# Patient Record
Sex: Female | Born: 1980 | Race: White | Hispanic: No | Marital: Married | Smoking: Never smoker
Health system: Southern US, Community
[De-identification: ages and names within clinical notes are randomized; demographics above are authoritative.]

## PROBLEM LIST (undated history)

## (undated) DIAGNOSIS — K589 Irritable bowel syndrome without diarrhea: Secondary | ICD-10-CM

## (undated) DIAGNOSIS — O00109 Unspecified tubal pregnancy without intrauterine pregnancy: Secondary | ICD-10-CM

## (undated) DIAGNOSIS — F419 Anxiety disorder, unspecified: Secondary | ICD-10-CM

## (undated) DIAGNOSIS — J45909 Unspecified asthma, uncomplicated: Secondary | ICD-10-CM

## (undated) DIAGNOSIS — M353 Polymyalgia rheumatica: Secondary | ICD-10-CM

## (undated) DIAGNOSIS — R251 Tremor, unspecified: Secondary | ICD-10-CM

## (undated) DIAGNOSIS — M3500 Sicca syndrome, unspecified: Secondary | ICD-10-CM

## (undated) DIAGNOSIS — K219 Gastro-esophageal reflux disease without esophagitis: Secondary | ICD-10-CM

## (undated) DIAGNOSIS — E063 Autoimmune thyroiditis: Secondary | ICD-10-CM

## (undated) DIAGNOSIS — Z9109 Other allergy status, other than to drugs and biological substances: Secondary | ICD-10-CM

## (undated) DIAGNOSIS — M7918 Myalgia, other site: Secondary | ICD-10-CM

## (undated) HISTORY — PX: OTHER SURGICAL HISTORY: SHX169

## (undated) HISTORY — DX: Sjogren syndrome, unspecified: M35.00

## (undated) HISTORY — DX: Myalgia, other site: M79.18

## (undated) HISTORY — DX: Autoimmune thyroiditis: E06.3

## (undated) HISTORY — DX: Other allergy status, other than to drugs and biological substances: Z91.09

## (undated) HISTORY — DX: Unspecified tubal pregnancy without intrauterine pregnancy: O00.109

## (undated) HISTORY — DX: Polymyalgia rheumatica: M35.3

## (undated) HISTORY — DX: Unspecified asthma, uncomplicated: J45.909

## (undated) HISTORY — DX: Tremor, unspecified: R25.1

## (undated) HISTORY — PX: CHOLECYSTECTOMY: SHX55

## (undated) HISTORY — DX: Irritable bowel syndrome, unspecified: K58.9

---

## 2012-01-16 ENCOUNTER — Encounter (INDEPENDENT_AMBULATORY_CARE_PROVIDER_SITE_OTHER): Payer: Self-pay | Admitting: *Deleted

## 2012-01-31 ENCOUNTER — Encounter (INDEPENDENT_AMBULATORY_CARE_PROVIDER_SITE_OTHER): Payer: Self-pay | Admitting: *Deleted

## 2012-01-31 ENCOUNTER — Encounter (INDEPENDENT_AMBULATORY_CARE_PROVIDER_SITE_OTHER): Payer: Self-pay | Admitting: Internal Medicine

## 2012-01-31 ENCOUNTER — Telehealth (INDEPENDENT_AMBULATORY_CARE_PROVIDER_SITE_OTHER): Payer: Self-pay | Admitting: *Deleted

## 2012-01-31 ENCOUNTER — Other Ambulatory Visit (INDEPENDENT_AMBULATORY_CARE_PROVIDER_SITE_OTHER): Payer: Self-pay | Admitting: *Deleted

## 2012-01-31 ENCOUNTER — Ambulatory Visit (INDEPENDENT_AMBULATORY_CARE_PROVIDER_SITE_OTHER): Payer: Managed Care, Other (non HMO) | Admitting: Internal Medicine

## 2012-01-31 VITALS — BP 114/82 | HR 84 | Temp 98.0°F | Ht 67.5 in | Wt 228.8 lb

## 2012-01-31 DIAGNOSIS — D649 Anemia, unspecified: Secondary | ICD-10-CM

## 2012-01-31 DIAGNOSIS — K625 Hemorrhage of anus and rectum: Secondary | ICD-10-CM

## 2012-01-31 DIAGNOSIS — Z1211 Encounter for screening for malignant neoplasm of colon: Secondary | ICD-10-CM

## 2012-01-31 DIAGNOSIS — J302 Other seasonal allergic rhinitis: Secondary | ICD-10-CM | POA: Insufficient documentation

## 2012-01-31 MED ORDER — PEG-KCL-NACL-NASULF-NA ASC-C 100 G PO SOLR: 1.0000 | Freq: Once | ORAL | Status: DC

## 2012-01-31 NOTE — Patient Instructions (Addendum)
Colonoscopy.  The risks and benefits such as perforation, bleeding, and infection were reviewed with the patient and is agreeable. 

## 2012-01-31 NOTE — Progress Notes (Signed)
Subjective:     Patient ID: Maureen Yates, female   DOB: Apr 19, 1981, 31 y.o.   MRN: [id number]  HPICallie is 31yrold female referred to our office by Dr SManuella Ghazifor rectal bleeding. She had rectal bleeding a month ago. She bled off and on for 4-5 days.  [facility].  Per [facility] records she had a small laceration approx. 7 o'clock at the anal opening.   After than she would see a small amount on the toilet tissue. Stools are usually loose. She usually has a BM about 3-4 a day.  After she eats she will have to go and have a BM.  She has seen her Allergra in her stool undisolved.   Appetite is good. No weight loss. Occasionally epigastric pain and bloating.   Hx of anemia back in 2008 and was corrected with Iron.  LMP 01/22/2012. Periods are not heavy. 12/26/2011: H and H 10.1 and 33.0, MCV 74.  Review of Systems see hpi Current Outpatient Prescriptions  Medication Sig Dispense Refill  . desogestrel-ethinyl estradiol (KARIVA,AZURETTE,MIRCETTE) 0.15-0.02/0.01 MG (21/5) tablet Take 1 tablet by mouth daily.      . ferrous sulfate 325 (65 FE) MG EC tablet Take 325 mg by mouth 3 (three) times daily with meals.      . fexofenadine (ALLEGRA) 180 MG tablet Take 180 mg by mouth daily.      .Marland KitchenFLUoxetine (PROZAC) 20 MG capsule Take 30 mg by mouth daily.      . pantoprazole (PROTONIX) 40 MG tablet Take 40 mg by mouth daily.       Past Medical History  Diagnosis Date  . Environmental allergies    History   Social History  . Marital Status: Married    Spouse Name: N/A    Number of Children: N/A  . Years of Education: N/A   Occupational History  . Not on file.   Social History Main Topics  . Smoking status: Never Smoker   . Smokeless tobacco: Not on file  . Alcohol Use: No  . Drug Use: No  . Sexually Active: Not on file   Other Topics Concern  . Not on file   Social History Narrative  . No narrative on file   Family Status  Relation  Status Death Age  . Mother Alive     good health  . Father Deceased     copd  . Brother Alive     good health   Allergies  Allergen Reactions  . Augmentin (Amoxicillin-Pot Clavulanate)   . Casein         Objective:   Physical Exam Filed Vitals:   01/31/12 1100  Height: 5' 7.5" (1.715 m)  Weight: 228 lb 12.8 oz (103.783 kg)   Alert and oriented. Skin warm and dry. Oral mucosa is moist.   . Sclera anicteric, conjunctivae is pink. Thyroid not enlarged. No cervical lymphadenopathy. Lungs clear. Heart regular rate and rhythm.  Abdomen is soft. Bowel sounds are positive. No hepatomegaly. No abdominal masses felt. Epigastric tenderness slight. Stool brown and guaiac negative. No edema to lower extremities.       Filed Vitals:   01/31/12 1100  Height: 5' 7.5" (1.715 m)  Weight: 228 lb 12.8 oz (103.783 kg)   Current Outpatient Prescriptions  Medication Sig Dispense Refill  . desogestrel-ethinyl estradiol (KARIVA,AZURETTE,MIRCETTE) 0.15-0.02/0.01 MG (21/5) tablet Take 1 tablet by mouth daily.      . ferrous sulfate 325 (65  FE) MG EC tablet Take 325 mg by mouth 3 (three) times daily with meals.      . fexofenadine (ALLEGRA) 180 MG tablet Take 180 mg by mouth daily.      Marland Kitchen FLUoxetine (PROZAC) 20 MG capsule Take 30 mg by mouth daily.      . pantoprazole (PROTONIX) 40 MG tablet Take 40 mg by mouth daily.            Assessment:   Iron deficiency  anemia. Rectal bleeding from a small anal laceration resolved. I discussed this case with Dr. Laural Golden. Colonic neoplasm, polyp, AVM need to be ruled out.    Plan:    Colonoscopy.The risks and benefits such as perforation, bleeding, and infection were reviewed with the patient and is agreeable.

## 2012-01-31 NOTE — Telephone Encounter (Signed)
Patient needs movi prep 

## 2012-02-04 ENCOUNTER — Encounter (INDEPENDENT_AMBULATORY_CARE_PROVIDER_SITE_OTHER): Payer: Self-pay | Admitting: Internal Medicine

## 2012-02-13 ENCOUNTER — Encounter (HOSPITAL_COMMUNITY): Payer: Self-pay | Admitting: Pharmacy Technician

## 2012-02-19 ENCOUNTER — Encounter (INDEPENDENT_AMBULATORY_CARE_PROVIDER_SITE_OTHER): Payer: Self-pay

## 2012-02-27 ENCOUNTER — Encounter (HOSPITAL_COMMUNITY): Payer: Self-pay | Admitting: *Deleted

## 2012-02-27 ENCOUNTER — Encounter (HOSPITAL_COMMUNITY)
Admission: RE | Disposition: A | Discharge status: Home / Self Care | Payer: Self-pay | Source: Ambulatory Visit | Attending: Internal Medicine

## 2012-02-27 ENCOUNTER — Ambulatory Visit (HOSPITAL_COMMUNITY)
Admission: RE | Admit: 2012-02-27 | Discharge: 2012-02-27 | Disposition: A | Discharge status: Home / Self Care | Payer: Managed Care, Other (non HMO) | Source: Ambulatory Visit | Attending: Internal Medicine | Admitting: Internal Medicine

## 2012-02-27 DIAGNOSIS — R197 Diarrhea, unspecified: Secondary | ICD-10-CM

## 2012-02-27 DIAGNOSIS — K644 Residual hemorrhoidal skin tags: Secondary | ICD-10-CM | POA: Insufficient documentation

## 2012-02-27 DIAGNOSIS — K625 Hemorrhage of anus and rectum: Secondary | ICD-10-CM

## 2012-02-27 DIAGNOSIS — D649 Anemia, unspecified: Secondary | ICD-10-CM

## 2012-02-27 HISTORY — PX: COLONOSCOPY: SHX5424

## 2012-02-27 HISTORY — DX: Anxiety disorder, unspecified: F41.9

## 2012-02-27 HISTORY — DX: Gastro-esophageal reflux disease without esophagitis: K21.9

## 2012-02-27 SURGERY — COLONOSCOPY
Anesthesia: Moderate Sedation

## 2012-02-27 MED ORDER — HYOSCYAMINE SULFATE ER 0.375 MG PO TB12: 0.3750 mg | ORAL_TABLET | Freq: Every day | ORAL | Status: DC

## 2012-02-27 MED ORDER — MIDAZOLAM HCL 5 MG/5ML IJ SOLN
INTRAMUSCULAR | Status: AC
  Filled 2012-02-27: qty 10

## 2012-02-27 MED ORDER — MEPERIDINE HCL 50 MG/ML IJ SOLN
INTRAMUSCULAR | Status: DC | PRN
  Administered 2012-02-27 (×2): 25 mg via INTRAVENOUS

## 2012-02-27 MED ORDER — MEPERIDINE HCL 50 MG/ML IJ SOLN
INTRAMUSCULAR | Status: AC
  Filled 2012-02-27: qty 1

## 2012-02-27 MED ORDER — SODIUM CHLORIDE 0.45 % IV SOLN
Freq: Once | INTRAVENOUS | Status: AC
  Administered 2012-02-27: 14:00:00 via INTRAVENOUS

## 2012-02-27 MED ORDER — MIDAZOLAM HCL 5 MG/5ML IJ SOLN
INTRAMUSCULAR | Status: DC | PRN
  Administered 2012-02-27 (×5): 2 mg via INTRAVENOUS

## 2012-02-27 MED ORDER — STERILE WATER FOR IRRIGATION IR SOLN
Status: DC | PRN
  Administered 2012-02-27: 14:00:00

## 2012-02-27 NOTE — H&P (Signed)
Maureen Yates is an 31 y.o. female.   Chief Complaint: Patient is here for colonoscopy. HPI: Patient is 31 year old Caucasian female who had 4-5 day spell of rectal bleeding about 8 weeks ago. She was felt to have anal tear. Rectal bleeding has stopped. However she has chronic diarrhea average frequency of 4-5 stools per day. Whenever states she has as many as 8 stools. Stools are loose. He also experiences lower abdominal pain or cramps. Her appetite is fair. She denies involuntary weight loss. She has a niece with ulcerative colitis.  Past Medical History  Diagnosis Date  . Environmental allergies   . GERD (gastroesophageal reflux disease)   . Anxiety     Past Surgical History  Procedure Date  . Rt ovary      removed 03/2010 for 4 pound tumor  . Cholecystectomy Sept of 2012 gallstones    History reviewed. No pertinent family history. Social History:  reports that she has never smoked. She does not have any smokeless tobacco history on file. She reports that she does not drink alcohol or use illicit drugs.  Allergies:  Allergies  Allergen Reactions  . Casein Other (See Comments)    G.I. Upset  . Augmentin (Amoxicillin-Pot Clavulanate) Swelling and Rash    Medications Prior to Admission  Medication Sig Dispense Refill  . acetaminophen (TYLENOL) 500 MG tablet Take 500-1,000 mg by mouth every 6 (six) hours as needed. Pain      . desogestrel-ethinyl estradiol (KARIVA,AZURETTE,MIRCETTE) 0.15-0.02/0.01 MG (21/5) tablet Take 1 tablet by mouth daily.      . fexofenadine (ALLEGRA) 180 MG tablet Take 180 mg by mouth daily.      . fluconazole (DIFLUCAN) 150 MG tablet Take 150 mg by mouth once a week.      Marland Kitchen FLUoxetine (PROZAC) 10 MG capsule Take 30 mg by mouth daily.      . pantoprazole (PROTONIX) 40 MG tablet Take 40 mg by mouth daily.      . peg 3350 powder (MOVIPREP) 100 G SOLR Take 1 kit (100 g total) by mouth once.  1 kit  0    No results found for this or any previous visit  (from the past 48 hour(s)). No results found.  ROS  Blood pressure 141/78, temperature 97.8 F (36.6 C), temperature source Oral, resp. rate 22, height 5' 8"  (1.727 m), weight 218 lb (98.884 kg), last menstrual period 02/21/2012, SpO2 97.00%. Physical Exam  Constitutional: She appears well-developed and well-nourished.  HENT:  Mouth/Throat: Oropharynx is clear and moist.  Eyes: Conjunctivae are normal. No scleral icterus.  Neck: No thyromegaly present.  Cardiovascular: Normal rate, regular rhythm and normal heart sounds.   No murmur heard. Respiratory: Effort normal and breath sounds normal.  GI: Soft. She exhibits no mass. There is Tenderness: mild generalized tenderness most pronounced at LLQ.Marland Kitchen There is no rebound.  Musculoskeletal: She exhibits no edema.  Lymphadenopathy:    She has no cervical adenopathy.  Neurological: She is alert.  Skin: Skin is warm and dry.     Assessment/Plan Chronic diarrhea. Recent episode or rectal bleeding. Diagnostic colonoscopy.  Maureen Yates U 02/27/2012, 2:18 PM

## 2012-02-27 NOTE — Op Note (Signed)
COLONOSCOPY PROCEDURE REPORT  PATIENT:  Maureen Yates  MR#:  [id number] Birthdate:  1981-06-19, 30 y.o., female Endoscopist:  Dr. Rogene Houston, MD Referred By:  Ms. Tarri Fuller, NP Procedure Date: 02/27/2012  Procedure:   Colonoscopy  Indications:  Patient is 31 year old Caucasian female with chronic diarrhea who experienced 4-5 day period of rectal bleeding few weeks ago. She is undergoing diagnostic colonoscopy.  Informed Consent:  The procedure and risks were reviewed with the patient and informed consent was obtained.  Medications:  Demerol 50 mg IV Versed 10 mg IV  Description of procedure:  After a digital rectal exam was performed, that colonoscope was advanced from the anus through the rectum and colon to the area of the cecum, ileocecal valve and appendiceal orifice. The cecum was deeply intubated. These structures were well-seen and photographed for the record. From the level of the cecum and ileocecal valve, the scope was slowly and cautiously withdrawn. The mucosal surfaces were carefully surveyed utilizing scope tip to flexion to facilitate fold flattening as needed. The scope was pulled down into the rectum where a thorough exam including retroflexion was performed. Terminal ileum was also examined.  Findings:   Prep excellent. Normal terminal ileum. Nirmal mucosa of colon throughout. Normal rectal mucosa. Small hemorrhoids below the dentate line.   Therapeutic/Diagnostic Maneuvers Performed:  Random biopsies taken from normal appearing mucosa of sigmoid colon.  Complications:  None  Cecal Withdrawal Time:  9 minutes  Impression:  Normal terminal ileum and normal colonoscopy except external hemorrhoids. Random biopsies taken from mucosa of sigmoid colon looking for microscopic colitis. Suspect chronic diarrhea secondary to IBS. She may have bled from hemorrhoids or self-limiting colitis.  Recommendations:  Standard instructions given. Levbid 1 tablet  by mouth every morning. I will be contacting patient with biopsy results and further recommendations.  [name] U  02/27/2012 2:51 PM  CC: Ms. Tarri Fuller, NP.

## 2012-02-29 ENCOUNTER — Encounter (HOSPITAL_COMMUNITY): Payer: Self-pay | Admitting: Internal Medicine

## 2012-03-12 ENCOUNTER — Encounter (INDEPENDENT_AMBULATORY_CARE_PROVIDER_SITE_OTHER): Payer: Self-pay | Admitting: *Deleted

## 2012-03-13 NOTE — Progress Notes (Signed)
Apt has been scheduled for 04/03/12 at 9:30 am with Deberah Castle, NP.

## 2012-04-03 ENCOUNTER — Ambulatory Visit (INDEPENDENT_AMBULATORY_CARE_PROVIDER_SITE_OTHER): Payer: Managed Care, Other (non HMO) | Admitting: Internal Medicine

## 2012-04-03 ENCOUNTER — Encounter (INDEPENDENT_AMBULATORY_CARE_PROVIDER_SITE_OTHER): Payer: Self-pay | Admitting: Internal Medicine

## 2012-04-03 VITALS — BP 126/74 | HR 80 | Temp 98.2°F | Ht 67.5 in | Wt 234.0 lb

## 2012-04-03 DIAGNOSIS — K589 Irritable bowel syndrome without diarrhea: Secondary | ICD-10-CM | POA: Insufficient documentation

## 2012-04-03 NOTE — Patient Instructions (Addendum)
Levsin BID. OV on a prn basis.

## 2012-04-03 NOTE — Progress Notes (Signed)
Subjective:     Patient ID: Maureen Yates, female   DOB: [birth year], 31 y.o.   MRN: [id number]  HPIHere today for f/u. She says she feels better. The Levsin has helped. She says she is not 100% but it is much better. There is no urgency now. She is having 3-5 stools a day. No rectal bleeding. Hx of anemia back in 2008 and was corrected with Iron.  12/26/2011: H and H 10.1 and 33.0, MCV 74.     She will have muscle spasm at times since the colonoscopy.   Appetite is good. No weight loss. BMs are loose, with some form. Stools are not liquidity like they were.    02/27/2012 Colonoscopy:Impression:  Normal terminal ileum and normal colonoscopy except external hemorrhoids.  Random biopsies taken from mucosa of sigmoid colon looking for microscopic colitis.  Suspect chronic diarrhea secondary to IBS.  She may have bled from hemorrhoids or self-limiting colitis.  Biopsy: Benign colonic mucosa. No microscopic colitis. Active inflammation or granulomas.    Review of Systems see hpi Current Outpatient Prescriptions  Medication Sig Dispense Refill  . acetaminophen (TYLENOL) 500 MG tablet Take 500-1,000 mg by mouth every 6 (six) hours as needed. Pain      . fexofenadine (ALLEGRA) 180 MG tablet Take 180 mg by mouth daily.      . fluconazole (DIFLUCAN) 150 MG tablet Take 150 mg by mouth once a week.      Marland Kitchen FLUoxetine (PROZAC) 10 MG capsule Take 30 mg by mouth daily.      . hyoscyamine (LEVBID) 0.375 MG 12 hr tablet Take 1 tablet (0.375 mg total) by mouth daily.  30 tablet  5  . pantoprazole (PROTONIX) 40 MG tablet Take 40 mg by mouth daily.       Past Medical History  Diagnosis Date  . Environmental allergies   . GERD (gastroesophageal reflux disease)   . Anxiety    History   Social History  . Marital Status: Married    Spouse Name: N/A    Number of Children: N/A  . Years of Education: N/A   Occupational History  . Not on file.   Social History Main Topics  . Smoking status:  Never Smoker   . Smokeless tobacco: Not on file  . Alcohol Use: No  . Drug Use: No  . Sexually Active: Not on file   Other Topics Concern  . Not on file   Social History Narrative  . No narrative on file   Family Status  Relation Status Death Age  . Mother Alive     good health  . Father Deceased     copd  . Brother Alive     good health   Allergies  Allergen Reactions  . Casein Other (See Comments)    G.I. Upset  . Augmentin (Amoxicillin-Pot Clavulanate) Swelling and Rash       Objective:   Physical Exam Filed Vitals:   04/03/12 0937  BP: 126/74  Pulse: 80  Temp: 98.2 F (36.8 C)  Height: 5' 7.5" (1.715 m)  Weight: 234 lb (106.142 kg)   Alert and oriented. Skin warm and dry. Oral mucosa is moist.   . Sclera anicteric, conjunctivae is pink. Thyroid not enlarged. No cervical lymphadenopathy. Lungs clear. Heart regular rate and rhythm.  Abdomen is soft. Bowel sounds are positive. No hepatomegaly. No abdominal masses felt. No tenderness.  No edema to lower extremities. Patient is alert and oriented.     Assessment:  Probably IBS.  Biopsy did not show UC or microscopic colitis. Symptoms better since starting the Levsin..   Plan:     Increase Levsin to twice a day. May follow up on a prn basis.

## 2012-04-21 ENCOUNTER — Encounter (INDEPENDENT_AMBULATORY_CARE_PROVIDER_SITE_OTHER): Payer: Self-pay

## 2012-05-14 DIAGNOSIS — D509 Iron deficiency anemia, unspecified: Secondary | ICD-10-CM

## 2012-05-22 DIAGNOSIS — D509 Iron deficiency anemia, unspecified: Secondary | ICD-10-CM

## 2012-05-27 ENCOUNTER — Encounter: Payer: Managed Care, Other (non HMO) | Admitting: Internal Medicine

## 2012-05-27 DIAGNOSIS — D509 Iron deficiency anemia, unspecified: Secondary | ICD-10-CM

## 2012-05-27 DIAGNOSIS — M79609 Pain in unspecified limb: Secondary | ICD-10-CM

## 2012-05-29 DIAGNOSIS — D72829 Elevated white blood cell count, unspecified: Secondary | ICD-10-CM

## 2012-05-29 DIAGNOSIS — D509 Iron deficiency anemia, unspecified: Secondary | ICD-10-CM

## 2012-06-25 DIAGNOSIS — D72829 Elevated white blood cell count, unspecified: Secondary | ICD-10-CM

## 2012-06-25 DIAGNOSIS — K219 Gastro-esophageal reflux disease without esophagitis: Secondary | ICD-10-CM

## 2012-06-25 DIAGNOSIS — D509 Iron deficiency anemia, unspecified: Secondary | ICD-10-CM

## 2012-07-24 LAB — US OB TRANSVAGINAL

## 2012-07-24 LAB — US OB COMP LESS 14 WKS

## 2012-10-01 DIAGNOSIS — D509 Iron deficiency anemia, unspecified: Secondary | ICD-10-CM

## 2012-11-27 ENCOUNTER — Encounter: Payer: Managed Care, Other (non HMO) | Admitting: Internal Medicine

## 2012-11-27 DIAGNOSIS — D509 Iron deficiency anemia, unspecified: Secondary | ICD-10-CM

## 2012-12-08 DIAGNOSIS — D509 Iron deficiency anemia, unspecified: Secondary | ICD-10-CM

## 2012-12-15 DIAGNOSIS — D509 Iron deficiency anemia, unspecified: Secondary | ICD-10-CM

## 2013-01-01 LAB — US OB COMP + 14 WK

## 2013-04-08 DIAGNOSIS — D72829 Elevated white blood cell count, unspecified: Secondary | ICD-10-CM

## 2013-04-08 DIAGNOSIS — D509 Iron deficiency anemia, unspecified: Secondary | ICD-10-CM

## 2013-04-08 DIAGNOSIS — Z23 Encounter for immunization: Secondary | ICD-10-CM

## 2013-04-08 HISTORY — PX: TUBAL LIGATION: SHX77

## 2013-06-30 DIAGNOSIS — L659 Nonscarring hair loss, unspecified: Secondary | ICD-10-CM | POA: Insufficient documentation

## 2013-07-08 ENCOUNTER — Encounter (HOSPITAL_COMMUNITY): Payer: Self-pay

## 2013-07-08 ENCOUNTER — Ambulatory Visit (HOSPITAL_COMMUNITY): Payer: Managed Care, Other (non HMO)

## 2013-07-08 ENCOUNTER — Encounter (HOSPITAL_COMMUNITY): Payer: Managed Care, Other (non HMO) | Attending: Hematology and Oncology

## 2013-07-08 VITALS — BP 120/57 | HR 77 | Temp 98.0°F | Resp 18 | Ht 68.0 in | Wt 243.0 lb

## 2013-07-08 DIAGNOSIS — D509 Iron deficiency anemia, unspecified: Secondary | ICD-10-CM

## 2013-07-08 DIAGNOSIS — D270 Benign neoplasm of right ovary: Secondary | ICD-10-CM

## 2013-07-08 DIAGNOSIS — D649 Anemia, unspecified: Secondary | ICD-10-CM | POA: Insufficient documentation

## 2013-07-08 DIAGNOSIS — N83209 Unspecified ovarian cyst, unspecified side: Secondary | ICD-10-CM

## 2013-07-08 DIAGNOSIS — K909 Intestinal malabsorption, unspecified: Secondary | ICD-10-CM

## 2013-07-08 LAB — COMPREHENSIVE METABOLIC PANEL
Albumin: 4 g/dL (ref 3.5–5.2)
Alkaline Phosphatase: 97 U/L (ref 39–117)
BUN: 10 mg/dL (ref 6–23)
CO2: 25 mEq/L (ref 19–32)
Chloride: 99 mEq/L (ref 96–112)
GFR calc non Af Amer: >90 mL/min (ref >90)
Potassium: 3.9 mEq/L (ref 3.7–5.3)
Total Bilirubin: 0.2 mg/dL — ABNORMAL LOW (ref 0.3–1.2)

## 2013-07-08 LAB — CBC WITH DIFFERENTIAL/PLATELET
Eosinophils Absolute: 0.4 10*3/uL (ref 0.0–0.7)
Eosinophils Relative: 3 % (ref 0–5)
HCT: 36.3 % (ref 36.0–46.0)
Hemoglobin: 12.1 g/dL (ref 12.0–15.0)
Lymphs Abs: 2.2 10*3/uL (ref 0.7–4.0)
MCH: 29.9 pg (ref 26.0–34.0)
MCHC: 33.3 g/dL (ref 30.0–36.0)
MCV: 89.6 fL (ref 78.0–100.0)
Monocytes Absolute: 0.6 10*3/uL (ref 0.1–1.0)
Monocytes Relative: 5 % (ref 3–12)
Neutrophils Relative %: 72 % (ref 43–77)
RBC: 4.05 MIL/uL (ref 3.87–5.11)

## 2013-07-08 LAB — IRON AND TIBC
Saturation Ratios: 12 % — ABNORMAL LOW (ref 20–55)
TIBC: 310 ug/dL (ref 250–470)

## 2013-07-08 LAB — RETICULOCYTES: RBC.: 4.05 MIL/uL (ref 3.87–5.11)

## 2013-07-08 LAB — LACTATE DEHYDROGENASE: LDH: 191 U/L (ref 94–250)

## 2013-07-08 LAB — FERRITIN: Ferritin: 130 ng/mL (ref 10–291)

## 2013-07-08 NOTE — Progress Notes (Signed)
Milbank A. Barnet Glasgow, M.D.  NEW PATIENT EVALUATION   Name: Maureen Yates Date: 07/08/2013 MRN: [id number] DOB: [birth year]  PCP: [name]   REFERRING PHYSICIAN: [name], NP  REASON FOR REFERRAL: Anemia     HISTORY OF PRESENT ILLNESS:Maureen Yates is a 32 y.o. female who is referred via her nurse practitioner for followup and continuation of management of anemia having been seen in the past at another [facility]. She suffers with chronic diarrhea and avoids dairy products because of casein sensitivity. 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 The tumor had grown during her pregnancy. There was no evidence of malignancy. She was pregnant in 2014 and has delivered a healthy child. She's had night sweats in addition to joint pain and bone discomfort. Her hair is falling out. She did get iron infusion in June of 2014. Over the past 6-8 weeks she's been experiencing these symptoms noticeably. She is also excessively fatigued which is not unusual unusual since she has a newborn. 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 According to the patient she has never had an upper endoscopy and does not know whether she's been evaluated for celiac disease. She has had colonoscopy  revealing only hemorrhoids.   PAST MEDICAL HISTORY:  has a past medical history of Environmental allergies; GERD (gastroesophageal reflux disease); Anxiety; and IBS (irritable bowel syndrome).     PAST SURGICAL HISTORY: Past Surgical History  Procedure Laterality Date  . Rt ovary       removed 03/2010 for 4 pound tumor  . Cholecystectomy  Sept of 2012 gallstones  . Colonoscopy  02/27/2012    Procedure: COLONOSCOPY;  Surgeon: Rogene Houston, MD;  Location: [facility];  Service: Endoscopy;  Laterality: N/A;  225  . Tubal ligation Bilateral 04/2013      CURRENT MEDICATIONS: has a current medication list which includes the following prescription(s): acetaminophen, fexofenadine, fluoxetine, and hyoscyamine.   ALLERGIES: Casein and Augmentin   SOCIAL HISTORY:  reports that she has never smoked. She does not have any smokeless tobacco history on file. She reports that she does not drink alcohol or use illicit drugs.   FAMILY HISTORY: family history includes Epilepsy in her brother and mother; Lupus in her brother.    REVIEW OF SYSTEMS:  Other than that discussed above is noncontributory.    PHYSICAL EXAM:  height is 5' 8"  (1.727 m) and weight is 243 lb (110.224 kg). Her oral temperature is 98 F (36.7 C). Her blood pressure is 120/57 and her pulse is 77. Her respiration is 18.    GENERAL:alert, no distress and comfortable. Moderately obese SKIN: skin color, texture, turgor are normal, no rashes or significant lesions EYES: normal, Conjunctiva are pink and non-injected, sclera clear OROPHARYNX:no exudate, no erythema and lips, buccal mucosa, and tongue normal. No evidence of cheilitis. NECK: supple, thyroid normal size, non-tender, without nodularity CHEST: Normal AP diameter with no breast masses. LYMPH:  no palpable lymphadenopathy in the cervical, axillary or inguinal LUNGS: clear to auscultation and percussion with normal breathing effort HEART: regular rate & rhythm and no murmurs ABDOMEN:abdomen soft, non-tender and normal bowel sounds MUSCULOSKELETALl:no cyanosis of digits, no clubbing or edema  NEURO: alert & oriented x 3 with fluent speech, no focal motor/sensory deficits    LABORATORY DATA:  Office Visit on 07/08/2013  Component Date Value Range Status  . WBC 07/08/2013 11.2* 4.0 - 10.5 K/uL Final  . RBC 07/08/2013 4.05  3.87 - 5.11 MIL/uL Final  . Hemoglobin 07/08/2013 12.1  12.0 - 15.0 g/dL Final  . HCT 07/08/2013 36.3  36.0 - 46.0 % Final  . MCV 07/08/2013 89.6  78.0 - 100.0 fL Final  . MCH 07/08/2013 29.9   26.0 - 34.0 pg Final  . MCHC 07/08/2013 33.3  30.0 - 36.0 g/dL Final  . RDW 07/08/2013 14.8  11.5 - 15.5 % Final  . Platelets 07/08/2013 383  150 - 400 K/uL Final  . Neutrophils Relative % 07/08/2013 72  43 - 77 % Final  . Neutro Abs 07/08/2013 8.0* 1.7 - 7.7 K/uL Final  . Lymphocytes Relative 07/08/2013 20  12 - 46 % Final  . Lymphs Abs 07/08/2013 2.2  0.7 - 4.0 K/uL Final  . Monocytes Relative 07/08/2013 5  3 - 12 % Final  . Monocytes Absolute 07/08/2013 0.6  0.1 - 1.0 K/uL Final  . Eosinophils Relative 07/08/2013 3  0 - 5 % Final  . Eosinophils Absolute 07/08/2013 0.4  0.0 - 0.7 K/uL Final  . Basophils Relative 07/08/2013 0  0 - 1 % Final  . Basophils Absolute 07/08/2013 0.0  0.0 - 0.1 K/uL Final  . Retic Ct Pct 07/08/2013 3.2* 0.4 - 3.1 % Final  . RBC. 07/08/2013 4.05  3.87 - 5.11 MIL/uL Final  . Retic Count, Manual 07/08/2013 129.6  19.0 - 186.0 K/uL Final  . Sodium 07/08/2013 139  137 - 147 mEq/L Final   Please note change in reference range.  . Potassium 07/08/2013 3.9  3.7 - 5.3 mEq/L Final   Please note change in reference range.  . Chloride 07/08/2013 99  96 - 112 mEq/L Final  . CO2 07/08/2013 25  19 - 32 mEq/L Final  . Glucose, Bld 07/08/2013 82  70 - 99 mg/dL Final  . BUN 07/08/2013 10  6 - 23 mg/dL Final  . Creatinine, Ser 07/08/2013 0.58  0.50 - 1.10 mg/dL Final  . Calcium 07/08/2013 9.0  8.4 - 10.5 mg/dL Final  . Total Protein 07/08/2013 7.6  6.0 - 8.3 g/dL Final  . Albumin 07/08/2013 4.0  3.5 - 5.2 g/dL Final  . AST 07/08/2013 22  0 - 37 U/L Final  . ALT 07/08/2013 34  0 - 35 U/L Final  . Alkaline Phosphatase 07/08/2013 97  39 - 117 U/L Final  . Total Bilirubin 07/08/2013 0.2* 0.3 - 1.2 mg/dL Final  . GFR calc non Af Amer 07/08/2013 >90  >90 mL/min Final  . GFR calc Af Amer 07/08/2013 >90  >90 mL/min Final   Comment: (NOTE)                          The eGFR has been calculated using the CKD EPI equation.                          This calculation has not been  validated in all clinical situations.                          eGFR's persistently <90 mL/min signify possible Chronic Kidney                          Disease.  Marland Kitchen LDH 07/08/2013 191  94 -  250 U/L Final   Last hemoglobin done 06/11/2013 was 11.8. Ferritin was not done.  Urinalysis No results found for this basename: colorurine,  appearanceur,  labspec,  phurine,  glucoseu,  hgbur,  bilirubinur,  ketonesur,  proteinur,  urobilinogen,  nitrite,  leukocytesur      @RADIOGRAPHY : No results found.  PATHOLOGY: Ovarian tumor pathology will be reviewed.   IMPRESSION:  #1. 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 #2. 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 #3. Status post resection of right ovarian cystic tumor, pathology to be reviewed. No postop therapy was recommended, having occurred during pregnancy and resected a few months after delivery.  PLAN:  #1. Repeat CBC with ferritin, B12, folate, and celiac panel. #2. If ferritin is low, will treat with intravenous iron since there is considerable literature to support the fact that hypoferritinemia even in the presence of an adequate hemoglobin produces symptomatology not unlike that described by the patient today. #3. Office visit with CBC and ferritin in 3 weeks.  Appreciate the opportunity sharing in her care.   Doroteo Bradford, MD 07/08/2013 2:25 PM

## 2013-07-08 NOTE — Patient Instructions (Signed)
[facility] Discharge Instructions  RECOMMENDATIONS MADE BY THE CONSULTANT AND ANY TEST RESULTS WILL BE SENT TO YOUR REFERRING PHYSICIAN.  Lab work today.  We will call you Friday to let you know results. We will schedule an iron infusion if needed as soon as we can. Return to clinic in 3 weeks for follow up.  Thank you for choosing [facility] to provide your oncology and hematology care.  To afford each patient quality time with our providers, please arrive at least 15 minutes before your scheduled appointment time.  With your help, our goal is to use those 15 minutes to complete the necessary work-up to ensure our physicians have the information they need to help with your evaluation and healthcare recommendations.    Effective January 1st, 2014, we ask that you re-schedule your appointment with our physicians should you arrive 10 or more minutes late for your appointment.  We strive to give you quality time with our providers, and arriving late affects you and other patients whose appointments are after yours.    Again, thank you for choosing [facility].  Our hope is that these requests will decrease the amount of time that you wait before being seen by our physicians.       _____________________________________________________________  Should you have questions after your visit to [facility], please contact our office at [phone] between the hours of 8:30 a.m. and 5:00 p.m.  Voicemails left after 4:30 p.m. will not be returned until the following business day.  For prescription refill requests, have your pharmacy contact our office with your prescription refill request.

## 2013-07-09 HISTORY — PX: OTHER SURGICAL HISTORY: SHX169

## 2013-07-09 NOTE — Progress Notes (Signed)
error 

## 2013-07-10 ENCOUNTER — Encounter (HOSPITAL_COMMUNITY): Payer: Managed Care, Other (non HMO) | Attending: Hematology and Oncology

## 2013-07-10 ENCOUNTER — Telehealth (HOSPITAL_COMMUNITY): Payer: Self-pay | Admitting: Hematology and Oncology

## 2013-07-10 ENCOUNTER — Encounter (HOSPITAL_COMMUNITY): Payer: Self-pay | Admitting: Hematology and Oncology

## 2013-07-10 ENCOUNTER — Other Ambulatory Visit (HOSPITAL_COMMUNITY): Payer: Self-pay | Admitting: Hematology and Oncology

## 2013-07-10 VITALS — BP 123/74 | HR 72 | Temp 98.0°F | Resp 18

## 2013-07-10 DIAGNOSIS — K900 Celiac disease: Secondary | ICD-10-CM | POA: Insufficient documentation

## 2013-07-10 DIAGNOSIS — D270 Benign neoplasm of right ovary: Secondary | ICD-10-CM | POA: Insufficient documentation

## 2013-07-10 DIAGNOSIS — D649 Anemia, unspecified: Secondary | ICD-10-CM | POA: Insufficient documentation

## 2013-07-10 DIAGNOSIS — D509 Iron deficiency anemia, unspecified: Secondary | ICD-10-CM

## 2013-07-10 LAB — GLIADIN ANTIBODIES, SERUM
Gliadin IgA: 3.5 U/mL (ref <20)
Gliadin IgG: 77.7 U/mL — ABNORMAL HIGH (ref <20)

## 2013-07-10 LAB — TISSUE TRANSGLUTAMINASE, IGA: Tissue Transglutaminase Ab, IgA: 2.9 U/mL (ref <20)

## 2013-07-10 LAB — RETICULIN ANTIBODIES, IGA W TITER: Reticulin Ab, IgA: NEGATIVE

## 2013-07-10 LAB — ANA: Anti Nuclear Antibody(ANA): NEGATIVE

## 2013-07-10 LAB — ERYTHROPOIETIN: Erythropoietin: 37.3 m[IU]/mL — ABNORMAL HIGH (ref 2.6–18.5)

## 2013-07-10 MED ORDER — SODIUM CHLORIDE 0.9 % IJ SOLN: 10.0000 mL | INTRAMUSCULAR | Status: DC | PRN

## 2013-07-10 MED ORDER — SODIUM CHLORIDE 0.9 % IV SOLN
Freq: Once | INTRAVENOUS | Status: AC
  Administered 2013-07-10: 11:00:00 via INTRAVENOUS

## 2013-07-10 MED ORDER — FERUMOXYTOL INJECTION 510 MG/17 ML
510.0000 mg | Freq: Once | INTRAVENOUS | Status: AC
  Administered 2013-07-10: 510 mg via INTRAVENOUS
  Filled 2013-07-10: qty 17

## 2013-07-10 NOTE — Progress Notes (Signed)
Antigliadin antibody was elevated at 77. IgA at the [facility] antibody was negative. The patient was told to call today and told to concentrate on healing a gluten-free diet before her next visit. She came in today received 510 mg of intravenous Feraheme. 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

## 2013-07-10 NOTE — Telephone Encounter (Signed)
See telephone notation

## 2013-07-10 NOTE — Progress Notes (Signed)
.  Maureen Yates arrives today for iron infusion. See MAR for administration. Tolerated well.

## 2013-07-21 ENCOUNTER — Telehealth (HOSPITAL_COMMUNITY): Payer: Self-pay | Admitting: Hematology and Oncology

## 2013-07-21 NOTE — Telephone Encounter (Signed)
PC TO [employer] ?'D IF CPT Q0138 REQUIRES AUTH PER BERAL AUTH IS NOT REQUIRED 1000 DED 3000 OOP 80/20 CALL REF [id number]

## 2013-07-29 ENCOUNTER — Encounter (HOSPITAL_COMMUNITY): Payer: Self-pay

## 2013-07-29 ENCOUNTER — Encounter (HOSPITAL_BASED_OUTPATIENT_CLINIC_OR_DEPARTMENT_OTHER): Payer: Managed Care, Other (non HMO)

## 2013-07-29 VITALS — BP 121/85 | HR 77 | Temp 98.0°F | Resp 18 | Wt 239.5 lb

## 2013-07-29 DIAGNOSIS — K589 Irritable bowel syndrome without diarrhea: Secondary | ICD-10-CM

## 2013-07-29 DIAGNOSIS — K900 Celiac disease: Secondary | ICD-10-CM

## 2013-07-29 DIAGNOSIS — D509 Iron deficiency anemia, unspecified: Secondary | ICD-10-CM

## 2013-07-29 DIAGNOSIS — D649 Anemia, unspecified: Secondary | ICD-10-CM

## 2013-07-29 MED ORDER — DICYCLOMINE HCL 10 MG PO CAPS: ORAL_CAPSULE | ORAL | Status: DC

## 2013-07-29 NOTE — Patient Instructions (Signed)
[facility] Discharge Instructions  RECOMMENDATIONS MADE BY THE CONSULTANT AND ANY TEST RESULTS WILL BE SENT TO YOUR REFERRING PHYSICIAN.  A prescription for Bentyl has been sent to your pharmacy. Take as directed. A referral has been sent for a dietician consult. They will call you to schedule an appointment. Let us know if you have not heard from them in 1 week. Dr.Formanek ask that you call to let us know how you are doing in a couple of weeks. Lab work in 2 months prior to MD appointment.  Thank you for choosing [facility] to provide your oncology and hematology care.  To afford each patient quality time with our providers, please arrive at least 15 minutes before your scheduled appointment time.  With your help, our goal is to use those 15 minutes to complete the necessary work-up to ensure our physicians have the information they need to help with your evaluation and healthcare recommendations.    Effective January 1st, 2014, we ask that you re-schedule your appointment with our physicians should you arrive 10 or more minutes late for your appointment.  We strive to give you quality time with our providers, and arriving late affects you and other patients whose appointments are after yours.    Again, thank you for choosing [facility].  Our hope is that these requests will decrease the amount of time that you wait before being seen by our physicians.       _____________________________________________________________  Should you have questions after your visit to [facility], please contact our office at [phone] between the hours of 8:30 a.m. and 5:00 p.m.  Voicemails left after 4:30 p.m. will not be returned until the following business day.  For prescription refill requests, have your pharmacy contact our office with your prescription refill request.

## 2013-07-29 NOTE — Progress Notes (Signed)
[facility]  OFFICE PROGRESS NOTE  [name] [address] [place] [address]  DIAGNOSIS: Anemia - Plan: CBC with Differential, Ferritin  Adult celiac disease - Plan: CBC with Differential, Ferritin  Chief Complaint  Patient presents with  . 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  She's had increasing abdominal spasms since assuming a gluten-free diet. She has had a previous history of irritable bowel syndrome. She denies any fever, night sweats, but has had occasional nausea associated with eating. She denies any melena, hematochezia, and has had diarrhea but less propulsive. She denies any lower extremity swelling or redness. She denies any cough, wheezing, vaginal bleeding, dysuria, hematuria, skin rash, headache, or seizures.  MEDICAL HISTORY: Past Medical History  Diagnosis Date  . Environmental allergies   . GERD (gastroesophageal reflux disease)   . Anxiety   . IBS (irritable bowel syndrome)     INTERIM HISTORY: has Seasonal allergies; Anemia; IBS (irritable bowel syndrome); Adult celiac disease; and Cystadenoma of right ovary on her problem list.    ALLERGIES:  is allergic to casein and augmentin.  MEDICATIONS: has a current medication list which includes the following prescription(s): acetaminophen, fexofenadine, fluoxetine, dicyclomine, and hyoscyamine.  SURGICAL HISTORY:  Past Surgical History  Procedure Laterality Date  . Rt ovary       removed 03/2010 for 4 pound tumor  . Cholecystectomy  Sept of 2012 gallstones  . Colonoscopy  02/27/2012    Procedure: COLONOSCOPY;  Surgeon: Rogene Houston, MD;  Location: [facility];   Service: Endoscopy;  Laterality: N/A;  225  . Tubal ligation Bilateral 04/2013    FAMILY HISTORY: family history includes Epilepsy in her brother and mother; Lupus in her brother.  SOCIAL HISTORY:  reports that she has never smoked. She does not have any smokeless tobacco history on file. She reports that she does not drink alcohol or use illicit drugs.  REVIEW OF SYSTEMS:  Other than that discussed above is noncontributory.  PHYSICAL EXAMINATION: ECOG PERFORMANCE STATUS: 1 - Symptomatic but completely ambulatory  Blood pressure 121/85, pulse 77, temperature 98 F (36.7 C), temperature source Oral, resp. rate 18, weight 239 lb 8 oz (108.636 kg).  GENERAL:alert, no distress and comfortable SKIN: skin color, texture, turgor are normal, no rashes or significant lesions EYES: PERLA; Conjunctiva are pink and non-injected, sclera clear OROPHARYNX:no exudate, no erythema on lips, buccal mucosa, or tongue. NECK: supple, thyroid normal size, non-tender, without nodularity. No masses CHEST: No breast masses. LYMPH:  no palpable lymphadenopathy in the cervical, axillary or inguinal LUNGS: clear to auscultation and percussion with normal breathing effort HEART: regular rate & rhythm and no murmurs. ABDOMEN:abdomen soft, non-tender and hyperactive bowel sounds with no rebound tenderness or distention. MUSCULOSKELETAL:no cyanosis of digits and no clubbing. Range of motion normal.  NEURO: alert & oriented x 3 with fluent speech, no focal motor/sensory deficits   LABORATORY DATA: Office Visit on 07/08/2013  Component Date Value Range Status  . WBC 07/08/2013 11.2* 4.0 - 10.5 K/uL Final  . RBC 07/08/2013 4.05  3.87 - 5.11 MIL/uL Final  . Hemoglobin 07/08/2013 12.1  12.0 - 15.0 g/dL Final  . HCT 07/08/2013 36.3  36.0 - 46.0 % Final  . MCV 07/08/2013 89.6  78.0 - 100.0 fL Final  . MCH 07/08/2013 29.9  26.0 - 34.0 pg Final  . MCHC 07/08/2013 33.3  30.0 - 36.0 g/dL Final  . RDW 07/08/2013 14.8   11.5 - 15.5 % Final  . Platelets 07/08/2013 383  150 - 400 K/uL Final  . Neutrophils Relative % 07/08/2013 72  43 - 77 % Final  . Neutro Abs 07/08/2013 8.0* 1.7 - 7.7 K/uL Final  . Lymphocytes Relative 07/08/2013 20  12 - 46 % Final  . Lymphs Abs 07/08/2013 2.2  0.7 - 4.0 K/uL Final  . Monocytes Relative 07/08/2013 5  3 - 12 % Final  . Monocytes Absolute 07/08/2013 0.6  0.1 - 1.0 K/uL Final  . Eosinophils Relative 07/08/2013 3  0 - 5 % Final  . Eosinophils Absolute 07/08/2013 0.4  0.0 - 0.7 K/uL Final  . Basophils Relative 07/08/2013 0  0 - 1 % Final  . Basophils Absolute 07/08/2013 0.0  0.0 - 0.1 K/uL Final  . Retic Ct Pct 07/08/2013 3.2* 0.4 - 3.1 % Final  . RBC. 07/08/2013 4.05  3.87 - 5.11 MIL/uL Final  . Retic Count, Manual 07/08/2013 129.6  19.0 - 186.0 K/uL Final  . Sodium 07/08/2013 139  137 - 147 mEq/L Final   Please note change in reference range.  . Potassium 07/08/2013 3.9  3.7 - 5.3 mEq/L Final   Please note change in reference range.  . Chloride 07/08/2013 99  96 - 112 mEq/L Final  . CO2 07/08/2013 25  19 - 32 mEq/L Final  . Glucose, Bld 07/08/2013 82  70 - 99 mg/dL Final  . BUN 07/08/2013 10  6 - 23 mg/dL Final  . Creatinine, Ser 07/08/2013 0.58  0.50 - 1.10 mg/dL Final  . Calcium 07/08/2013 9.0  8.4 - 10.5 mg/dL Final  . Total Protein 07/08/2013 7.6  6.0 - 8.3 g/dL Final  . Albumin 07/08/2013 4.0  3.5 - 5.2 g/dL Final  . AST 07/08/2013 22  0 - 37 U/L Final  . ALT 07/08/2013 34  0 - 35 U/L Final  . Alkaline Phosphatase 07/08/2013 97  39 - 117 U/L Final  . Total Bilirubin 07/08/2013 0.2* 0.3 - 1.2 mg/dL Final  . GFR calc non Af Amer 07/08/2013 >90  >90 mL/min Final  . GFR calc Af Amer 07/08/2013 >90  >90 mL/min Final   Comment: (NOTE)                          The eGFR has been calculated using the CKD EPI equation.                          This calculation has not been validated in all clinical situations.                          eGFR's persistently <90 mL/min  signify possible Chronic Kidney                          Disease.  Marland Kitchen LDH 07/08/2013 191  94 - 250 U/L Final  . Vitamin B-12 07/08/2013 630  211 - 911 pg/mL Final   Performed at [facility]  . Folate 07/08/2013 >20.0   Final   Comment: (NOTE)  Reference Ranges                                 Deficient:       0.4 - 3.3 ng/mL                                 Indeterminate:   3.4 - 5.4 ng/mL                                 Normal:              > 5.4 ng/mL                          Performed at [facility]  . Iron 07/08/2013 36* 42 - 135 ug/dL Final  . TIBC 07/08/2013 310  250 - 470 ug/dL Final  . Saturation Ratios 07/08/2013 12* 20 - 55 % Final  . UIBC 07/08/2013 274  125 - 400 ug/dL Final   Performed at [facility]  . Ferritin 07/08/2013 130  10 - 291 ng/mL Final   Performed at [facility]  . ANA 07/08/2013 NEGATIVE  NEGATIVE Final   Performed at [facility]  . Erythropoietin 07/08/2013 37.3* 2.6 - 18.5 mIU/mL Final   Comment: (NOTE)                          Because of diurnal variations in Erythropoietin levels, it is                          important to collect samples at a consistent time of day.  Morning                          samples collected between 7:30 AM and 12:00 noon are recommended.                          Performed at [facility]  . Gliadin IgG 07/08/2013 77.7* <20 U/mL Final   Comment: (NOTE)                            Reference Range:                                 <20     Negative                                 20-25   Equivocal                                 >25     Positive  . Gliadin IgA 07/08/2013 3.5  <20 U/mL Final   Comment: (NOTE)                            Reference Range:                                 <  20     Negative                                 20-25   Equivocal                                 >25     Positive                          Performed at [facility]  . Tissue Transglutaminase Ab, IgA 07/08/2013 2.9  <20 U/mL Final   Comment: (NOTE)                            Reference Range:                                 <20     Negative                                 20-25   Equivocal                                 >25     Positive                          Between 2-3% of Celiac patients have selective IgA deficiency. If the                          tTG IgA result is negative but Celiac disease is still suspected,                          total IgA should be measured to identify possible selective IgA                          deficiency and to rule out a false negative. In cases of IgA                          deficiency, measurement of tTG IgG should be considered.                          Performed at [facility]  . Reticulin Ab, IgA 07/08/2013 NEGATIVE  NEGATIVE Final  . Reticulin IgA titer 07/08/2013 Titer not indicated.  <1:2.5 Final   Performed at [facility]: No new pathology antigliadin antibodies positive IgG  Urinalysis No results found for this basename: colorurine,  appearanceur,  labspec,  phurine,  glucoseu,  hgbur,  bilirubinur,  ketonesur,  proteinur,  urobilinogen,  nitrite,  leukocytesur    RADIOGRAPHIC STUDIES: No results found.  ASSESSMENT:  #1. Celiac disease, currently on casein free and gluten-free diet with worsening intestinal spasms. #2. Irritable bowel syndrome.  #3. Iron deficiency anemia, corrected by intravenous iron in June 2014. Etiology is probably a combination of blood loss and malabsorption.   PLAN:  #  1. Dicyclomine 10-20 mg every 12 hours to help with intestinal spasm. #2. Dietitian consultation for gluten-free casein free diet. #3. Followup in 2 months with CBC and ferritin. Patient was told to call in 2 weeks regarding symptomatology.   All questions were answered. The patient knows to call the clinic with any problems, questions or concerns. We can  certainly see the patient much sooner if necessary.   I spent 25 minutes counseling the patient face to face. The total time spent in the appointment was 30 minutes.    Doroteo Bradford, MD 07/29/2013 2:30 PM

## 2013-08-04 ENCOUNTER — Encounter (HOSPITAL_COMMUNITY): Payer: Self-pay | Admitting: *Deleted

## 2013-08-04 ENCOUNTER — Telehealth (HOSPITAL_COMMUNITY): Payer: Self-pay | Admitting: Dietician

## 2013-08-04 NOTE — Telephone Encounter (Signed)
Called at 1524. Appointment scheduled for 08/12/13 at 1400.

## 2013-08-04 NOTE — Telephone Encounter (Signed)
Message copied by Domenick Bookbinder on Tue Aug 04, 2013  3:21 PM ------      Message from: Renato Battles F      Created: Tue Aug 04, 2013  2:34 PM      Regarding: Dietary Consult       Anderson Malta,       Patient has Celiac Disease and needs consult on Gluten and [place] Free Diets...      Thanks,      [employer]       ------

## 2013-08-12 ENCOUNTER — Encounter (HOSPITAL_COMMUNITY): Payer: Self-pay | Admitting: Dietician

## 2013-08-12 NOTE — Progress Notes (Signed)
Outpatient Initial Nutrition Assessment  Date:08/12/2013   Appt Start Time: 1607  Referring Physician: Bush Reason for Visit: celiac disease, gluten free diet  Nutrition Assessment:  Height: 5' 8"  (172.7 cm)   Weight: 238 lb (107.956 kg)   IBW: 140#  %IBW: 170% UBW: 218#  %UBW: 109% Body mass index is 36.2 kg/(m^2).  Goal Weight: 214# (10% loss of current wt Weight hx: Wt Readings from Last 10 Encounters:  07/29/13 239 lb 8 oz (108.636 kg)  07/08/13 243 lb (110.224 kg)  04/03/12 234 lb (106.142 kg)  02/27/12 218 lb (98.884 kg)  02/27/12 218 lb (98.884 kg)  01/31/12 228 lb 12.8 oz (103.783 kg)    Estimated nutritional needs:  Kcals/ day: 2100-2300  Protein (grams)/day: 86-108 Fluid (L)/ day: 2.1-2.3  PMH:  Past Medical History  Diagnosis Date  . Environmental allergies   . GERD (gastroesophageal reflux disease)   . Anxiety   . IBS (irritable bowel syndrome)     Medications:  Current Outpatient Rx  Name  Route  Sig  Dispense  Refill  . acetaminophen (TYLENOL) 500 MG tablet   Oral   Take 500-1,000 mg by mouth every 6 (six) hours as needed. Pain         . dicyclomine (BENTYL) 10 MG capsule      Take one or two tablets every 12 hours for intestinal spasm.   60 capsule   6   . fexofenadine (ALLEGRA) 180 MG tablet   Oral   Take 180 mg by mouth daily.         Marland Kitchen FLUoxetine (PROZAC) 10 MG capsule   Oral   Take 30 mg by mouth daily.         Marland Kitchen EXPIRED: hyoscyamine (LEVBID) 0.375 MG 12 hr tablet   Oral   Take 1 tablet (0.375 mg total) by mouth daily.   30 tablet   5     Labs: CMP     Component Value Date/Time   NA 139 07/08/2013 1330   K 3.9 07/08/2013 1330   CL 99 07/08/2013 1330   CO2 25 07/08/2013 1330   GLUCOSE 82 07/08/2013 1330   BUN 10 07/08/2013 1330   CREATININE 0.58 07/08/2013 1330   CALCIUM 9.0 07/08/2013 1330   PROT 7.6 07/08/2013 1330   ALBUMIN 4.0 07/08/2013 1330   AST 22 07/08/2013 1330   ALT 34 07/08/2013 1330   ALKPHOS 97 07/08/2013 1330   BILITOT 0.2* 07/08/2013 1330   GFRNONAA >90 07/08/2013 1330   GFRAA >90 07/08/2013 1330    Lipid Panel  No results found for this basename: chol, trig, hdl, cholhdl, vldl, ldlcalc     No results found for this basename: HGBA1C   Lab Results  Component Value Date   CREATININE 0.58 07/08/2013     Lifestyle/ social habits: Ms. Molyneux resides in [place] with her husband and 2 children (ages 58 and 5 months). She is self-employed as a Theme park manager. Physical activity is limited, but she is contemplating increasing activity by walking and participating in [place]. She would like to contain a healthy weight. She has heard about the [place] and wants to know if it is gluten free.   Nutrition hx/habits: Ms. Vargus was diagnosed with celiac disease about 1 month ago. SHe reports she has struggled with GI symptoms since she was 33 years old. She denies any family members with celiac disease, but suspects her father had it, as he has 13 inches of his small intestine  removed due to possible Crohn's disease. He passed away many years ago at age 6.  She reports she has had a casein allergy for many years and is accustomed to these restrictions. Additionally, her son has severe nut allergies, so these are also avoided. She is very experienced at reading labels due to allergies. She is interested in treating her celiac disease so her intestines can heal and so she can receive iron injections less frequently. She has been buying gluten free specialty products and organic meats.    Nutrition Diagnosis: Nutrition-related knowledge deficit r/t gluten free diet AEB recent dx of celiac disease.   Nutrition Intervention: Nutrition rx: 1600-1700 Kcal gluten free diet; 30 minutes physical activity daily  Education/Counseling Provided: Educated pt on gluten diet. Discussed pathophysiology of celiac disease and expressed importance of dietary compliance due to diet being only  treatment for celiac disease. Reviewed how to read labels to find hidden sources of gluten. Discussed risks of cross contamination. Discussed ways to comply with a gluten free diet using both gluten free foods and conventional foods (fruits, vegetables, meat, dairy, starches, etc). Teachback method used. Provided "Celaic Disease Nutrition Therapy" and "Gluten Free Diet" handout from [facility] Nutrition Care Manual. Offered Casein allergy handout, but she declined.   Understanding, Motivation, Ability to Follow Recommendations: Expect good compliance.   Monitoring and Evaluation: Goals: 1) 0.2-5# wt loss per week; 2) Physical activity 30 minutes daily  Recommendations: 1) Continue to read labels for sources of gluten; 2) Recommend whole family follow diet to decrease cross contamination; 3) Limit eating out and be careful of foods with many preservatives and additives; 4) Continue to work steadily toward weight loss- work towards 7-10% loss of current wt  F/U: PRN. RD contact information provided  Skylur Fuston A. Jimmye Norman, RD, LDN 08/12/2013  Appt EndTime: 9924

## 2013-09-28 ENCOUNTER — Encounter (HOSPITAL_COMMUNITY): Payer: Managed Care, Other (non HMO) | Attending: Hematology and Oncology

## 2013-09-28 DIAGNOSIS — K900 Celiac disease: Secondary | ICD-10-CM | POA: Insufficient documentation

## 2013-09-28 DIAGNOSIS — K9089 Other intestinal malabsorption: Secondary | ICD-10-CM | POA: Insufficient documentation

## 2013-09-28 DIAGNOSIS — D649 Anemia, unspecified: Secondary | ICD-10-CM | POA: Insufficient documentation

## 2013-09-28 LAB — FERRITIN: FERRITIN: 219 ng/mL (ref 10–291)

## 2013-09-28 LAB — CBC WITH DIFFERENTIAL/PLATELET
BASOS PCT: 0 % (ref 0–1)
Basophils Absolute: 0 10*3/uL (ref 0.0–0.1)
Eosinophils Absolute: 0.2 10*3/uL (ref 0.0–0.7)
Eosinophils Relative: 2 % (ref 0–5)
HCT: 39.5 % (ref 36.0–46.0)
HEMOGLOBIN: 13.2 g/dL (ref 12.0–15.0)
Lymphocytes Relative: 16 % (ref 12–46)
Lymphs Abs: 1.6 10*3/uL (ref 0.7–4.0)
MCH: 30.3 pg (ref 26.0–34.0)
MCHC: 33.4 g/dL (ref 30.0–36.0)
MCV: 90.6 fL (ref 78.0–100.0)
Monocytes Absolute: 0.3 10*3/uL (ref 0.1–1.0)
Monocytes Relative: 3 % (ref 3–12)
NEUTROS ABS: 7.6 10*3/uL (ref 1.7–7.7)
NEUTROS PCT: 78 % — AB (ref 43–77)
Platelets: 442 10*3/uL — ABNORMAL HIGH (ref 150–400)
RBC: 4.36 MIL/uL (ref 3.87–5.11)
RDW: 14 % (ref 11.5–15.5)
WBC: 9.8 10*3/uL (ref 4.0–10.5)

## 2013-09-28 NOTE — Progress Notes (Signed)
Labs drawn today for cbc/diff,ferr

## 2013-09-30 ENCOUNTER — Encounter (HOSPITAL_BASED_OUTPATIENT_CLINIC_OR_DEPARTMENT_OTHER): Payer: Managed Care, Other (non HMO)

## 2013-09-30 ENCOUNTER — Encounter (HOSPITAL_COMMUNITY): Payer: Self-pay

## 2013-09-30 VITALS — BP 112/83 | HR 76 | Temp 98.2°F | Resp 18 | Wt 242.9 lb

## 2013-09-30 DIAGNOSIS — K900 Celiac disease: Secondary | ICD-10-CM

## 2013-09-30 DIAGNOSIS — K909 Intestinal malabsorption, unspecified: Secondary | ICD-10-CM

## 2013-09-30 DIAGNOSIS — D649 Anemia, unspecified: Secondary | ICD-10-CM

## 2013-09-30 NOTE — Patient Instructions (Signed)
[facility] Discharge Instructions  RECOMMENDATIONS MADE BY THE CONSULTANT AND ANY TEST RESULTS WILL BE SENT TO YOUR REFERRING PHYSICIAN.  We will see you in 6 months for follow up.  Thank you for choosing [facility] to provide your oncology and hematology care.  To afford each patient quality time with our providers, please arrive at least 15 minutes before your scheduled appointment time.  With your help, our goal is to use those 15 minutes to complete the necessary work-up to ensure our physicians have the information they need to help with your evaluation and healthcare recommendations.    Effective January 1st, 2014, we ask that you re-schedule your appointment with our physicians should you arrive 10 or more minutes late for your appointment.  We strive to give you quality time with our providers, and arriving late affects you and other patients whose appointments are after yours.    Again, thank you for choosing [facility].  Our hope is that these requests will decrease the amount of time that you wait before being seen by our physicians.       _____________________________________________________________  Should you have questions after your visit to [facility], please contact our office at [phone] between the hours of 8:30 a.m. and 5:00 p.m.  Voicemails left after 4:30 p.m. will not be returned until the following business day.  For prescription refill requests, have your pharmacy contact our office with your prescription refill request.

## 2013-09-30 NOTE — Progress Notes (Signed)
Clovis Cao presented for labwork. Labs per MD order drawn via Peripheral Line 23 gauge needle inserted in left AC.   Good blood return present. Procedure without incident.  Needle removed intact. Patient tolerated procedure well.

## 2013-09-30 NOTE — Progress Notes (Signed)
[facility]  OFFICE PROGRESS NOTE  [name] [address] [place] [address]  DIAGNOSIS: Malabsorption of iron - Plan: CBC with Differential, Ferritin, Gliadin Antibodies, Serum  Anemia - Plan: CBC with Differential, Ferritin, Gliadin Antibodies, Serum  Adult celiac disease - Plan: Gliadin Antibodies, Serum, Gliadin Antibodies, Serum, CBC with Differential, Ferritin, Gliadin Antibodies, Serum  Chief Complaint  Patient presents with  . Iron deficiency  . Nontropical sprue    CURRENT THERAPY: 510 mg Feraheme on 07/10/2013+ gluten-free diet  INTERVAL HISTORY: Maureen Yates 33 y.o. female returns for followup of iron deficiency in association with celiac disease. She is adhering to a gluten-free diet and has had vaginal improvement in her bowel movements. There are no longer watery but still frequent. She denies abdominal pain, nausea, vomiting, or headache. She denies any cough, chest pain, PND, orthopnea, palpitations, sore throat, earache, skin rash, but has had some joint discomfort particularly at night for the last 3 weeks. Last menstrual period lasted 4 days, started on 09/08/2013.  MEDICAL HISTORY: Past Medical History  Diagnosis Date  . Environmental allergies   . GERD (gastroesophageal reflux disease)   . Anxiety   . IBS (irritable bowel syndrome)     INTERIM HISTORY: has Seasonal allergies; Anemia; IBS (irritable bowel syndrome); Adult celiac disease; and Cystadenoma of right ovary on her problem list.    ALLERGIES:  is allergic to casein and augmentin.  MEDICATIONS: has a current medication list which includes the following prescription(s): acetaminophen, dicyclomine, fexofenadine, fluoxetine, and hyoscyamine.  SURGICAL HISTORY:  Past Surgical History  Procedure Laterality Date  . Rt ovary       removed 03/2010 for 4 pound tumor  . Cholecystectomy  Sept of 2012 gallstones  . Colonoscopy  02/27/2012   Procedure: COLONOSCOPY;  Surgeon: Rogene Houston, MD;  [facility];  Service: Endoscopy;  Laterality: N/A;  225  . Tubal ligation Bilateral 04/2013    FAMILY HISTORY: family history includes Epilepsy in her brother and mother; Lupus in her brother.  SOCIAL HISTORY:  reports that she has never smoked. She does not have any smokeless tobacco history on file. She reports that she does not drink alcohol or use illicit drugs.  REVIEW OF SYSTEMS:  Other than that discussed above is noncontributory.  PHYSICAL EXAMINATION: ECOG PERFORMANCE STATUS: 1 - Symptomatic but completely ambulatory  Blood pressure 112/83, pulse 76, temperature 98.2 F (36.8 C), temperature source Oral, resp. rate 18, weight 242 lb 14.4 oz (110.179 kg), SpO2 100.00%.  GENERAL:alert, no distress and comfortable SKIN: skin color, texture, turgor are normal, no rashes or significant lesions EYES: PERLA; Conjunctiva are pink and non-injected, sclera clear SINUSES: No redness or tenderness over maxillary or ethmoid sinuses OROPHARYNX:no exudate, no erythema on lips, buccal mucosa, or tongue. NECK: supple, thyroid normal size, non-tender, without nodularity. No masses CHEST: Normal AP diameter with no breast masses. LYMPH:  no palpable lymphadenopathy in the cervical, axillary or inguinal LUNGS: clear to auscultation and percussion with normal breathing effort HEART: regular rate & rhythm and no murmurs. ABDOMEN:abdomen soft, non-tender and normal bowel sounds. No organomegaly. MUSCULOSKELETAL:no cyanosis of digits and no clubbing. Range of motion normal.  NEURO: alert & oriented x 3 with fluent speech, no focal motor/sensory deficits   LABORATORY DATA: Infusion on 09/28/2013  Component Date Value Ref Range Status  . WBC 09/28/2013 9.8  4.0 - 10.5 K/uL Final  . RBC 09/28/2013 4.36  3.87 -  5.11 MIL/uL Final  . Hemoglobin 09/28/2013 13.2  12.0 - 15.0 g/dL Final  . HCT 09/28/2013 39.5  36.0 - 46.0 % Final  .  MCV 09/28/2013 90.6  78.0 - 100.0 fL Final  . MCH 09/28/2013 30.3  26.0 - 34.0 pg Final  . MCHC 09/28/2013 33.4  30.0 - 36.0 g/dL Final  . RDW 09/28/2013 14.0  11.5 - 15.5 % Final  . Platelets 09/28/2013 442* 150 - 400 K/uL Final  . Neutrophils Relative % 09/28/2013 78* 43 - 77 % Final  . Neutro Abs 09/28/2013 7.6  1.7 - 7.7 K/uL Final  . Lymphocytes Relative 09/28/2013 16  12 - 46 % Final  . Lymphs Abs 09/28/2013 1.6  0.7 - 4.0 K/uL Final  . Monocytes Relative 09/28/2013 3  3 - 12 % Final  . Monocytes Absolute 09/28/2013 0.3  0.1 - 1.0 K/uL Final  . Eosinophils Relative 09/28/2013 2  0 - 5 % Final  . Eosinophils Absolute 09/28/2013 0.2  0.0 - 0.7 K/uL Final  . Basophils Relative 09/28/2013 0  0 - 1 % Final  . Basophils Absolute 09/28/2013 0.0  0.0 - 0.1 K/uL Final  . Ferritin 09/28/2013 219  10 - 291 ng/mL Final   Performed at [facility]: No new pathology. Antigliadin antibody  IgG was elevated at 77 prior to initiation of gluten-free diet.  Urinalysis No results found for this basename: colorurine,  appearanceur,  labspec,  phurine,  glucoseu,  hgbur,  bilirubinur,  ketonesur,  proteinur,  urobilinogen,  nitrite,  leukocytesur    RADIOGRAPHIC STUDIES: No results found.  ASSESSMENT:  #1. Improved gastrointestinal symptoms after introduction of gluten-free diet. #2. Celiac disease, for repeat anti-Gliadin  antibody titer today. #3. Iron deficiency, corrected.   PLAN:  #1. Continue gluten-free diet. #2. Followup in 6 months with CBC, ferritin, and anti-Gliadel antibody.   All questions were answered. The patient knows to call the clinic with any problems, questions or concerns. We can certainly see the patient much sooner if necessary.   I spent 25 minutes counseling the patient face to face. The total time spent in the appointment was 30 minutes.    Doroteo Bradford, MD 09/30/2013 2:04 PM

## 2013-10-01 LAB — GLIADIN ANTIBODIES, SERUM
Gliadin IgA: 3.6 U/mL (ref <20)
Gliadin IgG: 92.5 U/mL — ABNORMAL HIGH (ref <20)

## 2014-01-01 ENCOUNTER — Encounter (HOSPITAL_COMMUNITY): Payer: Managed Care, Other (non HMO) | Attending: Hematology and Oncology

## 2014-01-01 ENCOUNTER — Other Ambulatory Visit (HOSPITAL_COMMUNITY): Payer: Self-pay | Admitting: Hematology and Oncology

## 2014-01-01 ENCOUNTER — Telehealth (HOSPITAL_COMMUNITY): Payer: Self-pay

## 2014-01-01 DIAGNOSIS — K909 Intestinal malabsorption, unspecified: Secondary | ICD-10-CM

## 2014-01-01 DIAGNOSIS — K900 Celiac disease: Secondary | ICD-10-CM

## 2014-01-01 DIAGNOSIS — K9089 Other intestinal malabsorption: Secondary | ICD-10-CM

## 2014-01-01 DIAGNOSIS — D649 Anemia, unspecified: Secondary | ICD-10-CM

## 2014-01-01 LAB — CBC WITH DIFFERENTIAL/PLATELET
Basophils Absolute: 0 10*3/uL (ref 0.0–0.1)
Basophils Relative: 0 % (ref 0–1)
Eosinophils Absolute: 0.6 10*3/uL (ref 0.0–0.7)
Eosinophils Relative: 5 % (ref 0–5)
HCT: 38 % (ref 36.0–46.0)
HEMOGLOBIN: 12.8 g/dL (ref 12.0–15.0)
Lymphocytes Relative: 18 % (ref 12–46)
Lymphs Abs: 2.1 10*3/uL (ref 0.7–4.0)
MCH: 30.1 pg (ref 26.0–34.0)
MCHC: 33.7 g/dL (ref 30.0–36.0)
MCV: 89.4 fL (ref 78.0–100.0)
MONOS PCT: 4 % (ref 3–12)
Monocytes Absolute: 0.5 10*3/uL (ref 0.1–1.0)
NEUTROS ABS: 8.6 10*3/uL — AB (ref 1.7–7.7)
NEUTROS PCT: 73 % (ref 43–77)
PLATELETS: 377 10*3/uL (ref 150–400)
RBC: 4.25 MIL/uL (ref 3.87–5.11)
RDW: 14.2 % (ref 11.5–15.5)
WBC: 11.8 10*3/uL — AB (ref 4.0–10.5)

## 2014-01-01 LAB — FERRITIN: FERRITIN: 228 ng/mL (ref 10–291)

## 2014-01-01 NOTE — Progress Notes (Signed)
LABS DRAWN FOR CBCD,FERR,GLIADIN AB

## 2014-01-01 NOTE — Telephone Encounter (Signed)
Message copied by Mellissa Kohut on Fri Jan 01, 2014  1:28 PM ------      Message from: Farrel Gobble A      Created: Fri Jan 01, 2014 11:41 AM        Please notify Ms. Townsel that her blood count is normal. We will call her on Monday if ferritin is low so that IV iron can be arranged. ------

## 2014-01-01 NOTE — Telephone Encounter (Signed)
Patient notified and verbalized understanding. 

## 2014-01-04 LAB — GLIADIN ANTIBODIES, SERUM
GLIADIN IGA: 9.2 U/mL (ref <20)
Gliadin IgG: 40.4 U/mL — ABNORMAL HIGH (ref <20)

## 2014-04-02 ENCOUNTER — Ambulatory Visit (HOSPITAL_COMMUNITY): Payer: Managed Care, Other (non HMO)

## 2014-04-27 ENCOUNTER — Other Ambulatory Visit (HOSPITAL_COMMUNITY): Payer: Managed Care, Other (non HMO)

## 2014-04-30 ENCOUNTER — Ambulatory Visit (HOSPITAL_COMMUNITY): Payer: Managed Care, Other (non HMO)

## 2014-05-10 ENCOUNTER — Encounter (HOSPITAL_COMMUNITY): Payer: Managed Care, Other (non HMO) | Attending: Oncology

## 2014-05-10 DIAGNOSIS — K900 Celiac disease: Secondary | ICD-10-CM | POA: Diagnosis not present

## 2014-05-10 DIAGNOSIS — K909 Intestinal malabsorption, unspecified: Secondary | ICD-10-CM | POA: Diagnosis not present

## 2014-05-10 DIAGNOSIS — D649 Anemia, unspecified: Secondary | ICD-10-CM

## 2014-05-10 LAB — CBC WITH DIFFERENTIAL/PLATELET
BASOS PCT: 0 % (ref 0–1)
Basophils Absolute: 0 10*3/uL (ref 0.0–0.1)
EOS ABS: 0.2 10*3/uL (ref 0.0–0.7)
Eosinophils Relative: 3 % (ref 0–5)
HCT: 36.8 % (ref 36.0–46.0)
HEMOGLOBIN: 12 g/dL (ref 12.0–15.0)
LYMPHS ABS: 1.9 10*3/uL (ref 0.7–4.0)
Lymphocytes Relative: 20 % (ref 12–46)
MCH: 29.3 pg (ref 26.0–34.0)
MCHC: 32.6 g/dL (ref 30.0–36.0)
MCV: 90 fL (ref 78.0–100.0)
MONOS PCT: 5 % (ref 3–12)
Monocytes Absolute: 0.5 10*3/uL (ref 0.1–1.0)
NEUTROS ABS: 6.7 10*3/uL (ref 1.7–7.7)
Neutrophils Relative %: 72 % (ref 43–77)
Platelets: 353 10*3/uL (ref 150–400)
RBC: 4.09 MIL/uL (ref 3.87–5.11)
RDW: 14.1 % (ref 11.5–15.5)
WBC: 9.3 10*3/uL (ref 4.0–10.5)

## 2014-05-10 LAB — FERRITIN: FERRITIN: 187 ng/mL (ref 10–291)

## 2014-05-10 NOTE — Progress Notes (Signed)
LABS FOR FERR CBCD

## 2014-05-13 ENCOUNTER — Encounter (HOSPITAL_BASED_OUTPATIENT_CLINIC_OR_DEPARTMENT_OTHER): Payer: Managed Care, Other (non HMO)

## 2014-05-13 ENCOUNTER — Encounter (HOSPITAL_COMMUNITY): Payer: Self-pay

## 2014-05-13 VITALS — BP 124/84 | HR 89 | Temp 97.7°F | Resp 18 | Wt 254.8 lb

## 2014-05-13 DIAGNOSIS — D509 Iron deficiency anemia, unspecified: Secondary | ICD-10-CM

## 2014-05-13 DIAGNOSIS — K900 Celiac disease: Secondary | ICD-10-CM

## 2014-05-13 DIAGNOSIS — K909 Intestinal malabsorption, unspecified: Secondary | ICD-10-CM

## 2014-05-13 DIAGNOSIS — D270 Benign neoplasm of right ovary: Secondary | ICD-10-CM

## 2014-05-13 DIAGNOSIS — O009 Unspecified ectopic pregnancy without intrauterine pregnancy: Secondary | ICD-10-CM | POA: Insufficient documentation

## 2014-05-13 NOTE — Patient Instructions (Signed)
[facility] Discharge Instructions  RECOMMENDATIONS MADE BY THE CONSULTANT AND ANY TEST RESULTS WILL BE SENT TO YOUR REFERRING PHYSICIAN.  EXAM FINDINGS BY THE PHYSICIAN TODAY AND SIGNS OR SYMPTOMS TO REPORT TO CLINIC OR PRIMARY PHYSICIAN: You saw Dr Barnet Glasgow today  MEDICATIONS PRESCRIBED:  No new medications  SPECIAL INSTRUCTIONS/FOLLOW-UP: Follow up in 6 months with doctors appt and blood work   Thank you for choosing [facility] to provide your oncology and hematology care.  To afford each patient quality time with our providers, please arrive at least 15 minutes before your scheduled appointment time.  With your help, our goal is to use those 15 minutes to complete the necessary work-up to ensure our physicians have the information they need to help with your evaluation and healthcare recommendations.    Effective January 1st, 2014, we ask that you re-schedule your appointment with our physicians should you arrive 10 or more minutes late for your appointment.  We strive to give you quality time with our providers, and arriving late affects you and other patients whose appointments are after yours.    Again, thank you for choosing [facility].  Our hope is that these requests will decrease the amount of time that you wait before being seen by our physicians.       _____________________________________________________________  Should you have questions after your visit to [facility], please contact our office at [phone] between the hours of 8:30 a.m. and 5:00 p.m.  Voicemails left after 4:30 p.m. will not be returned until the following business day.  For prescription refill requests, have your pharmacy contact our office with your prescription refill request.

## 2014-05-13 NOTE — Progress Notes (Signed)
Maureen Yates  OFFICE PROGRESS NOTE  Maureen Yates [address] [place] [address]  DIAGNOSIS: Malabsorption of iron - Plan: CBC with Differential, Ferritin  Adult celiac disease  Cystadenoma of right ovary  Ectopic pregnancy, 03/26/2014  Chief Complaint  Patient presents with  . iron deficiency  . Celiac Disease    CURRENT THERAPY: Feraheme on 07/10/2013+ gluten-free diet  INTERVAL HISTORY: Maureen Yates 33 y.o. female returns for follow-up of iron deficiency in association with celiac disease, status post iron infusion in January 2015, maintaining a gluten free diet.  She underwent emergency surgery on 03/26/2014 414 week ectopic pregnancy. This was performed at [facility]. She felt somewhat weak afterwards but did not need of blood transfusion. She she does not completely adhere to a gluten-free diet but knows when she doesn't. Her last pressure. Was on 04/29/2014 and lasted 5 days. She denies any cough, wheezing, sore throat, craving fries, lower extremity swelling or redness, PND, orthopnea, palpitations, headache, skin rash, or seizures.  MEDICAL HISTORY: Past Medical History  Diagnosis Date  . Environmental allergies   . GERD (gastroesophageal reflux disease)   . Anxiety   . IBS (irritable bowel syndrome)   . Tubal ectopic pregnancy     INTERIM HISTORY: has Seasonal allergies; Anemia; IBS (irritable bowel syndrome); Adult celiac disease; Cystadenoma of right ovary; and Ectopic pregnancy, 03/26/2014 on her problem list.    ALLERGIES:  is allergic to casein and augmentin.  MEDICATIONS: has a current medication list which includes the following prescription(s): acetaminophen, fexofenadine, fluoxetine, lamotrigine, and hyoscyamine.  SURGICAL HISTORY:  Past Surgical History  Procedure Laterality Date  . Rt ovary       removed 03/2010 for 4 pound tumor  . Cholecystectomy  Sept of 2012 gallstones  . Colonoscopy   02/27/2012    Procedure: COLONOSCOPY;  Surgeon: Rogene Houston, MD;  [facility];  Service: Endoscopy;  Laterality: N/A;  225  . Tubal ligation Bilateral 04/2013    FAMILY HISTORY: family history includes Epilepsy in her brother and mother; Lupus in her brother.  SOCIAL HISTORY:  reports that she has never smoked. She does not have any smokeless tobacco history on file. She reports that she does not drink alcohol or use illicit drugs.  REVIEW OF SYSTEMS:  Other than that discussed above is noncontributory.  PHYSICAL EXAMINATION: ECOG PERFORMANCE STATUS: 1 - Symptomatic but completely ambulatory  Blood pressure 124/84, pulse 89, temperature 97.7 F (36.5 C), temperature source Oral, resp. rate 18, weight 254 lb 12.8 oz (115.577 kg), SpO2 98 %.  GENERAL:alert, no distress and comfortable SKIN: skin color, texture, turgor are normal, no rashes or significant lesions EYES: PERLA; Conjunctiva are pink and non-injected, sclera clear SINUSES: No redness or tenderness over maxillary or ethmoid sinuses OROPHARYNX:no exudate, no erythema on lips, buccal mucosa, or tongue. NECK: supple, thyroid normal size, non-tender, without nodularity. No masses CHEST: normal AP diameter with no breast masses. LYMPH:  no palpable lymphadenopathy in the cervical, axillary or inguinal LUNGS: clear to auscultation and percussion with normal breathing effort HEART: regular rate & rhythm and no murmurs. ABDOMEN:abdomen soft, non-tender and normal bowel soundslaparoscopic scar is well-healed with no organomegaly, ascites, or CVA tenderness. MUSCULOSKELETAL:no cyanosis of digits and no clubbing. Range of motion normal.  NEURO: alert & oriented x 3 with fluent speech, no focal motor/sensory deficits   LABORATORY DATA: Lab on 05/10/2014  Component Date Value Ref Range Status  .  WBC 05/10/2014 9.3  4.0 - 10.5 K/uL Final  . RBC 05/10/2014 4.09  3.87 - 5.11 MIL/uL Final  . Hemoglobin 05/10/2014 12.0   12.0 - 15.0 g/dL Final  . HCT 05/10/2014 36.8  36.0 - 46.0 % Final  . MCV 05/10/2014 90.0  78.0 - 100.0 fL Final  . MCH 05/10/2014 29.3  26.0 - 34.0 pg Final  . MCHC 05/10/2014 32.6  30.0 - 36.0 g/dL Final  . RDW 05/10/2014 14.1  11.5 - 15.5 % Final  . Platelets 05/10/2014 353  150 - 400 K/uL Final  . Neutrophils Relative % 05/10/2014 72  43 - 77 % Final  . Neutro Abs 05/10/2014 6.7  1.7 - 7.7 K/uL Final  . Lymphocytes Relative 05/10/2014 20  12 - 46 % Final  . Lymphs Abs 05/10/2014 1.9  0.7 - 4.0 K/uL Final  . Monocytes Relative 05/10/2014 5  3 - 12 % Final  . Monocytes Absolute 05/10/2014 0.5  0.1 - 1.0 K/uL Final  . Eosinophils Relative 05/10/2014 3  0 - 5 % Final  . Eosinophils Absolute 05/10/2014 0.2  0.0 - 0.7 K/uL Final  . Basophils Relative 05/10/2014 0  0 - 1 % Final  . Basophils Absolute 05/10/2014 0.0  0.0 - 0.1 K/uL Final  . Ferritin 05/10/2014 187  10 - 291 ng/mL Final   Performed at [facility]    PATHOLOGY:no new pathology.  Urinalysis No results found for: COLORURINE, APPEARANCEUR, LABSPEC, PHURINE, GLUCOSEU, HGBUR, BILIRUBINUR, KETONESUR, PROTEINUR, UROBILINOGEN, NITRITE, LEUKOCYTESUR  RADIOGRAPHIC STUDIES: No results found.  ASSESSMENT:  #1. Celiac disease manifesting antigliadin antibodies, partially adhering to a gluten-free diet. #2. Iron deficiency, stable. #3. Status post ectopic pregnancy despite having had tubal ligation in the past.   PLAN:  #1. Follow-up in 6 months with CBC ferritin and try to adhere as closely as possible to gluten-free diet.   All questions were answered. The patient knows to call the clinic with any problems, questions or concerns. We can certainly see the patient much sooner if necessary.   I spent 25 minutes counseling the patient face to face. The total time spent in the appointment was 30 minutes.    Doroteo Bradford, MD 05/13/2014 10:55 AM  DISCLAIMER:  This note was dictated with voice recognition  software.  Similar sounding words can inadvertently be transcribed inaccurately and may not be corrected upon review.

## 2014-06-01 ENCOUNTER — Telehealth (HOSPITAL_COMMUNITY): Payer: Self-pay

## 2014-06-01 NOTE — Telephone Encounter (Signed)
Call from patient with complaints of increased bruising without injury for last 2 - 3 days, and increased fatigue with right leg going numb at the end of the day.  States "this is how I felt when my iron level got too low in the past and just wanted to know what to do?"

## 2014-06-01 NOTE — Telephone Encounter (Signed)
-----   Message from Louis Meckel sent at 06/01/2014 10:06 AM EST ----- Regarding: Please call Patient called and stated she is weak again and bruising again.  She is worried about it.  Thanks

## 2014-06-02 ENCOUNTER — Encounter (HOSPITAL_BASED_OUTPATIENT_CLINIC_OR_DEPARTMENT_OTHER): Payer: Managed Care, Other (non HMO)

## 2014-06-02 ENCOUNTER — Telehealth (HOSPITAL_COMMUNITY): Payer: Self-pay

## 2014-06-02 ENCOUNTER — Other Ambulatory Visit (HOSPITAL_COMMUNITY): Payer: Self-pay | Admitting: Hematology and Oncology

## 2014-06-02 DIAGNOSIS — E611 Iron deficiency: Secondary | ICD-10-CM

## 2014-06-02 DIAGNOSIS — K900 Celiac disease: Secondary | ICD-10-CM | POA: Diagnosis not present

## 2014-06-02 LAB — CBC WITH DIFFERENTIAL/PLATELET
Basophils Absolute: 0.1 10*3/uL (ref 0.0–0.1)
Basophils Relative: 1 % (ref 0–1)
Eosinophils Absolute: 0.2 10*3/uL (ref 0.0–0.7)
Eosinophils Relative: 3 % (ref 0–5)
HCT: 38.3 % (ref 36.0–46.0)
Hemoglobin: 12.4 g/dL (ref 12.0–15.0)
LYMPHS ABS: 1.8 10*3/uL (ref 0.7–4.0)
LYMPHS PCT: 27 % (ref 12–46)
MCH: 29.2 pg (ref 26.0–34.0)
MCHC: 32.4 g/dL (ref 30.0–36.0)
MCV: 90.1 fL (ref 78.0–100.0)
Monocytes Absolute: 0.5 10*3/uL (ref 0.1–1.0)
Monocytes Relative: 7 % (ref 3–12)
NEUTROS ABS: 4.2 10*3/uL (ref 1.7–7.7)
NEUTROS PCT: 63 % (ref 43–77)
PLATELETS: 383 10*3/uL (ref 150–400)
RBC: 4.25 MIL/uL (ref 3.87–5.11)
RDW: 14.4 % (ref 11.5–15.5)
WBC: 6.8 10*3/uL (ref 4.0–10.5)

## 2014-06-02 LAB — FERRITIN: Ferritin: 187 ng/mL (ref 10–291)

## 2014-06-02 NOTE — Telephone Encounter (Signed)
Patient notified and will be here at 10:50 this am.

## 2014-06-02 NOTE — Telephone Encounter (Signed)
Message left on patient's voicemail.  To call back with questions.

## 2014-06-02 NOTE — Telephone Encounter (Signed)
-----   Message from Farrel Gobble, MD sent at 06/02/2014 12:05 PM EST ----- Please call patient and tell her that her blood count is better than it was at the time of her last visit. I would assume that ferritin is also within normal limits but that will not be known until Monday.

## 2014-06-02 NOTE — Telephone Encounter (Signed)
-----   Message from Farrel Gobble, MD sent at 06/02/2014  7:29 AM EST ----- Patient can come in to check her CBC and ferritin. Tests ordered for today.

## 2014-06-02 NOTE — Progress Notes (Signed)
LABS FOR CBCD,FERR

## 2014-06-08 ENCOUNTER — Ambulatory Visit (HOSPITAL_COMMUNITY): Payer: Managed Care, Other (non HMO)

## 2014-08-20 DIAGNOSIS — D509 Iron deficiency anemia, unspecified: Secondary | ICD-10-CM | POA: Insufficient documentation

## 2014-08-20 DIAGNOSIS — F411 Generalized anxiety disorder: Secondary | ICD-10-CM | POA: Insufficient documentation

## 2014-11-11 ENCOUNTER — Encounter (HOSPITAL_COMMUNITY): Payer: Self-pay | Admitting: Hematology & Oncology

## 2014-11-11 ENCOUNTER — Encounter (HOSPITAL_COMMUNITY): Payer: Managed Care, Other (non HMO) | Attending: Hematology & Oncology | Admitting: Hematology & Oncology

## 2014-11-11 ENCOUNTER — Encounter (HOSPITAL_COMMUNITY): Payer: Managed Care, Other (non HMO)

## 2014-11-11 VITALS — BP 121/77 | HR 83 | Temp 97.7°F | Resp 16 | Wt 260.3 lb

## 2014-11-11 DIAGNOSIS — K900 Celiac disease: Secondary | ICD-10-CM | POA: Diagnosis present

## 2014-11-11 DIAGNOSIS — K909 Intestinal malabsorption, unspecified: Secondary | ICD-10-CM

## 2014-11-11 DIAGNOSIS — R5383 Other fatigue: Secondary | ICD-10-CM

## 2014-11-11 DIAGNOSIS — D509 Iron deficiency anemia, unspecified: Secondary | ICD-10-CM | POA: Diagnosis not present

## 2014-11-11 LAB — CBC WITH DIFFERENTIAL/PLATELET
Basophils Absolute: 0 10*3/uL (ref 0.0–0.1)
Basophils Relative: 0 % (ref 0–1)
EOS PCT: 0 % (ref 0–5)
Eosinophils Absolute: 0 10*3/uL (ref 0.0–0.7)
HEMATOCRIT: 37.9 % (ref 36.0–46.0)
HEMOGLOBIN: 12.2 g/dL (ref 12.0–15.0)
LYMPHS ABS: 2.3 10*3/uL (ref 0.7–4.0)
Lymphocytes Relative: 13 % (ref 12–46)
MCH: 28.7 pg (ref 26.0–34.0)
MCHC: 32.2 g/dL (ref 30.0–36.0)
MCV: 89.2 fL (ref 78.0–100.0)
MONOS PCT: 3 % (ref 3–12)
Monocytes Absolute: 0.5 10*3/uL (ref 0.1–1.0)
Neutro Abs: 14.2 10*3/uL — ABNORMAL HIGH (ref 1.7–7.7)
Neutrophils Relative %: 84 % — ABNORMAL HIGH (ref 43–77)
Platelets: 422 10*3/uL — ABNORMAL HIGH (ref 150–400)
RBC: 4.25 MIL/uL (ref 3.87–5.11)
RDW: 14.8 % (ref 11.5–15.5)
WBC: 17 10*3/uL — ABNORMAL HIGH (ref 4.0–10.5)

## 2014-11-11 LAB — FERRITIN: FERRITIN: 120 ng/mL (ref 11–307)

## 2014-11-11 LAB — SEDIMENTATION RATE: SED RATE: 20 mm/h (ref 0–22)

## 2014-11-11 NOTE — Progress Notes (Addendum)
Maureen Yates [address] [place] [address]  DIAGNOSIS:  Celiac Sprue.  Iron deficiency.  Gliadin IgG 92.5 units/mL 09/30/2013, tissue transglutaminase Ab IgA negative, Gliadin IgA negative  CURRENT THERAPY:IV iron prn  INTERVAL HISTORY: Maureen Yates 34 y.o. female returns for Celiac Sprue and iron deficiency . She states she was diagnosed with celiac sprue by Dr. Stephenie Acres.  She has seen Dr. Laural Golden in the past because of a family history of crohns disease. She is still trying to adopt a Gluten free diet, working on it. Having a hard time because she is already a picky eater.  She was started on prednisone last week, reported waking up unable to move because of back pain and was given the Prednisone. Reports diarrhea, "30 minute window" after eating Gluten.   No one else in family with Celiac Sprue. Grandmother has Graves disease. Great Grandfather had colon cancer. Elenor Legato had ovarian cancer. Father, deceased, had COPD  and Crohn's disease. Niece has ulcerative colitis.   She denies excessive fatigue. She complains of joint pain. She denies pagophagia.   MEDICAL HISTORY: Past Medical History  Diagnosis Date  . Environmental allergies   . GERD (gastroesophageal reflux disease)   . Anxiety   . IBS (irritable bowel syndrome)   . Tubal ectopic pregnancy     has Seasonal allergies; Anemia; IBS (irritable bowel syndrome); Adult celiac disease; Cystadenoma of right ovary; and Ectopic pregnancy, 03/26/2014 on her problem list.     is allergic to penicillins; casein; and augmentin.  Maureen Yates does not currently have medications on file.  SURGICAL HISTORY: Past Surgical History  Procedure Laterality Date  . Rt ovary       removed 03/2010 for 4 pound tumor  . Cholecystectomy  Sept of 2012 gallstones  . Colonoscopy  02/27/2012    Procedure: COLONOSCOPY;  Surgeon: Rogene Houston, MD;  [facility];  Service: Endoscopy;  Laterality: N/A;  225  . Tubal  ligation Bilateral 04/2013    SOCIAL HISTORY: History   Social History  . Marital Status: Married    Spouse Name: N/A  . Number of Children: N/A  . Years of Education: N/A   Occupational History  . Not on file.   Social History Main Topics  . Smoking status: Never Smoker   . Smokeless tobacco: Not on file  . Alcohol Use: No  . Drug Use: No  . Sexual Activity: Yes   Other Topics Concern  . Not on file   Social History Narrative    FAMILY HISTORY: Family History  Problem Relation Age of Onset  . Epilepsy Mother   . Epilepsy Brother   . Lupus Brother     Review of Systems  Gastrointestinal: Positive for diarrhea. Negative for constipation.       Diarrhea after eating Gluten.  14 point review of systems was performed and is negative except as detailed under history of present illness   PHYSICAL EXAMINATION  ECOG PERFORMANCE STATUS: 0 - Asymptomatic  Filed Vitals:   11/11/14 1045  BP: 121/77  Pulse: 83  Temp: 97.7 F (36.5 C)  Resp: 16    Physical Exam  Constitutional: She is oriented to person, place, and time and well-developed, well-nourished, and in no distress.  HENT:  Head: Normocephalic and atraumatic.  Mouth/Throat: Oropharynx is clear and moist.  Eyes: Pupils are equal, round, and reactive to light.  Neck: Normal range of motion. Neck supple.  Cardiovascular: Normal rate, regular rhythm and  normal heart sounds.   Pulmonary/Chest: Effort normal and breath sounds normal.  Abdominal: Soft. Bowel sounds are normal.  Musculoskeletal: Normal range of motion.  No joint swelling.  Neurological: She is alert and oriented to person, place, and time.  Skin: Skin is warm and dry.    LABORATORY DATA:  CBC    Component Value Date/Time   WBC 17.0* 11/11/2014 1019   RBC 4.25 11/11/2014 1019   RBC 4.05 07/08/2013 1330   HGB 12.2 11/11/2014 1019   HCT 37.9 11/11/2014 1019   PLT 422* 11/11/2014 1019   MCV 89.2 11/11/2014 1019   MCH 28.7 11/11/2014  1019   MCHC 32.2 11/11/2014 1019   RDW 14.8 11/11/2014 1019   LYMPHSABS 2.3 11/11/2014 1019   MONOABS 0.5 11/11/2014 1019   EOSABS 0.0 11/11/2014 1019   BASOSABS 0.0 11/11/2014 1019   CMP     Component Value Date/Time   NA 139 07/08/2013 1330   K 3.9 07/08/2013 1330   CL 99 07/08/2013 1330   CO2 25 07/08/2013 1330   GLUCOSE 82 07/08/2013 1330   BUN 10 07/08/2013 1330   CREATININE 0.58 07/08/2013 1330   CALCIUM 9.0 07/08/2013 1330   PROT 7.6 07/08/2013 1330   ALBUMIN 4.0 07/08/2013 1330   AST 22 07/08/2013 1330   ALT 34 07/08/2013 1330   ALKPHOS 97 07/08/2013 1330   BILITOT 0.2* 07/08/2013 1330   GFRNONAA >90 07/08/2013 1330   GFRAA >90 07/08/2013 1330     PENDING LABS:  IgA Ferritin Vitamin d Celiac Panel    ASSESSMENT and THERAPY PLAN:  Iron deficiency anemia Intolerance to oral iron  34 year old with a history of iron deficiency. Dr. Stephenie Acres diagnosed her with celiac sprue based upon a positive IgG Gliadin Antibody test. TTG IgA was negative.  She has never had an IgA level. He has never had IgA endomysial antibody testing.    I will keep her apprised of the results of her ferritin level when it becomes available. If she needs additional iron supplementation we will arrange for IV replacement. She states she cannot tolerate oral iron.  I am not certain she has celiac sprue. Therefore we will do some of the testing as detailed above. In addition she may need referral back to Dr. Laural Golden for additional evalution.  Continue with ongoing 6 month observation with labs in regards to her iron deficiency.  All questions were answered. The patient knows to call the clinic with any problems, questions or concerns. We can certainly see the patient much sooner if necessary. This note was electronically signed.  This document serves as a record of services personally performed by Ancil Linsey, MD. It was created on her behalf by Arlyce Harman, a trained medical  scribe. The creation of this record is based on the scribe's personal observations and the provider's statements to them. This document has been checked and approved by the attending provider. This note was electronically signed. I have reviewed the above documentation for accuracy and completeness and I agree with the above.   Ancil Linsey, MD

## 2014-11-11 NOTE — Patient Instructions (Signed)
..  [place] at [facility] Discharge Instructions  RECOMMENDATIONS MADE BY THE CONSULTANT AND ANY TEST RESULTS WILL BE SENT TO YOUR REFERRING PHYSICIAN.  Extra labs ordered today. Lab and return in 6 months  Thank you for choosing [facility] at [facility] to provide your oncology and hematology care.  To afford each patient quality time with our provider, please arrive at least 15 minutes before your scheduled appointment time.    You need to re-schedule your appointment should you arrive 10 or more minutes late.  We strive to give you quality time with our providers, and arriving late affects you and other patients whose appointments are after yours.  Also, if you no show three or more times for appointments you may be dismissed from the clinic at the providers discretion.     Again, thank you for choosing [facility].  Our hope is that these requests will decrease the amount of time that you wait before being seen by our physicians.       _____________________________________________________________  Should you have questions after your visit to [facility], please contact our office at [phone] between the hours of 8:30 a.m. and 4:30 p.m.  Voicemails left after 4:30 p.m. will not be returned until the following business day.  For prescription refill requests, have your pharmacy contact our office.

## 2014-11-11 NOTE — Progress Notes (Signed)
Labs drawn

## 2014-11-12 LAB — FOLATE: FOLATE: 17.3 ng/mL (ref >5.9)

## 2014-11-12 LAB — IGA: IGA: 104 mg/dL (ref 87–352)

## 2014-11-12 LAB — VITAMIN D 25 HYDROXY (VIT D DEFICIENCY, FRACTURES): VIT D 25 HYDROXY: 13.2 ng/mL — AB (ref 30.0–100.0)

## 2014-11-16 ENCOUNTER — Encounter (HOSPITAL_BASED_OUTPATIENT_CLINIC_OR_DEPARTMENT_OTHER): Payer: Managed Care, Other (non HMO)

## 2014-11-16 ENCOUNTER — Encounter (HOSPITAL_COMMUNITY): Payer: Self-pay | Admitting: Emergency Medicine

## 2014-11-16 DIAGNOSIS — K900 Celiac disease: Secondary | ICD-10-CM

## 2014-11-16 DIAGNOSIS — D649 Anemia, unspecified: Secondary | ICD-10-CM

## 2014-11-16 DIAGNOSIS — E559 Vitamin D deficiency, unspecified: Secondary | ICD-10-CM

## 2014-11-16 LAB — TSH: TSH: 0.798 u[IU]/mL (ref 0.350–4.500)

## 2014-11-16 LAB — T4, FREE: FREE T4: 0.87 ng/dL (ref 0.61–1.12)

## 2014-11-16 MED ORDER — ERGOCALCIFEROL 1.25 MG (50000 UT) PO CAPS: ORAL_CAPSULE | ORAL | Status: DC

## 2014-11-16 NOTE — Progress Notes (Signed)
REDRAW FOR TSH,FREE T4

## 2014-11-19 ENCOUNTER — Telehealth (HOSPITAL_COMMUNITY): Payer: Self-pay | Admitting: *Deleted

## 2014-11-19 NOTE — Telephone Encounter (Signed)
Patient has prescription for Vitamin D and instructions

## 2014-11-22 ENCOUNTER — Encounter (HOSPITAL_COMMUNITY): Payer: Self-pay | Admitting: Hematology & Oncology

## 2014-11-22 DIAGNOSIS — IMO0002 Reserved for concepts with insufficient information to code with codable children: Secondary | ICD-10-CM | POA: Insufficient documentation

## 2015-03-06 ENCOUNTER — Other Ambulatory Visit (HOSPITAL_COMMUNITY): Payer: Self-pay | Admitting: Hematology & Oncology

## 2015-05-13 ENCOUNTER — Other Ambulatory Visit (HOSPITAL_COMMUNITY): Payer: Self-pay | Admitting: Hematology & Oncology

## 2015-05-13 ENCOUNTER — Encounter (HOSPITAL_COMMUNITY): Payer: Self-pay | Admitting: Hematology & Oncology

## 2015-05-13 ENCOUNTER — Encounter (HOSPITAL_COMMUNITY): Payer: Managed Care, Other (non HMO) | Attending: Hematology & Oncology | Admitting: Hematology & Oncology

## 2015-05-13 ENCOUNTER — Encounter (HOSPITAL_BASED_OUTPATIENT_CLINIC_OR_DEPARTMENT_OTHER): Payer: Managed Care, Other (non HMO)

## 2015-05-13 VITALS — BP 123/74 | HR 83 | Temp 98.6°F | Resp 16 | Wt 251.9 lb

## 2015-05-13 DIAGNOSIS — D500 Iron deficiency anemia secondary to blood loss (chronic): Secondary | ICD-10-CM

## 2015-05-13 DIAGNOSIS — K1370 Unspecified lesions of oral mucosa: Secondary | ICD-10-CM

## 2015-05-13 DIAGNOSIS — D509 Iron deficiency anemia, unspecified: Secondary | ICD-10-CM

## 2015-05-13 DIAGNOSIS — K900 Celiac disease: Secondary | ICD-10-CM

## 2015-05-13 DIAGNOSIS — R5383 Other fatigue: Secondary | ICD-10-CM

## 2015-05-13 DIAGNOSIS — E559 Vitamin D deficiency, unspecified: Secondary | ICD-10-CM | POA: Diagnosis not present

## 2015-05-13 DIAGNOSIS — M2550 Pain in unspecified joint: Secondary | ICD-10-CM

## 2015-05-13 DIAGNOSIS — Z888 Allergy status to other drugs, medicaments and biological substances status: Secondary | ICD-10-CM

## 2015-05-13 LAB — CBC WITH DIFFERENTIAL/PLATELET
BASOS ABS: 0 10*3/uL (ref 0.0–0.1)
BASOS PCT: 0 %
Eosinophils Absolute: 0.3 10*3/uL (ref 0.0–0.7)
Eosinophils Relative: 3 %
HEMATOCRIT: 36.2 % (ref 36.0–46.0)
HEMOGLOBIN: 11.6 g/dL — AB (ref 12.0–15.0)
Lymphocytes Relative: 16 %
Lymphs Abs: 1.9 10*3/uL (ref 0.7–4.0)
MCH: 28.3 pg (ref 26.0–34.0)
MCHC: 32 g/dL (ref 30.0–36.0)
MCV: 88.3 fL (ref 78.0–100.0)
Monocytes Absolute: 0.5 10*3/uL (ref 0.1–1.0)
Monocytes Relative: 4 %
NEUTROS PCT: 77 %
Neutro Abs: 9.3 10*3/uL — ABNORMAL HIGH (ref 1.7–7.7)
Platelets: 421 10*3/uL — ABNORMAL HIGH (ref 150–400)
RBC: 4.1 MIL/uL (ref 3.87–5.11)
RDW: 14.7 % (ref 11.5–15.5)
WBC: 12 10*3/uL — AB (ref 4.0–10.5)

## 2015-05-13 LAB — COMPREHENSIVE METABOLIC PANEL
ALBUMIN: 4.3 g/dL (ref 3.5–5.0)
ALK PHOS: 78 U/L (ref 38–126)
ALT: 51 U/L (ref 14–54)
AST: 37 U/L (ref 15–41)
Anion gap: 8 (ref 5–15)
BUN: 11 mg/dL (ref 6–20)
CO2: 26 mmol/L (ref 22–32)
Calcium: 9.1 mg/dL (ref 8.9–10.3)
Chloride: 106 mmol/L (ref 101–111)
Creatinine, Ser: 0.82 mg/dL (ref 0.44–1.00)
GFR calc Af Amer: >60 mL/min (ref >60)
GFR calc non Af Amer: >60 mL/min (ref >60)
GLUCOSE: 91 mg/dL (ref 65–99)
POTASSIUM: 3.9 mmol/L (ref 3.5–5.1)
Sodium: 140 mmol/L (ref 135–145)
TOTAL PROTEIN: 7.5 g/dL (ref 6.5–8.1)
Total Bilirubin: 0.4 mg/dL (ref 0.3–1.2)

## 2015-05-13 LAB — IRON AND TIBC
Iron: 33 ug/dL (ref 28–170)
SATURATION RATIOS: 10 % — AB (ref 10.4–31.8)
TIBC: 347 ug/dL (ref 250–450)
UIBC: 314 ug/dL

## 2015-05-13 LAB — FERRITIN: Ferritin: 138 ng/mL (ref 11–307)

## 2015-05-13 NOTE — Progress Notes (Signed)
LABS DRAWN

## 2015-05-13 NOTE — Patient Instructions (Signed)
[place] at [facility] Discharge Instructions  RECOMMENDATIONS MADE BY THE CONSULTANT AND ANY TEST RESULTS WILL BE SENT TO YOUR REFERRING PHYSICIAN.  Return in 6 months to see the doctor and for labs.   Please see your appointment list for date and time.    Thank you for choosing [place] at [facility] to provide your oncology and hematology care.  To afford each patient quality time with our provider, please arrive at least 15 minutes before your scheduled appointment time.    You need to re-schedule your appointment should you arrive 10 or more minutes late.  We strive to give you quality time with our providers, and arriving late affects you and other patients whose appointments are after yours.  Also, if you no show three or more times for appointments you may be dismissed from the clinic at the providers discretion.     Again, thank you for choosing [facility].  Our hope is that these requests will decrease the amount of time that you wait before being seen by our physicians.       _____________________________________________________________  Should you have questions after your visit to [facility], please contact our office at [phone] between the hours of 8:30 a.m. and 4:30 p.m.  Voicemails left after 4:30 p.m. will not be returned until the following business day.  For prescription refill requests, have your pharmacy contact our office.

## 2015-05-13 NOTE — Progress Notes (Signed)
Maureen Yates [address] [place] [address]  DIAGNOSIS:  Celiac Sprue.  Iron deficiency.  Gliadin IgG 92.5 units/mL 09/30/2013, tissue transglutaminase Ab IgA negative, Gliadin IgA negative  CURRENT THERAPY:IV iron prn  INTERVAL HISTORY: Maureen Yates 34 y.o. female returns for Celiac Sprue and iron deficiency . She states she was diagnosed with celiac sprue by Dr. Stephenie Acres.  She has seen Dr. Laural Golden in the past because of a family history of crohns disease.   [name] is here today with her 31 year old son.  She states that she's been doing pretty well, more or less. She does report that she's been bruising and has had some mouth ulcers. She states that by the time one heals up, she gets another one. She reports that they start out as small white sores, and then get bigger and red around the edges. She remarks that her energy is pretty low and her joint pain has been pretty prevalent over the past few months. She notes that all of these symptoms are c/w her iron deficiency symptoms. She also notes some joint pain.  She denies any blood in her stool, and reports irregular periods. She states they are usually heavy, but the past three months she's had heavy alternating with light.   MEDICAL HISTORY: Past Medical History  Diagnosis Date  . Environmental allergies   . GERD (gastroesophageal reflux disease)   . Anxiety   . IBS (irritable bowel syndrome)   . Tubal ectopic pregnancy     has Seasonal allergies; Anemia; IBS (irritable bowel syndrome); Adult celiac disease; Cystadenoma of right ovary; and Ectopic pregnancy, 03/26/2014 on her problem list.     is allergic to penicillins; casein; and augmentin.  [name] had no medications administered during this visit.  SURGICAL HISTORY: Past Surgical History  Procedure Laterality Date  . Rt ovary       removed 03/2010 for 4 pound tumor  . Cholecystectomy  Sept of 2012 gallstones  . Colonoscopy   02/27/2012    Procedure: COLONOSCOPY;  Surgeon: Rogene Houston, MD;  [facility];  Service: Endoscopy;  Laterality: N/A;  225  . Tubal ligation Bilateral 04/2013    SOCIAL HISTORY: Social History   Social History  . Marital Status: Married    Spouse Name: N/A  . Number of Children: N/A  . Years of Education: N/A   Occupational History  . Not on file.   Social History Main Topics  . Smoking status: Never Smoker   . Smokeless tobacco: Not on file  . Alcohol Use: No  . Drug Use: No  . Sexual Activity: Yes   Other Topics Concern  . Not on file   Social History Narrative    FAMILY HISTORY: Family History  Problem Relation Age of Onset  . Epilepsy Mother   . Epilepsy Brother   . Lupus Brother     Review of Systems  Gastrointestinal: Positive for diarrhea. Negative for constipation.       Diarrhea after eating Gluten.  14 point review of systems was performed and is negative except as detailed under history of present illness   PHYSICAL EXAMINATION  ECOG PERFORMANCE STATUS: 0 - Asymptomatic  Filed Vitals:   05/13/15 1300  BP: 123/74  Pulse: 83  Temp: 98.6 F (37 C)  Resp: 16   Physical Exam  Constitutional: She is oriented to person, place, and time and well-developed, well-nourished, and in no distress. Well groomed, appropriate for  age HENT:  Head: Normocephalic and atraumatic.  Mouth/Throat: Oropharynx is clear and moist.  Eyes: Pupils are equal, round, and reactive to light.  Neck: Normal range of motion. Neck supple.  Cardiovascular: Normal rate, regular rhythm and normal heart sounds.   Pulmonary/Chest: Effort normal and breath sounds normal.  Abdominal: Soft. Bowel sounds are normal. Obese. No HSM Musculoskeletal: Normal range of motion.  No joint swelling. or erythema Neurological: She is alert and oriented to person, place, and time.  Skin: Skin is warm and dry.    LABORATORY DATA: I have reviewed the data as listed  CBC      Component Value Date/Time   WBC 12.0* 05/13/2015 1300   RBC 4.10 05/13/2015 1300   RBC 4.05 07/08/2013 1330   HGB 11.6* 05/13/2015 1300   HCT 36.2 05/13/2015 1300   PLT 421* 05/13/2015 1300   MCV 88.3 05/13/2015 1300   MCH 28.3 05/13/2015 1300   MCHC 32.0 05/13/2015 1300   RDW 14.7 05/13/2015 1300   LYMPHSABS 1.9 05/13/2015 1300   MONOABS 0.5 05/13/2015 1300   EOSABS 0.3 05/13/2015 1300   BASOSABS 0.0 05/13/2015 1300   CMP     Component Value Date/Time   NA 140 05/13/2015 1300   K 3.9 05/13/2015 1300   CL 106 05/13/2015 1300   CO2 26 05/13/2015 1300   GLUCOSE 91 05/13/2015 1300   BUN 11 05/13/2015 1300   CREATININE 0.82 05/13/2015 1300   CALCIUM 9.1 05/13/2015 1300   PROT 7.5 05/13/2015 1300   ALBUMIN 4.3 05/13/2015 1300   AST 37 05/13/2015 1300   ALT 51 05/13/2015 1300   ALKPHOS 78 05/13/2015 1300   BILITOT 0.4 05/13/2015 1300   GFRNONAA >60 05/13/2015 1300   GFRAA >60 05/13/2015 1300       ASSESSMENT and THERAPY PLAN:  Iron deficiency anemia Intolerance to oral iron Vitamin D deficiency  34 year old with a history of iron deficiency. Dr. Stephenie Acres diagnosed her with celiac sprue based upon a positive IgG Gliadin Antibody test. TTG IgA was negative.  Ferritin today is WNL, however patient notes her symptoms are c/w prior iron deficiency. She notes improvement in these symptoms in the past when she received one dose of IV iron. I will arrange for one dose of ferrlicet.   Continue with ongoing 6 month observation with labs in regards to her iron deficiency.   If her mouth sores get worse, I recommend seeing her dentist for additional evaluation.  All questions were answered. The patient knows to call the clinic with any problems, questions or concerns. We can certainly see the patient much sooner if necessary. This note was electronically signed.  This document serves as a record of services personally performed by Ancil Linsey, MD. It was created on her  behalf by Toni Amend, a trained medical scribe. The creation of this record is based on the scribe's personal observations and the provider's statements to them. This document has been checked and approved by the attending provider.  This note was electronically signed. I have reviewed the above documentation for accuracy and completeness and I agree with the above.   Ancil Linsey, MD

## 2015-05-19 ENCOUNTER — Telehealth (HOSPITAL_COMMUNITY): Payer: Self-pay | Admitting: Hematology & Oncology

## 2015-05-19 NOTE — Telephone Encounter (Signed)
PC TO [employer] SPOKE WITH ROSE. ?'D IF [id number] REQUIRES AUTH. PER [facility]SE IT DOES NOT REQUIRE AUTH  CALL REF# [id number]

## 2015-05-24 ENCOUNTER — Encounter (HOSPITAL_COMMUNITY): Payer: Self-pay

## 2015-05-24 ENCOUNTER — Encounter (HOSPITAL_BASED_OUTPATIENT_CLINIC_OR_DEPARTMENT_OTHER): Payer: Managed Care, Other (non HMO)

## 2015-05-24 DIAGNOSIS — D509 Iron deficiency anemia, unspecified: Secondary | ICD-10-CM

## 2015-05-24 MED ORDER — SODIUM CHLORIDE 0.9 % IV SOLN
INTRAVENOUS | Status: DC
  Administered 2015-05-24: 13:00:00 via INTRAVENOUS

## 2015-05-24 MED ORDER — SODIUM CHLORIDE 0.9 % IV SOLN
125.0000 mg | Freq: Once | INTRAVENOUS | Status: AC
  Administered 2015-05-24: 125 mg via INTRAVENOUS
  Filled 2015-05-24: qty 10

## 2015-05-24 NOTE — Patient Instructions (Signed)
[place] at [facility] Discharge Instructions  RECOMMENDATIONS MADE BY THE CONSULTANT AND ANY TEST RESULTS WILL BE SENT TO YOUR REFERRING PHYSICIAN.  Iron infusion today Follow up as scheduled Call the clinic if you have any questions or concerns   Thank you for choosing [place] at [facility] to provide your oncology and hematology care.  To afford each patient quality time with our provider, please arrive at least 15 minutes before your scheduled appointment time.    You need to re-schedule your appointment should you arrive 10 or more minutes late.  We strive to give you quality time with our providers, and arriving late affects you and other patients whose appointments are after yours.  Also, if you no show three or more times for appointments you may be dismissed from the clinic at the providers discretion.     Again, thank you for choosing [facility].  Our hope is that these requests will decrease the amount of time that you wait before being seen by our physicians.       _____________________________________________________________  Should you have questions after your visit to [facility], please contact our office at [phone] between the hours of 8:30 a.m. and 4:30 p.m.  Voicemails left after 4:30 p.m. will not be returned until the following business day.  For prescription refill requests, have your pharmacy contact our office.

## 2015-05-24 NOTE — Progress Notes (Signed)
Maureen Yates Tolerated iron infusion well Discharged ambulatory

## 2015-08-30 ENCOUNTER — Other Ambulatory Visit (HOSPITAL_COMMUNITY): Payer: Self-pay | Admitting: Oncology

## 2015-08-30 DIAGNOSIS — D500 Iron deficiency anemia secondary to blood loss (chronic): Secondary | ICD-10-CM

## 2015-09-01 ENCOUNTER — Encounter (HOSPITAL_COMMUNITY): Payer: Managed Care, Other (non HMO) | Attending: Hematology & Oncology

## 2015-09-01 DIAGNOSIS — D500 Iron deficiency anemia secondary to blood loss (chronic): Secondary | ICD-10-CM | POA: Insufficient documentation

## 2015-09-01 DIAGNOSIS — K900 Celiac disease: Secondary | ICD-10-CM | POA: Diagnosis not present

## 2015-09-01 DIAGNOSIS — E559 Vitamin D deficiency, unspecified: Secondary | ICD-10-CM | POA: Insufficient documentation

## 2015-09-01 DIAGNOSIS — K8681 Exocrine pancreatic insufficiency: Secondary | ICD-10-CM | POA: Diagnosis not present

## 2015-09-01 LAB — COMPREHENSIVE METABOLIC PANEL
ALBUMIN: 4 g/dL (ref 3.5–5.0)
ALK PHOS: 78 U/L (ref 38–126)
ALT: 26 U/L (ref 14–54)
ANION GAP: 6 (ref 5–15)
AST: 20 U/L (ref 15–41)
BILIRUBIN TOTAL: 0.1 mg/dL — AB (ref 0.3–1.2)
BUN: 13 mg/dL (ref 6–20)
CALCIUM: 9.1 mg/dL (ref 8.9–10.3)
CO2: 28 mmol/L (ref 22–32)
Chloride: 102 mmol/L (ref 101–111)
Creatinine, Ser: 0.78 mg/dL (ref 0.44–1.00)
GFR calc non Af Amer: >60 mL/min (ref >60)
GLUCOSE: 90 mg/dL (ref 65–99)
POTASSIUM: 4.1 mmol/L (ref 3.5–5.1)
SODIUM: 136 mmol/L (ref 135–145)
TOTAL PROTEIN: 7.4 g/dL (ref 6.5–8.1)

## 2015-09-01 LAB — CBC WITH DIFFERENTIAL/PLATELET
BASOS PCT: 0 %
Basophils Absolute: 0 10*3/uL (ref 0.0–0.1)
EOS ABS: 0.4 10*3/uL (ref 0.0–0.7)
Eosinophils Relative: 3 %
HCT: 38.3 % (ref 36.0–46.0)
Hemoglobin: 12.4 g/dL (ref 12.0–15.0)
LYMPHS ABS: 2.2 10*3/uL (ref 0.7–4.0)
Lymphocytes Relative: 18 %
MCH: 28.4 pg (ref 26.0–34.0)
MCHC: 32.4 g/dL (ref 30.0–36.0)
MCV: 87.8 fL (ref 78.0–100.0)
MONO ABS: 0.4 10*3/uL (ref 0.1–1.0)
MONOS PCT: 4 %
Neutro Abs: 9.1 10*3/uL — ABNORMAL HIGH (ref 1.7–7.7)
Neutrophils Relative %: 75 %
Platelets: 391 10*3/uL (ref 150–400)
RBC: 4.36 MIL/uL (ref 3.87–5.11)
RDW: 14.5 % (ref 11.5–15.5)
WBC: 12.2 10*3/uL — ABNORMAL HIGH (ref 4.0–10.5)

## 2015-09-01 LAB — FERRITIN: Ferritin: 130 ng/mL (ref 11–307)

## 2015-09-02 LAB — VITAMIN D 25 HYDROXY (VIT D DEFICIENCY, FRACTURES): Vit D, 25-Hydroxy: 15.6 ng/mL — ABNORMAL LOW (ref 30.0–100.0)

## 2015-09-07 ENCOUNTER — Encounter (HOSPITAL_COMMUNITY): Payer: Self-pay | Admitting: Hematology & Oncology

## 2015-09-08 ENCOUNTER — Other Ambulatory Visit (HOSPITAL_COMMUNITY): Payer: Self-pay | Admitting: Oncology

## 2015-09-08 DIAGNOSIS — E559 Vitamin D deficiency, unspecified: Secondary | ICD-10-CM

## 2015-09-08 MED ORDER — VITAMIN D (ERGOCALCIFEROL) 1.25 MG (50000 UNIT) PO CAPS: ORAL_CAPSULE | ORAL | Status: DC

## 2015-11-10 ENCOUNTER — Encounter (HOSPITAL_COMMUNITY): Payer: Self-pay | Admitting: Hematology & Oncology

## 2015-11-10 ENCOUNTER — Encounter (HOSPITAL_COMMUNITY): Payer: Managed Care, Other (non HMO) | Attending: Hematology & Oncology | Admitting: Hematology & Oncology

## 2015-11-10 ENCOUNTER — Encounter (HOSPITAL_COMMUNITY): Payer: Managed Care, Other (non HMO)

## 2015-11-10 VITALS — BP 123/75 | HR 98 | Temp 97.9°F | Resp 16 | Wt 251.7 lb

## 2015-11-10 DIAGNOSIS — K219 Gastro-esophageal reflux disease without esophagitis: Secondary | ICD-10-CM | POA: Insufficient documentation

## 2015-11-10 DIAGNOSIS — D473 Essential (hemorrhagic) thrombocythemia: Secondary | ICD-10-CM | POA: Diagnosis not present

## 2015-11-10 DIAGNOSIS — N920 Excessive and frequent menstruation with regular cycle: Secondary | ICD-10-CM | POA: Insufficient documentation

## 2015-11-10 DIAGNOSIS — K900 Celiac disease: Secondary | ICD-10-CM | POA: Insufficient documentation

## 2015-11-10 DIAGNOSIS — D509 Iron deficiency anemia, unspecified: Secondary | ICD-10-CM | POA: Diagnosis not present

## 2015-11-10 DIAGNOSIS — D500 Iron deficiency anemia secondary to blood loss (chronic): Secondary | ICD-10-CM

## 2015-11-10 DIAGNOSIS — N951 Menopausal and female climacteric states: Secondary | ICD-10-CM

## 2015-11-10 DIAGNOSIS — Z880 Allergy status to penicillin: Secondary | ICD-10-CM | POA: Insufficient documentation

## 2015-11-10 DIAGNOSIS — Z9049 Acquired absence of other specified parts of digestive tract: Secondary | ICD-10-CM | POA: Diagnosis not present

## 2015-11-10 DIAGNOSIS — O001 Tubal pregnancy without intrauterine pregnancy: Secondary | ICD-10-CM | POA: Insufficient documentation

## 2015-11-10 DIAGNOSIS — O009 Unspecified ectopic pregnancy without intrauterine pregnancy: Secondary | ICD-10-CM | POA: Insufficient documentation

## 2015-11-10 DIAGNOSIS — F419 Anxiety disorder, unspecified: Secondary | ICD-10-CM | POA: Diagnosis not present

## 2015-11-10 DIAGNOSIS — K589 Irritable bowel syndrome without diarrhea: Secondary | ICD-10-CM | POA: Diagnosis not present

## 2015-11-10 DIAGNOSIS — D75839 Thrombocytosis, unspecified: Secondary | ICD-10-CM

## 2015-11-10 DIAGNOSIS — E559 Vitamin D deficiency, unspecified: Secondary | ICD-10-CM | POA: Insufficient documentation

## 2015-11-10 DIAGNOSIS — G2581 Restless legs syndrome: Secondary | ICD-10-CM

## 2015-11-10 DIAGNOSIS — D72829 Elevated white blood cell count, unspecified: Secondary | ICD-10-CM | POA: Diagnosis not present

## 2015-11-10 DIAGNOSIS — Z9889 Other specified postprocedural states: Secondary | ICD-10-CM | POA: Diagnosis not present

## 2015-11-10 DIAGNOSIS — K909 Intestinal malabsorption, unspecified: Secondary | ICD-10-CM

## 2015-11-10 LAB — CBC
HEMATOCRIT: 39.4 % (ref 36.0–46.0)
HEMOGLOBIN: 12.7 g/dL (ref 12.0–15.0)
MCH: 28.2 pg (ref 26.0–34.0)
MCHC: 32.2 g/dL (ref 30.0–36.0)
MCV: 87.4 fL (ref 78.0–100.0)
Platelets: 427 10*3/uL — ABNORMAL HIGH (ref 150–400)
RBC: 4.51 MIL/uL (ref 3.87–5.11)
RDW: 14.9 % (ref 11.5–15.5)
WBC: 12 10*3/uL — AB (ref 4.0–10.5)

## 2015-11-10 LAB — FERRITIN: Ferritin: 124 ng/mL (ref 11–307)

## 2015-11-10 NOTE — Patient Instructions (Signed)
[place] at [facility] Discharge Instructions  RECOMMENDATIONS MADE BY THE CONSULTANT AND ANY TEST RESULTS WILL BE SENT TO YOUR REFERRING PHYSICIAN.   Exam and discussion by Dr Whitney Muse today Labs today Labs in 6 months Return to see the doctor in 6 months Please call the clinic if you have any questions or concerns     Thank you for choosing [place] at [facility] to provide your oncology and hematology care.  To afford each patient quality time with our provider, please arrive at least 15 minutes before your scheduled appointment time.   Beginning January 23rd 2017 lab work for the [facility] will be done in the  Main lab at [facility] on 1st floor. If you have a lab appointment with the [facility] please come in thru the  Main Entrance and check in at the main information desk  You need to re-schedule your appointment should you arrive 10 or more minutes late.  We strive to give you quality time with our providers, and arriving late affects you and other patients whose appointments are after yours.  Also, if you no show three or more times for appointments you may be dismissed from the clinic at the providers discretion.     Again, thank you for choosing [facility].  Our hope is that these requests will decrease the amount of time that you wait before being seen by our physicians.       _____________________________________________________________  Should you have questions after your visit to [facility], please contact our office at [phone] between the hours of 8:30 a.m. and 4:30 p.m.  Voicemails left after 4:30 p.m. will not be returned until the following business day.  For prescription refill requests, have your pharmacy contact our office.         Resources For Cancer Patients and their Caregivers ? American Cancer Society: Can assist with transportation, wigs, general needs, runs  Look Good Feel Better.        919-569-5411 ? Cancer Care: Provides financial assistance, online support groups, medication/co-pay assistance.  1-800-813-HOPE 838-615-2560) ? [facility] Assists [place] cancer patients and their families through emotional , educational and financial support.  (605)859-7959 ? [place] DSS Where to apply for food stamps, Medicaid and utility assistance. 3397385246 ? [facility]: Transportation to medical appointments. 276-193-4287 ? Social Security Administration: May apply for disability if have a Stage IV cancer. 213-067-9028 989-577-2458 ? [employer], Disability and Transit Services: Assists with nutrition, care and transit needs. [phone]

## 2015-11-10 NOTE — Progress Notes (Signed)
Maureen Labrum, MD [address] [place] [address]  DIAGNOSIS:  Celiac Sprue.  Iron deficiency.  Gliadin IgG 92.5 units/mL 09/30/2013, tissue transglutaminase Ab IgA negative, Gliadin IgA negative  CURRENT THERAPY:IV iron prn  INTERVAL HISTORY: Maureen Yates 35 y.o. female returns for Celiac Sprue and iron deficiency . She states she was diagnosed with celiac sprue by Dr. Stephenie Acres.  She has seen Dr. Laural Golden in the past because of a family history of crohns disease.   Maureen Yates was here alone today.  She has not been feeling well.  She has not been sleeping and she has been having "unreal" night sweats.   Her husband told her that she moves her feet and her 1st two toes all night while she sleeps. This disturbs his sleep. Nothing she has taken has helped this. She said that she messed up her Achilles tendon due to this. Her husband says that she does not snore unless she is completely exhausted. This doesn't happen all the time. She has never had a sleep study done.   She also said she is exhausted a lot. She needs a nap in the afternoon but usually cannot get one.   Her periods are irregular. She only has one ovary. She says that she usually has one light period and one heavy period and this alternates. She said that her periods don't have a set day that they start. They are just periodic.   In August she was having a problem with tunnel vision it would be on one side or another never on both. 30 minutes after the tunnel vision she would get a severe headache. She went to the eye doctor to get this checked and she was told that she was fine. It went away after a couple of weeks. This came back in April. She has never had migraines and they do not run in her family.   Her appetite is pretty good. She has been eating ice and she notes that she does not normally do this.   She denies nausea, vomiting, diarrhea or constipation.   MEDICAL HISTORY: Past Medical History    Diagnosis Date  . Environmental allergies   . GERD (gastroesophageal reflux disease)   . Anxiety   . IBS (irritable bowel syndrome)   . Tubal ectopic pregnancy     has Seasonal allergies; Anemia; IBS (irritable bowel syndrome); Adult celiac disease; Cystadenoma of right ovary; Ectopic pregnancy, 03/26/2014; and Vitamin D deficiency on her problem list.     is allergic to penicillins; casein; and augmentin.  Maureen Yates had no medications administered during this visit.  SURGICAL HISTORY: Past Surgical History  Procedure Laterality Date  . Rt ovary       removed 03/2010 for 4 pound tumor  . Cholecystectomy  Sept of 2012 gallstones  . Colonoscopy  02/27/2012    Procedure: COLONOSCOPY;  Surgeon: Rogene Houston, MD;  [facility];  Service: Endoscopy;  Laterality: N/A;  225  . Tubal ligation Bilateral 04/2013    SOCIAL HISTORY: Social History   Social History  . Marital Status: Married    Spouse Name: N/A  . Number of Children: N/A  . Years of Education: N/A   Occupational History  . Not on file.   Social History Main Topics  . Smoking status: Never Smoker   . Smokeless tobacco: Not on file  . Alcohol Use: No  . Drug Use: No  . Sexual Activity: Yes   Other  Topics Concern  . Not on file   Social History Narrative    FAMILY HISTORY: Family History  Problem Relation Age of Onset  . Epilepsy Mother   . Epilepsy Brother   . Lupus Brother   Review of Systems   Positive for fatigue. Positive for tunnel vision and headaches. Gets a severe headache 30 min after tunnel vision. Started in August went away came back in April.  Gastrointestinal: Positive for diarrhea. Negative for constipation.       Diarrhea after eating Gluten.  Positive for insomnia and night sweats.  Husband says she moves her feet and toes during the night. Only snores when she is exhausted.   14 point review of systems was performed and is negative except as detailed under history  of present illness   PHYSICAL EXAMINATION  ECOG PERFORMANCE STATUS: 0 - Asymptomatic  Filed Vitals:   11/10/15 1200  BP: 123/75  Pulse: 98  Temp: 97.9 F (36.6 C)  Resp: 16   Physical Exam  Constitutional: She is oriented to person, place, and time and well-developed, well-nourished, and in no distress. Well groomed, appropriate for age HENT:  Head: Normocephalic and atraumatic.  Mouth/Throat: Oropharynx is clear and moist.  Eyes: Pupils are equal, round, and reactive to light. No scleral icterus Neck: Normal range of motion. Neck supple.  Cardiovascular: Normal rate, regular rhythm and normal heart sounds.   Pulmonary/Chest: Effort normal and breath sounds normal.  Abdominal: Soft. Bowel sounds are normal. Obese. No HSM Musculoskeletal: Normal range of motion.  No joint swelling. or erythema Neurological: She is alert and oriented to person, place, and time.  Skin: Skin is warm and dry.    LABORATORY DATA: I have reviewed the data as listed  CBC    Component Value Date/Time   WBC 12.0* 11/10/2015 1225   RBC 4.51 11/10/2015 1225   RBC 4.05 07/08/2013 1330   HGB 12.7 11/10/2015 1225   HCT 39.4 11/10/2015 1225   PLT 427* 11/10/2015 1225   MCV 87.4 11/10/2015 1225   MCH 28.2 11/10/2015 1225   MCHC 32.2 11/10/2015 1225   RDW 14.9 11/10/2015 1225   LYMPHSABS 2.2 09/01/2015 1255   MONOABS 0.4 09/01/2015 1255   EOSABS 0.4 09/01/2015 1255   BASOSABS 0.0 09/01/2015 1255   CMP     Component Value Date/Time   NA 136 09/01/2015 1255   K 4.1 09/01/2015 1255   CL 102 09/01/2015 1255   CO2 28 09/01/2015 1255   GLUCOSE 90 09/01/2015 1255   BUN 13 09/01/2015 1255   CREATININE 0.78 09/01/2015 1255   CALCIUM 9.1 09/01/2015 1255   PROT 7.4 09/01/2015 1255   ALBUMIN 4.0 09/01/2015 1255   AST 20 09/01/2015 1255   ALT 26 09/01/2015 1255   ALKPHOS 78 09/01/2015 1255   BILITOT 0.1* 09/01/2015 1255   GFRNONAA >60 09/01/2015 1255   GFRAA >60 09/01/2015 1255       ASSESSMENT and THERAPY PLAN:  Iron deficiency anemia Intolerance to oral iron Vitamin D deficiency Persistent leukocytosis Thrombocytosis  35 year old with a history of iron deficiency. Dr. Stephenie Acres diagnosed her with celiac sprue based upon a positive IgG Gliadin Antibody test. TTG IgA was negative.  Given her complaints of night sweats and on lab review persistent leukocytosis/thrombocytosis, I will order labs for MPD. She will be notified of the results.  Orders Placed This Encounter  Procedures  . JAK2 V617F, Rfx CALR/E12/MPL    Standing Status: Future     Number of Occurrences:  1     Standing Expiration Date: 11/09/2016  . BCR-ABL1, CML/ALL, PCR, QUANT  . Other/Misc lab test    Order Specific Question:  Test name / description:    Answer:  flow cytometry  . CBC with Differential    Standing Status: Future     Number of Occurrences:      Standing Expiration Date: 11/09/2016  . Ferritin    Standing Status: Future     Number of Occurrences:      Standing Expiration Date: 11/09/2016  . CALR + JAK2 EXON12 + MPL    She is complaining of restless legs and pagophagia. Ferritin is pending. She will be notified of results when available.   She will return in 6 months for a follow up.   All questions were answered. The patient knows to call the clinic with any problems, questions or concerns. We can certainly see the patient much sooner if necessary.  This note was electronically signed.  This document serves as a record of services personally performed by Ancil Linsey, MD. It was created on her behalf by Kandace Blitz, a trained medical scribe. The creation of this record is based on the scribe's personal observations and the provider's statements to them. This document has been checked and approved by the attending provider.  I have reviewed the above documentation for accuracy and completeness and I agree with the above.   Ancil Linsey, MD

## 2015-11-11 ENCOUNTER — Other Ambulatory Visit (HOSPITAL_COMMUNITY): Payer: Self-pay | Admitting: Oncology

## 2015-11-11 DIAGNOSIS — E559 Vitamin D deficiency, unspecified: Secondary | ICD-10-CM

## 2015-11-11 LAB — VITAMIN D 25 HYDROXY (VIT D DEFICIENCY, FRACTURES): VIT D 25 HYDROXY: 19.7 ng/mL — AB (ref 30.0–100.0)

## 2015-11-11 MED ORDER — VITAMIN D (ERGOCALCIFEROL) 1.25 MG (50000 UNIT) PO CAPS: ORAL_CAPSULE | ORAL | Status: DC

## 2015-11-15 ENCOUNTER — Encounter (HOSPITAL_COMMUNITY): Payer: Self-pay | Admitting: Hematology & Oncology

## 2015-11-17 LAB — BCR-ABL1, CML/ALL, PCR, QUANT

## 2015-11-18 ENCOUNTER — Encounter (HOSPITAL_COMMUNITY): Payer: Self-pay | Admitting: Hematology & Oncology

## 2015-11-21 LAB — CALR + JAK2 E12-15 + MPL (REFLEXED)

## 2015-11-21 LAB — JAK2 V617F, W REFLEX TO CALR/E12/MPL

## 2015-11-27 ENCOUNTER — Encounter (HOSPITAL_COMMUNITY): Payer: Self-pay | Admitting: Hematology & Oncology

## 2015-12-21 DIAGNOSIS — Z5189 Encounter for other specified aftercare: Secondary | ICD-10-CM | POA: Insufficient documentation

## 2015-12-21 DIAGNOSIS — G43109 Migraine with aura, not intractable, without status migrainosus: Secondary | ICD-10-CM | POA: Insufficient documentation

## 2016-02-23 DIAGNOSIS — K219 Gastro-esophageal reflux disease without esophagitis: Secondary | ICD-10-CM | POA: Insufficient documentation

## 2016-03-02 ENCOUNTER — Encounter (INDEPENDENT_AMBULATORY_CARE_PROVIDER_SITE_OTHER): Payer: Self-pay | Admitting: Internal Medicine

## 2016-03-20 ENCOUNTER — Encounter (INDEPENDENT_AMBULATORY_CARE_PROVIDER_SITE_OTHER): Payer: Self-pay

## 2016-03-20 ENCOUNTER — Ambulatory Visit (INDEPENDENT_AMBULATORY_CARE_PROVIDER_SITE_OTHER): Payer: Managed Care, Other (non HMO) | Admitting: Internal Medicine

## 2016-03-20 ENCOUNTER — Encounter (INDEPENDENT_AMBULATORY_CARE_PROVIDER_SITE_OTHER): Payer: Self-pay | Admitting: *Deleted

## 2016-03-20 ENCOUNTER — Encounter (INDEPENDENT_AMBULATORY_CARE_PROVIDER_SITE_OTHER): Payer: Self-pay | Admitting: Internal Medicine

## 2016-03-20 VITALS — BP 146/90 | HR 84 | Temp 98.0°F | Ht 67.5 in | Wt 258.0 lb

## 2016-03-20 DIAGNOSIS — R1011 Right upper quadrant pain: Principal | ICD-10-CM

## 2016-03-20 DIAGNOSIS — R1010 Upper abdominal pain, unspecified: Secondary | ICD-10-CM | POA: Diagnosis not present

## 2016-03-20 DIAGNOSIS — G8929 Other chronic pain: Secondary | ICD-10-CM

## 2016-03-20 MED ORDER — DICYCLOMINE HCL 10 MG PO CAPS: 10.0000 mg | ORAL_CAPSULE | Freq: Three times a day (TID) | ORAL | 3 refills | Status: DC

## 2016-03-20 NOTE — Patient Instructions (Signed)
[place] RUQ . OV in 3 months

## 2016-03-20 NOTE — Progress Notes (Signed)
Subjective:    Patient ID: Maureen Yates, female    DOB: 04-03-1981, 35 y.o.   MRN: [id number]  HPI Rferred by Dr. Pleas Koch for abdominal pain. She says she has nausea when the pain is severe.  She states she has been having rt upper quadrant pain and pain at her umbilicus.When she has the pain at her umblicus, it radiates into her back.  She is having the pain as a constant, dull pain. She says sometimes the pain will become severe. Her symptoms started 02/11/2016. She says she is having 6-7 stools a day. Her stools are loose.  She states her stools are always loose.  Stools have been loose since age 38. She has seen some bright red blood, but she thinks it is from going to have a BM so often. Hx of cholecystectomy at [facility] in 2013 (sludge and stones, inflammation).  She is seen at the [place] [facility] for anemia.  Her last iron infusion was one year ago.   Her appetite is okay. She has gained 24 pounds since her visit in 2013. No recent antibiotics.   02/24/2016 albumin 4.5, total bili 0.2, ALP 91, AST 12, ALT 18 H and H 12.0 AND 38.5, PLATELET CT 415. 02/24/2016 h. PYLORI NEGATIVE.    02/27/2012 Colonoscopy: Chronic diarrhea. Impression:  Normal terminal ileum and normal colonoscopy except external hemorrhoids.  Random biopsies taken from mucosa of sigmoid colon looking for microscopic colitis.  Suspect chronic diarrhea secondary to IBS.  She may have bled from hemorrhoids or self-limiting colitis.  Biopsy: Benign colonic mucosa. No microscopic colitis active inflammation or granulomas.   Review of Systems Past Medical History:  Diagnosis Date  . Anxiety   . Environmental allergies   . GERD (gastroesophageal reflux disease)   . IBS (irritable bowel syndrome)   . Tubal ectopic pregnancy     Past Surgical History:  Procedure Laterality Date  . CHOLECYSTECTOMY  Sept of 2012 gallstones  . COLONOSCOPY  02/27/2012   Procedure: COLONOSCOPY;  Surgeon: Rogene Houston, MD;   Location: [facility];  Service: Endoscopy;  Laterality: N/A;  225  . rt ovary      removed 03/2010 for 4 pound tumor  . TUBAL LIGATION Bilateral 04/2013    Allergies  Allergen Reactions  . Penicillins Rash  . Casein Other (See Comments)    G.I. Upset  . Augmentin [Amoxicillin-Pot Clavulanate] Swelling and Rash    Current Outpatient Prescriptions on File Prior to Visit  Medication Sig Dispense Refill  . acetaminophen (TYLENOL) 500 MG tablet Take 500-1,000 mg by mouth every 6 (six) hours as needed. Pain    . Cholecalciferol (VITAMIN D3) 1000 UNITS CAPS Take 1,000 Units by mouth daily.    . fexofenadine (ALLEGRA) 180 MG tablet Take 180 mg by mouth daily.    Marland Kitchen FLUoxetine (PROZAC) 20 MG capsule Take 60 mg by mouth daily.    Marland Kitchen lamoTRIgine (LAMICTAL) 100 MG tablet Take 100 mg by mouth daily.    . hyoscyamine (LEVBID) 0.375 MG 12 hr tablet Take 1 tablet (0.375 mg total) by mouth daily. (Patient not taking: Reported on 11/11/2014) 30 tablet 5   No current facility-administered medications on file prior to visit.        Objective:   Physical Exam Blood pressure (!) 146/90, pulse 84, temperature 98 F (36.7 C), height 5' 7.5" (1.715 m), weight 258 lb (117 kg). Alert and oriented. Skin warm and dry. Oral mucosa is moist.   . Sclera  anicteric, conjunctivae is pink. Thyroid not enlarged. No cervical lymphadenopathy. Lungs clear. Heart regular rate and rhythm.  Abdomen is soft. Bowel sounds are positive. No hepatomegaly. No abdominal masses felt. Slight tenderness rt upper quadrant and lower abdomen.  No edema to lower extremities.         Assessment & Plan:  Abdominal pain ? IBS. Am going to start her on Dicyclomine TID. [place] RUQ OV in 3 months.

## 2016-03-22 ENCOUNTER — Ambulatory Visit (HOSPITAL_COMMUNITY): Admission: RE | Admit: 2016-03-22 | Payer: Managed Care, Other (non HMO) | Source: Ambulatory Visit

## 2016-05-10 ENCOUNTER — Ambulatory Visit (HOSPITAL_COMMUNITY): Payer: Managed Care, Other (non HMO) | Admitting: Hematology & Oncology

## 2016-05-10 ENCOUNTER — Other Ambulatory Visit (HOSPITAL_COMMUNITY): Payer: Managed Care, Other (non HMO)

## 2016-06-18 ENCOUNTER — Ambulatory Visit (HOSPITAL_COMMUNITY): Payer: Managed Care, Other (non HMO) | Admitting: Hematology & Oncology

## 2016-06-18 ENCOUNTER — Encounter (HOSPITAL_COMMUNITY): Payer: Managed Care, Other (non HMO) | Attending: Hematology & Oncology

## 2016-06-18 ENCOUNTER — Other Ambulatory Visit (HOSPITAL_COMMUNITY): Payer: Self-pay | Admitting: Oncology

## 2016-06-18 ENCOUNTER — Other Ambulatory Visit (HOSPITAL_COMMUNITY): Payer: Managed Care, Other (non HMO)

## 2016-06-18 DIAGNOSIS — D72829 Elevated white blood cell count, unspecified: Secondary | ICD-10-CM | POA: Insufficient documentation

## 2016-06-18 DIAGNOSIS — D509 Iron deficiency anemia, unspecified: Secondary | ICD-10-CM | POA: Diagnosis not present

## 2016-06-18 LAB — CBC WITH DIFFERENTIAL/PLATELET
Basophils Absolute: 0 10*3/uL (ref 0.0–0.1)
Basophils Relative: 0 %
EOS PCT: 3 %
Eosinophils Absolute: 0.4 10*3/uL (ref 0.0–0.7)
HEMATOCRIT: 37.6 % (ref 36.0–46.0)
Hemoglobin: 12 g/dL (ref 12.0–15.0)
LYMPHS ABS: 2.4 10*3/uL (ref 0.7–4.0)
LYMPHS PCT: 21 %
MCH: 28 pg (ref 26.0–34.0)
MCHC: 31.9 g/dL (ref 30.0–36.0)
MCV: 87.9 fL (ref 78.0–100.0)
MONO ABS: 0.6 10*3/uL (ref 0.1–1.0)
Monocytes Relative: 5 %
Neutro Abs: 8 10*3/uL — ABNORMAL HIGH (ref 1.7–7.7)
Neutrophils Relative %: 71 %
PLATELETS: 446 10*3/uL — AB (ref 150–400)
RBC: 4.28 MIL/uL (ref 3.87–5.11)
RDW: 15.2 % (ref 11.5–15.5)
WBC: 11.4 10*3/uL — ABNORMAL HIGH (ref 4.0–10.5)

## 2016-06-18 LAB — FERRITIN: Ferritin: 90 ng/mL (ref 11–307)

## 2016-06-28 ENCOUNTER — Encounter (HOSPITAL_COMMUNITY): Payer: Self-pay

## 2016-06-28 ENCOUNTER — Encounter (HOSPITAL_BASED_OUTPATIENT_CLINIC_OR_DEPARTMENT_OTHER): Payer: Managed Care, Other (non HMO)

## 2016-06-28 VITALS — BP 116/69 | HR 93 | Temp 98.0°F | Resp 20

## 2016-06-28 DIAGNOSIS — D500 Iron deficiency anemia secondary to blood loss (chronic): Secondary | ICD-10-CM

## 2016-06-28 MED ORDER — SODIUM CHLORIDE 0.9 % IV SOLN
Freq: Once | INTRAVENOUS | Status: AC
  Administered 2016-06-28: 14:00:00 via INTRAVENOUS

## 2016-06-28 MED ORDER — FERUMOXYTOL INJECTION 510 MG/17 ML
510.0000 mg | Freq: Once | INTRAVENOUS | Status: AC
  Administered 2016-06-28: 510 mg via INTRAVENOUS
  Filled 2016-06-28: qty 17

## 2016-06-28 NOTE — Progress Notes (Signed)
Tolerated infusion w/o adverse reaction.  Alert, in no distress.  Discharged ambulatory.

## 2016-06-28 NOTE — Patient Instructions (Signed)
[place] at [facility] Discharge Instructions  RECOMMENDATIONS MADE BY THE CONSULTANT AND ANY TEST RESULTS WILL BE SENT TO YOUR REFERRING PHYSICIAN.  Iron infusion today. Return as scheduled for lab work and office visit.  Thank you for choosing [place] at [facility] to provide your oncology and hematology care.  To afford each patient quality time with our provider, please arrive at least 15 minutes before your scheduled appointment time.   Beginning January 23rd 2017 lab work for the [facility] will be done in the  Main lab at [facility] on 1st floor. If you have a lab appointment with the [facility] please come in thru the  Main Entrance and check in at the main information desk  You need to re-schedule your appointment should you arrive 10 or more minutes late.  We strive to give you quality time with our providers, and arriving late affects you and other patients whose appointments are after yours.  Also, if you no show three or more times for appointments you may be dismissed from the clinic at the providers discretion.     Again, thank you for choosing [facility].  Our hope is that these requests will decrease the amount of time that you wait before being seen by our physicians.       _____________________________________________________________  Should you have questions after your visit to [facility], please contact our office at [phone] between the hours of 8:30 a.m. and 4:30 p.m.  Voicemails left after 4:30 p.m. will not be returned until the following business day.  For prescription refill requests, have your pharmacy contact our office.         Resources For Cancer Patients and their Caregivers ? American Cancer Society: Can assist with transportation, wigs, general needs, runs Look Good Feel Better.        564 043 6745 ? Cancer Care: Provides financial assistance, online  support groups, medication/co-pay assistance.  1-800-813-HOPE 906-797-9553) ? [place] Assists [place] cancer patients and their families through emotional , educational and financial support.  9860196192 ? [place] DSS Where to apply for food stamps, Medicaid and utility assistance. 5416409841 ? RCATS: Transportation to medical appointments. 9017241230 ? Social Security Administration: May apply for disability if have a Stage IV cancer. 501-142-1747 (540) 630-5007 ? [employer], Disability and Transit Services: Assists with nutrition, care and transit needs. [place] Support Programs: @10RELATIVEDAYS @ > Cancer Support Group  2nd Tuesday of the month 1pm-2pm, Journey Room  > Creative Journey  3rd Tuesday of the month 1130am-1pm, Journey Room  > Look Good Feel Better  1st Wednesday of the month 10am-12 noon, Journey Room (Call [place] to register [phone])

## 2016-07-13 ENCOUNTER — Encounter (HOSPITAL_COMMUNITY): Payer: Managed Care, Other (non HMO) | Attending: Hematology & Oncology | Admitting: Hematology & Oncology

## 2016-07-13 ENCOUNTER — Encounter (HOSPITAL_COMMUNITY): Payer: Self-pay | Admitting: Hematology & Oncology

## 2016-07-13 VITALS — BP 120/83 | HR 86 | Temp 97.9°F | Resp 16 | Wt 256.9 lb

## 2016-07-13 DIAGNOSIS — D500 Iron deficiency anemia secondary to blood loss (chronic): Secondary | ICD-10-CM

## 2016-07-13 DIAGNOSIS — D473 Essential (hemorrhagic) thrombocythemia: Secondary | ICD-10-CM

## 2016-07-13 DIAGNOSIS — R5383 Other fatigue: Secondary | ICD-10-CM

## 2016-07-13 DIAGNOSIS — D75839 Thrombocytosis, unspecified: Secondary | ICD-10-CM

## 2016-07-13 DIAGNOSIS — E559 Vitamin D deficiency, unspecified: Secondary | ICD-10-CM

## 2016-07-13 DIAGNOSIS — K909 Intestinal malabsorption, unspecified: Secondary | ICD-10-CM

## 2016-07-13 DIAGNOSIS — R61 Generalized hyperhidrosis: Secondary | ICD-10-CM

## 2016-07-13 DIAGNOSIS — D72829 Elevated white blood cell count, unspecified: Secondary | ICD-10-CM | POA: Diagnosis not present

## 2016-07-13 DIAGNOSIS — R197 Diarrhea, unspecified: Secondary | ICD-10-CM

## 2016-07-13 DIAGNOSIS — D509 Iron deficiency anemia, unspecified: Secondary | ICD-10-CM

## 2016-07-13 DIAGNOSIS — D270 Benign neoplasm of right ovary: Secondary | ICD-10-CM

## 2016-07-13 NOTE — Patient Instructions (Addendum)
[place] at [facility] Discharge Instructions  RECOMMENDATIONS MADE BY THE CONSULTANT AND ANY TEST RESULTS WILL BE SENT TO YOUR REFERRING PHYSICIAN.  You were seen today by Dr. Whitney Muse Lab work in March then every 4 months Follow up in clinic in 6 months   Thank you for choosing [place] at [facility] to provide your oncology and hematology care.  To afford each patient quality time with our provider, please arrive at least 15 minutes before your scheduled appointment time.    If you have a lab appointment with the [facility] please come in thru the  Main Entrance and check in at the main information desk  You need to re-schedule your appointment should you arrive 10 or more minutes late.  We strive to give you quality time with our providers, and arriving late affects you and other patients whose appointments are after yours.  Also, if you no show three or more times for appointments you may be dismissed from the clinic at the providers discretion.     Again, thank you for choosing [facility].  Our hope is that these requests will decrease the amount of time that you wait before being seen by our physicians.       _____________________________________________________________  Should you have questions after your visit to [facility], please contact our office at [phone] between the hours of 8:30 a.m. and 4:30 p.m.  Voicemails left after 4:30 p.m. will not be returned until the following business day.  For prescription refill requests, have your pharmacy contact our office.       Resources For Cancer Patients and their Caregivers ? American Cancer Society: Can assist with transportation, wigs, general needs, runs Look Good Feel Better.        587-697-7567 ? Cancer Care: Provides financial assistance, online support groups, medication/co-pay assistance.  1-800-813-HOPE 207 862 0433) ? [place] Assists [place] cancer patients and their families through emotional , educational and financial support.  9168187564 ? [place] DSS Where to apply for food stamps, Medicaid and utility assistance. 401-509-6588 ? [employer]: Transportation to medical appointments. 540 307 8251 ? Social Security Administration: May apply for disability if have a Stage IV cancer. 917-295-0224 [phone])638-2348 ? [employer], Disability and Transit Services: Assists with nutrition, care and transit needs. [place] Support Programs: @10RELATIVEDAYS @ > Cancer Support Group  2nd Tuesday of the month 1pm-2pm, Journey Room  > Creative Journey  3rd Tuesday of the month 1130am-1pm, Journey Room  > Look Good Feel Better  1st Wednesday of the month 10am-12 noon, Journey Room (Call [place] to register [phone])

## 2016-07-13 NOTE — Progress Notes (Signed)
Maureen Labrum, MD [address] [place] [address]  DIAGNOSIS:  Celiac Sprue.  Iron deficiency.  Gliadin IgG 92.5 units/mL 09/30/2013, tissue transglutaminase Ab IgA negative, Gliadin IgA negative  CURRENT THERAPY:IV iron prn  INTERVAL HISTORY: Maureen Yates 36 y.o. 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 She states she was diagnosed with celiac sprue by Dr. Stephenie Acres. She has seen Dr. Laural Golden in the past because of a family history of crohns disease.   Maureen Yates was here alone today.  She tore a couple of tendons and her plantar fascia in her right foot after stepping in a hole. She has been in a boot since the beginning of October. She sees Dr. Berline Lopes in [place] for this.   She got her iron and did well with it. She believes Dr. Stephenie Acres gave her iron every 4-6 months. He noticed sores in her mouth when her ferritin was less than 120.  Her arms have been aching. "Deep bone aches". Other than that, she feels great.  She has not been sleeping well due to her extreme night sweats. Her appetite has been good. She hasn't been able to be active because of her foot.   She still has diarrhea. It's about the same except for the occasional day that is worse.  She has her period every other month.  No pica or pagophagia.  MEDICAL HISTORY: Past Medical History:  Diagnosis Date  . Anxiety   . Environmental allergies   . GERD (gastroesophageal reflux disease)   . IBS (irritable bowel syndrome)   . Tubal ectopic pregnancy     has Seasonal allergies; Anemia; IBS (irritable bowel syndrome); Celiac disease; Cystadenoma of right ovary; Ectopic pregnancy, 03/26/2014; Vitamin D deficiency; Thoracic or lumbosacral neuritis or radiculitis; Encounter for other specified aftercare; Migraine with aura; Leukocytosis; Iron deficiency anemia; Hair loss; Esophageal reflux; and Anxiety state on her problem list.     is allergic to penicillins; casein; and augmentin  [amoxicillin-pot clavulanate].  Maureen Yates had no medications administered during this visit.  SURGICAL HISTORY: Past Surgical History:  Procedure Laterality Date  . CHOLECYSTECTOMY  Sept of 2012 gallstones  . COLONOSCOPY  02/27/2012   Procedure: COLONOSCOPY;  Surgeon: Rogene Houston, MD;  [facility];  Service: Endoscopy;  Laterality: N/A;  225  . rt ovary      removed 03/2010 for 4 pound tumor  . TUBAL LIGATION Bilateral 04/2013    SOCIAL HISTORY: Social History   Social History  . Marital status: Married    Spouse name: N/A  . Number of children: N/A  . Years of education: N/A   Occupational History  . Not on file.   Social History Main Topics  . Smoking status: Never Smoker  . Smokeless tobacco: Never Used  . Alcohol use No  . Drug use: No  . Sexual activity: Yes   Other Topics Concern  . Not on file   Social History Narrative  . No narrative on file    FAMILY HISTORY: Family History  Problem Relation Age of Onset  . Epilepsy Mother   . Epilepsy Brother   . Lupus Brother    Review of Systems  Constitutional: Negative.        Pos night sweats  HENT: Negative.   Eyes: Negative.   Respiratory: Negative.   Cardiovascular: Negative.   Gastrointestinal: Positive for diarrhea.  Genitourinary: Negative.   Musculoskeletal:       Arm aching pain.   Skin:  Negative.   Neurological: Negative.   Endo/Heme/Allergies: Negative.   Psychiatric/Behavioral: The patient has insomnia.   All other systems reviewed and are negative. 14 point review of systems was performed and is negative except as detailed under history of present illness   PHYSICAL EXAMINATION  ECOG PERFORMANCE STATUS: 0 - Asymptomatic  Vitals:   07/13/16 1209  BP: 120/83  Pulse: 86  Resp: 16  Temp: 97.9 F (36.6 C)     Physical Exam  Constitutional: She is oriented to person, place, and time and well-developed, well-nourished, and in no distress.  Pt was able to get on  exam table without assistance.   HENT:  Head: Normocephalic and atraumatic.  Eyes: EOM are normal. Pupils are equal, round, and reactive to light.  Neck: Normal range of motion. Neck supple.  Cardiovascular: Normal rate, regular rhythm and normal heart sounds.   Pulmonary/Chest: Effort normal and breath sounds normal.  Abdominal: Soft. Bowel sounds are normal. She exhibits no distension and no mass. There is no tenderness. There is no rebound and no guarding.  Musculoskeletal: Normal range of motion.  Lymphadenopathy:    She has no cervical adenopathy.  Neurological: She is alert and oriented to person, place, and time. Gait normal.  Skin: Skin is warm and dry.  Psychiatric: Mood, memory, affect and judgment normal.  Nursing note and vitals reviewed.   LABORATORY DATA: I have reviewed the data as listed  CBC    Component Value Date/Time   WBC 11.4 (H) 06/18/2016 1153   RBC 4.28 06/18/2016 1153   HGB 12.0 06/18/2016 1153   HCT 37.6 06/18/2016 1153   PLT 446 (H) 06/18/2016 1153   MCV 87.9 06/18/2016 1153   MCH 28.0 06/18/2016 1153   MCHC 31.9 06/18/2016 1153   RDW 15.2 06/18/2016 1153   LYMPHSABS 2.4 06/18/2016 1153   MONOABS 0.6 06/18/2016 1153   EOSABS 0.4 06/18/2016 1153   BASOSABS 0.0 06/18/2016 1153   CMP     Component Value Date/Time   NA 136 09/01/2015 1255   K 4.1 09/01/2015 1255   CL 102 09/01/2015 1255   CO2 28 09/01/2015 1255   GLUCOSE 90 09/01/2015 1255   BUN 13 09/01/2015 1255   CREATININE 0.78 09/01/2015 1255   CALCIUM 9.1 09/01/2015 1255   PROT 7.4 09/01/2015 1255   ALBUMIN 4.0 09/01/2015 1255   AST 20 09/01/2015 1255   ALT 26 09/01/2015 1255   ALKPHOS 78 09/01/2015 1255   BILITOT 0.1 (L) 09/01/2015 1255   GFRNONAA >60 09/01/2015 1255   GFRAA >60 09/01/2015 1255   Results for Maureen Yates, Maureen Yates (MRN [id number])   Ref. Range 06/18/2016 11:54  Ferritin Latest Ref Range: 11 - 307 ng/mL 90   ASSESSMENT and THERAPY PLAN:  Iron deficiency  anemia Intolerance to oral iron Vitamin D deficiency Persistent leukocytosis Thrombocytosis  36 year old with a history of iron deficiency. Dr. Stephenie Acres diagnosed her with celiac sprue based upon a positive IgG Gliadin Antibody test. TTG IgA was negative.  She has thrombocytosis, has been evaluated for MPD. Workup was negative. Platelet count today is 446K.   Check iron levels again in March.  She did receive a dose of feraheme in December for a ferritin of 90.   She will return for a follow up in March. If she is doing better then we can start her on a more normal schedule.    Orders Placed This Encounter  Procedures  . CBC with Differential    Standing Status:   Future  Standing Expiration Date:   07/13/2017  . Ferritin    Standing Status:   Future    Standing Expiration Date:   07/13/2017  . CBC with Differential    Standing Status:   Standing    Number of Occurrences:   6    Standing Expiration Date:   07/13/2017  . Ferritin    Standing Status:   Standing    Number of Occurrences:   6    Standing Expiration Date:   07/13/2017     All questions were answered. The patient knows to call the clinic with any problems, questions or concerns. We can certainly see the patient much sooner if necessary.  This document serves as a record of services personally performed by Ancil Linsey, MD. It was created on her behalf by Martinique Casey, a trained medical scribe. The creation of this record is based on the scribe's personal observations and the provider's statements to them. This document has been checked and approved by the attending provider.  I have reviewed the above documentation for accuracy and completeness and I agree with the above.  This note was electronically signed.   Ancil Linsey, MD

## 2016-08-08 ENCOUNTER — Encounter (HOSPITAL_COMMUNITY): Payer: Self-pay | Admitting: Hematology & Oncology

## 2016-08-16 ENCOUNTER — Other Ambulatory Visit (HOSPITAL_COMMUNITY): Payer: Self-pay | Admitting: Adult Health

## 2016-08-16 ENCOUNTER — Encounter (HOSPITAL_COMMUNITY): Payer: Self-pay | Admitting: Adult Health

## 2016-08-16 ENCOUNTER — Encounter (HOSPITAL_COMMUNITY): Payer: Managed Care, Other (non HMO) | Attending: Oncology

## 2016-08-16 DIAGNOSIS — E559 Vitamin D deficiency, unspecified: Secondary | ICD-10-CM | POA: Insufficient documentation

## 2016-08-16 DIAGNOSIS — R5383 Other fatigue: Secondary | ICD-10-CM | POA: Diagnosis not present

## 2016-08-16 DIAGNOSIS — D500 Iron deficiency anemia secondary to blood loss (chronic): Secondary | ICD-10-CM

## 2016-08-16 LAB — IRON AND TIBC
IRON: 46 ug/dL (ref 28–170)
SATURATION RATIOS: 13 % (ref 10.4–31.8)
TIBC: 349 ug/dL (ref 250–450)
UIBC: 303 ug/dL

## 2016-08-16 LAB — COMPREHENSIVE METABOLIC PANEL
ALK PHOS: 79 U/L (ref 38–126)
ALT: 24 U/L (ref 14–54)
ANION GAP: 8 (ref 5–15)
AST: 19 U/L (ref 15–41)
Albumin: 4.1 g/dL (ref 3.5–5.0)
BILIRUBIN TOTAL: 0.3 mg/dL (ref 0.3–1.2)
BUN: 9 mg/dL (ref 6–20)
CALCIUM: 8.9 mg/dL (ref 8.9–10.3)
CO2: 25 mmol/L (ref 22–32)
Chloride: 105 mmol/L (ref 101–111)
Creatinine, Ser: 0.69 mg/dL (ref 0.44–1.00)
GFR calc non Af Amer: >60 mL/min (ref >60)
Glucose, Bld: 111 mg/dL — ABNORMAL HIGH (ref 65–99)
Potassium: 3.2 mmol/L — ABNORMAL LOW (ref 3.5–5.1)
Sodium: 138 mmol/L (ref 135–145)
TOTAL PROTEIN: 7.5 g/dL (ref 6.5–8.1)

## 2016-08-16 LAB — CBC WITH DIFFERENTIAL/PLATELET
Basophils Absolute: 0 10*3/uL (ref 0.0–0.1)
Basophils Relative: 0 %
EOS ABS: 0.5 10*3/uL (ref 0.0–0.7)
Eosinophils Relative: 4 %
HEMATOCRIT: 37.8 % (ref 36.0–46.0)
HEMOGLOBIN: 12.4 g/dL (ref 12.0–15.0)
LYMPHS ABS: 2 10*3/uL (ref 0.7–4.0)
Lymphocytes Relative: 16 %
MCH: 28.8 pg (ref 26.0–34.0)
MCHC: 32.8 g/dL (ref 30.0–36.0)
MCV: 87.7 fL (ref 78.0–100.0)
MONO ABS: 0.3 10*3/uL (ref 0.1–1.0)
MONOS PCT: 3 %
NEUTROS ABS: 9.8 10*3/uL — AB (ref 1.7–7.7)
NEUTROS PCT: 77 %
Platelets: 418 10*3/uL — ABNORMAL HIGH (ref 150–400)
RBC: 4.31 MIL/uL (ref 3.87–5.11)
RDW: 15.3 % (ref 11.5–15.5)
WBC: 12.7 10*3/uL — ABNORMAL HIGH (ref 4.0–10.5)

## 2016-08-16 LAB — FERRITIN: Ferritin: 213 ng/mL (ref 11–307)

## 2016-08-16 LAB — FOLATE: FOLATE: 18.5 ng/mL (ref >5.9)

## 2016-08-16 LAB — VITAMIN B12: VITAMIN B 12: 555 pg/mL (ref 180–914)

## 2016-08-16 NOTE — Progress Notes (Signed)
Made aware by Lolita Lenz re: patient call reporting increased fatigue and "feeling run down."  She would like labs drawn today to recheck her iron and vitamin D levels.   Orders placed:  -Anemia panel, CBC with diff, CMET, vitamin D level, and TSH due to increased fatigue.   We will notify patient of results and bring her in for follow-up/treatment, as clinically indicated.   Mike Craze, NP [place] [phone]

## 2016-08-17 ENCOUNTER — Other Ambulatory Visit (HOSPITAL_COMMUNITY): Payer: Self-pay | Admitting: Adult Health

## 2016-08-17 DIAGNOSIS — E876 Hypokalemia: Secondary | ICD-10-CM

## 2016-08-17 DIAGNOSIS — E559 Vitamin D deficiency, unspecified: Secondary | ICD-10-CM

## 2016-08-17 LAB — VITAMIN D 25 HYDROXY (VIT D DEFICIENCY, FRACTURES): Vit D, 25-Hydroxy: 16.9 ng/mL — ABNORMAL LOW (ref 30.0–100.0)

## 2016-08-17 MED ORDER — VITAMIN D3 1.25 MG (50000 UT) PO CAPS: 1.0000 | ORAL_CAPSULE | ORAL | 0 refills | Status: DC

## 2016-08-17 MED ORDER — POTASSIUM CHLORIDE CRYS ER 20 MEQ PO TBCR: 20.0000 meq | EXTENDED_RELEASE_TABLET | Freq: Two times a day (BID) | ORAL | 2 refills | Status: DC

## 2016-09-14 ENCOUNTER — Other Ambulatory Visit (HOSPITAL_COMMUNITY): Payer: Managed Care, Other (non HMO)

## 2016-09-28 ENCOUNTER — Other Ambulatory Visit (HOSPITAL_COMMUNITY): Payer: Managed Care, Other (non HMO)

## 2016-09-28 ENCOUNTER — Ambulatory Visit (HOSPITAL_COMMUNITY): Payer: Managed Care, Other (non HMO) | Admitting: Adult Health

## 2016-10-02 ENCOUNTER — Other Ambulatory Visit (HOSPITAL_COMMUNITY): Payer: Self-pay | Admitting: Adult Health

## 2016-10-02 DIAGNOSIS — E559 Vitamin D deficiency, unspecified: Secondary | ICD-10-CM

## 2016-10-02 NOTE — Telephone Encounter (Signed)
She will be seen in mid-April in our office. -gwd

## 2016-10-16 ENCOUNTER — Other Ambulatory Visit (HOSPITAL_COMMUNITY): Payer: Self-pay | Admitting: Adult Health

## 2016-10-16 DIAGNOSIS — E559 Vitamin D deficiency, unspecified: Secondary | ICD-10-CM

## 2016-10-17 ENCOUNTER — Other Ambulatory Visit (HOSPITAL_COMMUNITY): Payer: Self-pay | Admitting: Adult Health

## 2016-10-17 DIAGNOSIS — E559 Vitamin D deficiency, unspecified: Secondary | ICD-10-CM

## 2016-10-17 LAB — TSH: TSH: 1.83 u[IU]/mL (ref <=5.90)

## 2016-10-17 NOTE — Telephone Encounter (Signed)
Please call patient; she needs to come in for vitamin D lab recheck. Please help me get her scheduled for lab appt. Orders placed.   Mike Craze, NP [place] [phone]

## 2016-10-18 NOTE — Telephone Encounter (Signed)
She is scheduled for labs and follow up on 10/23/16.

## 2016-10-22 NOTE — Progress Notes (Signed)
[place], [place] [address]   CLINIC:  Medical Oncology/Hematology  PCP:  [name], MD [address] [place] [address] [phone]   REASON FOR VISIT:  Follow-up for Iron deficiency anemia AND vitamin D deficiency  CURRENT THERAPY: IV iron prn AND oral vitamin D supplementation    HISTORY OF PRESENT ILLNESS:  (From Dr. Donald Pore last note on 07/13/16)      INTERVAL HISTORY:  [name] 36 y.o. female returns for follow-up for iron deficiency anemia and vitamin D deficiency.   Since her last in-person visit to the cancer center, she called the cancer center in 08/2016 stating that her energy levels were very low and felt like she needed some blood work done. Labs were collected, including iron studies and vitamin D.  She was noted to be vitamin D deficient at that time at 16.9. Prescription-strength vitamin D 50,000 IU po weekly was prescribed and she has been taking the vitamin D since that time.    Her last IV iron infusion was in 06/2016. States that she generally requires infusions a few times per year.  Has noted that her energy levels have been lower recently and just expresses an overall sensation of not feeling well for the past 2-3 months. She sees her PCP regularly; she was also referred to endocrinology and they are further evaluating her concerns as well.    Denies any blood in her stool/melena, hematuria, nosebleeds, or gingival bleeding. No pica. Her menstrual cycles have become more irregular with bleeding about every 3 weeks, when historically her periods were every other month.  Every other month menstrual cycles noted since she had 1 ovary removed for fibroadenoma.    IV Iron Administration Record:  Oncology Flowsheet 07/10/2013 05/24/2015 06/28/2016  ferric gluconate (NULECIT) IV -- 125 mg --  ferumoxytol (FERAHEME) IV 510 mg -- 510 mg     REVIEW OF SYSTEMS:  Review of Systems  Constitutional: Positive for  fatigue. Negative for appetite change, chills and fever.  HENT:  Negative.   Eyes: Negative.   Respiratory: Negative.  Negative for cough and shortness of breath.   Cardiovascular: Negative.  Negative for chest pain and leg swelling.  Gastrointestinal: Positive for constipation and diarrhea. Negative for abdominal pain, nausea and vomiting.  Endocrine: Negative.   Genitourinary: Positive for menstrual problem. Negative for dysuria and hematuria.   Musculoskeletal: Negative.   Skin: Negative.   Neurological: Negative.   Hematological: Negative.   Psychiatric/Behavioral: Negative.      PAST MEDICAL/SURGICAL HISTORY:  Past Medical History:  Diagnosis Date  . Anxiety   . Environmental allergies   . GERD (gastroesophageal reflux disease)   . IBS (irritable bowel syndrome)   . Tubal ectopic pregnancy    Past Surgical History:  Procedure Laterality Date  . CHOLECYSTECTOMY  Sept of 2012 gallstones  . COLONOSCOPY  02/27/2012   Procedure: COLONOSCOPY;  Surgeon: Rogene Houston, MD;  [facility];  Service: Endoscopy;  Laterality: N/A;  225  . rt ovary      removed 03/2010 for 4 pound tumor  . TUBAL LIGATION Bilateral 04/2013     SOCIAL HISTORY:  Social History   Social History  . Marital status: Married    Spouse name: N/A  . Number of children: N/A  . Years of education: N/A   Occupational History  . Not on file.   Social History Main Topics  . Smoking status: Never Smoker  .  Smokeless tobacco: Never Used  . Alcohol use No  . Drug use: No  . Sexual activity: Yes   Other Topics Concern  . Not on file   Social History Narrative  . No narrative on file    FAMILY HISTORY:  Family History  Problem Relation Age of Onset  . Epilepsy Mother   . Epilepsy Brother   . Lupus Brother     CURRENT MEDICATIONS:  Outpatient Encounter Prescriptions as of 10/23/2016  Medication Sig Note  . acetaminophen (TYLENOL) 500 MG tablet Take 500-1,000 mg by mouth every 6  (six) hours as needed. Pain   . Cholecalciferol (VITAMIN D3) 50000 units CAPS TAKE 1 TABLET BY MOUTH ONCE A WEEK.   . dicyclomine (BENTYL) 10 MG capsule Take 1 capsule (10 mg total) by mouth 4 (four) times daily -  before meals and at bedtime.   . fexofenadine (ALLEGRA) 180 MG tablet Take 180 mg by mouth daily.   [place] Kitchen FLUoxetine (PROZAC) 20 MG capsule Take 60 mg by mouth daily. 05/13/2015: Received from: External Pharmacy Received Sig:   . gabapentin (NEURONTIN) 100 MG capsule Take 100 mg by mouth daily.   Marland Kitchen lamoTRIgine (LAMICTAL) 100 MG tablet Take 100 mg by mouth daily.   [place] Kitchen omeprazole (PRILOSEC) 40 MG capsule Take 40 mg by mouth daily.   . potassium chloride SA (K-DUR,KLOR-CON) 20 MEQ tablet Take 1 tablet (20 mEq total) by mouth 2 (two) times daily.   [place] Kitchen PROAIR HFA 108 (90 Base) MCG/ACT inhaler INHALE 2 PUFFS INTO LUNGS EVERY FOUR TO SIX HOURS 07/13/2016: Received from: External Pharmacy  . hyoscyamine (LEVBID) 0.375 MG 12 hr tablet Take 1 tablet (0.375 mg total) by mouth daily. (Patient not taking: Reported on 11/11/2014)    No facility-administered encounter medications on file as of 10/23/2016.     ALLERGIES:  Allergies  Allergen Reactions  . Penicillins Rash  . Casein Other (See Comments)    G.I. Upset  . Augmentin [Amoxicillin-Pot Clavulanate] Swelling and Rash     PHYSICAL EXAM:  ECOG Performance status: 1 - Symptomatic, but independent.   Vitals:   10/23/16 0905  BP: 118/72  Pulse: 93  Resp: 18  Temp: 98.6 F (37 C)   Filed Weights   10/23/16 0905  Weight: 262 lb 3.2 oz (118.9 kg)    Physical Exam  Constitutional: She is oriented to person, place, and time and well-developed, well-nourished, and in no distress.  HENT:  Head: Normocephalic.  Mouth/Throat: Oropharynx is clear and moist. No oropharyngeal exudate.  Eyes: Conjunctivae are normal. Pupils are equal, round, and reactive to light. No scleral icterus.  Neck: Normal range of motion. Neck supple.  Cardiovascular:  Normal rate and regular rhythm.   Pulmonary/Chest: Effort normal and breath sounds normal. No respiratory distress.  Abdominal: Soft. Bowel sounds are normal. There is no tenderness. There is no rebound.  Musculoskeletal: Normal range of motion. She exhibits no edema.  Lymphadenopathy:    She has no cervical adenopathy.  Neurological: She is alert and oriented to person, place, and time. Gait normal.  Skin: Skin is warm and dry. No rash noted.  Psychiatric: Mood, memory, affect and judgment normal.  Nursing note and vitals reviewed.    LABORATORY DATA:  I have reviewed the labs as listed.  CBC    Component Value Date/Time   WBC 12.8 (H) 10/23/2016 0840   RBC 4.19 10/23/2016 0840   HGB 12.2 10/23/2016 0840   HCT 37.8 10/23/2016 0840   PLT 400 10/23/2016  0840   MCV 90.2 10/23/2016 0840   MCH 29.1 10/23/2016 0840   MCHC 32.3 10/23/2016 0840   RDW 15.2 10/23/2016 0840   LYMPHSABS 2.2 10/23/2016 0840   MONOABS 0.5 10/23/2016 0840   EOSABS 0.5 10/23/2016 0840   BASOSABS 0.1 10/23/2016 0840   CMP Latest Ref Rng & Units 08/16/2016 09/01/2015 05/13/2015  Glucose 65 - 99 mg/dL 111(H) 90 91  BUN 6 - 20 mg/dL 9 13 11   Creatinine 0.44 - 1.00 mg/dL 0.69 0.78 0.82  Sodium 135 - 145 mmol/L 138 136 140  Potassium 3.5 - 5.1 mmol/L 3.2(L) 4.1 3.9  Chloride 101 - 111 mmol/L 105 102 106  CO2 22 - 32 mmol/L 25 28 26   Calcium 8.9 - 10.3 mg/dL 8.9 9.1 9.1  Total Protein 6.5 - 8.1 g/dL 7.5 7.4 7.5  Total Bilirubin 0.3 - 1.2 mg/dL 0.3 0.1(L) 0.4  Alkaline Phos 38 - 126 U/L 79 78 78  AST 15 - 41 U/L 19 20 37  ALT 14 - 54 U/L 24 26 51    PENDING LABS:  Vitamin D, ferritin pending.   DIAGNOSTIC IMAGING:    PATHOLOGY:     ASSESSMENT & PLAN:   Iron deficiency anemia:  -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  TTG IgA was negative.   -Last IV iron was 06/2016. Ferritin in 08/2016 was adequate at 213.    -Ferritin pending for today.  Her hemoglobin is normal, which is reassuring. We will call her with results and make arrangements for IV iron, if clinically indicated.  -Return for labs only in 2 months.  -Return to cancer center in 4 months with repeat labs and follow-up visit.   Vitamin D deficiency:  -Certainly vitamin D deficiency can contribute to fatigue.  -Serum vitamin D low at 16.9 on 08/16/16. She was started on weekly vitamin D 50,000 IU at that time; has been on supplementation for a little over 8 weeks now.   -Discussed that if her serum vitamin D level remains low, then we will recommended she follow-up with her endocrinologist for further work-up of vitamin D deficiency.  -Results are pending; we will call her with results.    Leukocytosis:  -Has been chronic and largely unchanged. She has had myeloproliferative work-up in the past, which has been negative.  -Platelets, hemoglobin, and hematocrit normal today.   GI/GYN concerns:  -Recommended continued follow-up with specialists, as directed.  -For intermittent constipation, recommended she try 1 capful Miralax OTC once daily. Follow-up with Dr. Laural Golden as directed as well.    *Recommended continued follow-up with endocrinology. If her symptoms do not improve, then she may need referral to rheumatology for autoimmune disorder evaluation. Defer to endocrinology & PCP as they deem clinically appropriate.        Dispo:  -Labs only in 2 months (CBC with diff, CMET, vitamin D, ferritin, iron/TIBC).  -Return to cancer center in 4 months for follow-up visit and labs (CBC with diff, CMET, vitamin D, ferritin, iron/TIBC).    All questions were answered to patient's stated satisfaction. Encouraged patient to call with any new concerns or questions before her next visit to the cancer center and we can certain see her sooner, if needed.    Plan of care discussed with Dr. Talbert Cage, who agrees with the above aforementioned.    Orders  placed this encounter:  Orders Placed This Encounter  Procedures  .  CBC with Differential/Platelet  . Comprehensive metabolic panel  . Ferritin  . Iron and TIBC  . VITAMIN D 25 Hydroxy (Vit-D Deficiency, Fractures)      Mike Craze, NP [place] [phone]

## 2016-10-23 ENCOUNTER — Encounter (HOSPITAL_COMMUNITY): Payer: Self-pay | Admitting: Adult Health

## 2016-10-23 ENCOUNTER — Encounter (HOSPITAL_COMMUNITY): Payer: Managed Care, Other (non HMO) | Attending: Adult Health | Admitting: Adult Health

## 2016-10-23 ENCOUNTER — Encounter (HOSPITAL_COMMUNITY): Payer: Managed Care, Other (non HMO)

## 2016-10-23 VITALS — BP 118/72 | HR 93 | Temp 98.6°F | Resp 18 | Ht 68.0 in | Wt 262.2 lb

## 2016-10-23 DIAGNOSIS — E559 Vitamin D deficiency, unspecified: Secondary | ICD-10-CM

## 2016-10-23 DIAGNOSIS — F419 Anxiety disorder, unspecified: Secondary | ICD-10-CM | POA: Insufficient documentation

## 2016-10-23 DIAGNOSIS — K589 Irritable bowel syndrome without diarrhea: Secondary | ICD-10-CM | POA: Diagnosis not present

## 2016-10-23 DIAGNOSIS — Z880 Allergy status to penicillin: Secondary | ICD-10-CM | POA: Diagnosis not present

## 2016-10-23 DIAGNOSIS — D509 Iron deficiency anemia, unspecified: Secondary | ICD-10-CM | POA: Diagnosis not present

## 2016-10-23 DIAGNOSIS — K5900 Constipation, unspecified: Secondary | ICD-10-CM | POA: Diagnosis not present

## 2016-10-23 DIAGNOSIS — Z820 Family history of epilepsy and other diseases of the nervous system: Secondary | ICD-10-CM | POA: Diagnosis not present

## 2016-10-23 DIAGNOSIS — K900 Celiac disease: Secondary | ICD-10-CM | POA: Insufficient documentation

## 2016-10-23 DIAGNOSIS — D270 Benign neoplasm of right ovary: Secondary | ICD-10-CM

## 2016-10-23 DIAGNOSIS — K219 Gastro-esophageal reflux disease without esophagitis: Secondary | ICD-10-CM | POA: Diagnosis not present

## 2016-10-23 DIAGNOSIS — D72829 Elevated white blood cell count, unspecified: Secondary | ICD-10-CM | POA: Diagnosis not present

## 2016-10-23 DIAGNOSIS — Z79899 Other long term (current) drug therapy: Secondary | ICD-10-CM | POA: Diagnosis not present

## 2016-10-23 LAB — CBC WITH DIFFERENTIAL/PLATELET
BASOS PCT: 1 %
Basophils Absolute: 0.1 10*3/uL (ref 0.0–0.1)
Eosinophils Absolute: 0.5 10*3/uL (ref 0.0–0.7)
Eosinophils Relative: 4 %
HEMATOCRIT: 37.8 % (ref 36.0–46.0)
HEMOGLOBIN: 12.2 g/dL (ref 12.0–15.0)
Lymphocytes Relative: 18 %
Lymphs Abs: 2.2 10*3/uL (ref 0.7–4.0)
MCH: 29.1 pg (ref 26.0–34.0)
MCHC: 32.3 g/dL (ref 30.0–36.0)
MCV: 90.2 fL (ref 78.0–100.0)
MONOS PCT: 4 %
Monocytes Absolute: 0.5 10*3/uL (ref 0.1–1.0)
NEUTROS ABS: 9.5 10*3/uL — AB (ref 1.7–7.7)
NEUTROS PCT: 75 %
Platelets: 400 10*3/uL (ref 150–400)
RBC: 4.19 MIL/uL (ref 3.87–5.11)
RDW: 15.2 % (ref 11.5–15.5)
WBC: 12.8 10*3/uL — AB (ref 4.0–10.5)

## 2016-10-23 LAB — FERRITIN: Ferritin: 150 ng/mL (ref 11–307)

## 2016-10-23 NOTE — Patient Instructions (Addendum)
[place] at [facility] Discharge Instructions  RECOMMENDATIONS MADE BY THE CONSULTANT AND ANY TEST RESULTS WILL BE SENT TO YOUR REFERRING PHYSICIAN.  You were seen today by Mike Craze NP. Try taking Miralax one cap full daily for constipation, and try Vitamin E OTC at night for the hot flashes. Return in 2 months for labs. Return in 4 months for labs and follow up.   Thank you for choosing [place] at [facility] to provide your oncology and hematology care.  To afford each patient quality time with our provider, please arrive at least 15 minutes before your scheduled appointment time.    If you have a lab appointment with the [facility] please come in thru the  Main Entrance and check in at the main information desk  You need to re-schedule your appointment should you arrive 10 or more minutes late.  We strive to give you quality time with our providers, and arriving late affects you and other patients whose appointments are after yours.  Also, if you no show three or more times for appointments you may be dismissed from the clinic at the providers discretion.     Again, thank you for choosing [facility].  Our hope is that these requests will decrease the amount of time that you wait before being seen by our physicians.       _____________________________________________________________  Should you have questions after your visit to [facility], please contact our office at [phone] between the hours of 8:30 a.m. and 4:30 p.m.  Voicemails left after 4:30 p.m. will not be returned until the following business day.  For prescription refill requests, have your pharmacy contact our office.       Resources For Cancer Patients and their Caregivers ? American Cancer Society: Can assist with transportation, wigs, general needs, runs Look Good Feel Better.        ([phone] [phone] ? Cancer Care: Provides  financial assistance, online support groups, medication/co-pay assistance.  1-800-813-HOPE 325-065-7943) ? [place] Assists [place] cancer patients and their families through emotional , educational and financial support.  724-010-6618 ? [place] DSS Where to apply for food stamps, Medicaid and utility assistance. (715)602-5728 ? RCATS: Transportation to medical appointments. 660-580-3432 ? Social Security Administration: May apply for disability if have a Stage IV cancer. (228)176-5191 (414) 147-9965 ? [employer], Disability and Transit Services: Assists with nutrition, care and transit needs. [place] Support Programs: @10RELATIVEDAYS @ > Cancer Support Group  2nd Tuesday of the month 1pm-2pm, Journey Room  > Creative Journey  3rd Tuesday of the month 1130am-1pm, Journey Room  > Look Good Feel Better  1st Wednesday of the month 10am-12 noon, Journey Room (Call [place] to register [phone])

## 2016-10-24 LAB — VITAMIN D 25 HYDROXY (VIT D DEFICIENCY, FRACTURES): VIT D 25 HYDROXY: 36.4 ng/mL (ref 30.0–100.0)

## 2016-10-26 ENCOUNTER — Encounter (HOSPITAL_COMMUNITY): Payer: Self-pay | Admitting: Adult Health

## 2016-11-02 ENCOUNTER — Ambulatory Visit (INDEPENDENT_AMBULATORY_CARE_PROVIDER_SITE_OTHER): Payer: Managed Care, Other (non HMO) | Admitting: "Endocrinology

## 2016-11-02 ENCOUNTER — Encounter: Payer: Self-pay | Admitting: "Endocrinology

## 2016-11-02 VITALS — BP 127/83 | HR 91 | Ht 68.0 in | Wt 261.0 lb

## 2016-11-02 DIAGNOSIS — E6609 Other obesity due to excess calories: Secondary | ICD-10-CM | POA: Diagnosis not present

## 2016-11-02 DIAGNOSIS — E042 Nontoxic multinodular goiter: Secondary | ICD-10-CM | POA: Insufficient documentation

## 2016-11-02 DIAGNOSIS — Z6839 Body mass index (BMI) 39.0-39.9, adult: Secondary | ICD-10-CM | POA: Diagnosis not present

## 2016-11-02 DIAGNOSIS — Z6841 Body Mass Index (BMI) 40.0 and over, adult: Secondary | ICD-10-CM

## 2016-11-02 NOTE — Progress Notes (Signed)
Subjective:    Patient ID: Maureen Yates, female    DOB: [birth year], PCP [name], MD   Past Medical History:  Diagnosis Date  . Anxiety   . Environmental allergies   . GERD (gastroesophageal reflux disease)   . IBS (irritable bowel syndrome)   . Tubal ectopic pregnancy    Past Surgical History:  Procedure Laterality Date  . CHOLECYSTECTOMY  Sept of 2012 gallstones  . COLONOSCOPY  02/27/2012   Procedure: COLONOSCOPY;  Surgeon: Rogene Houston, MD;  [facility];  Service: Endoscopy;  Laterality: N/A;  225  . rt ovary      removed 03/2010 for 4 pound tumor  . TUBAL LIGATION Bilateral 04/2013   Social History   Social History  . Marital status: Married    Spouse name: N/A  . Number of children: N/A  . Years of education: N/A   Social History Main Topics  . Smoking status: Never Smoker  . Smokeless tobacco: Never Used  . Alcohol use No  . Drug use: No  . Sexual activity: Yes   Other Topics Concern  . None   Social History Narrative  . None   Outpatient Encounter Prescriptions as of 11/02/2016  Medication Sig  . fexofenadine (ALLEGRA) 180 MG tablet Take 180 mg by mouth daily.  Marland Kitchen FLUoxetine (PROZAC) 20 MG capsule Take 60 mg by mouth daily.  Marland Kitchen lamoTRIgine (LAMICTAL) 100 MG tablet Take 100 mg by mouth daily.  . [DISCONTINUED] acetaminophen (TYLENOL) 500 MG tablet Take 500-1,000 mg by mouth every 6 (six) hours as needed. Pain  . [DISCONTINUED] Cholecalciferol (VITAMIN D3) 50000 units CAPS TAKE 1 TABLET BY MOUTH ONCE A WEEK.  . [DISCONTINUED] dicyclomine (BENTYL) 10 MG capsule Take 1 capsule (10 mg total) by mouth 4 (four) times daily -  before meals and at bedtime.  . [DISCONTINUED] gabapentin (NEURONTIN) 100 MG capsule Take 100 mg by mouth daily.  . [DISCONTINUED] hyoscyamine (LEVBID) 0.375 MG 12 hr tablet Take 1 tablet (0.375 mg total) by mouth daily. (Patient not taking: Reported on 11/11/2014)  . [DISCONTINUED] omeprazole (PRILOSEC) 40 MG capsule  Take 40 mg by mouth daily.  . [DISCONTINUED] potassium chloride SA (K-DUR,KLOR-CON) 20 MEQ tablet Take 1 tablet (20 mEq total) by mouth 2 (two) times daily.  . [DISCONTINUED] PROAIR HFA 108 (90 Base) MCG/ACT inhaler INHALE 2 PUFFS INTO LUNGS EVERY FOUR TO SIX HOURS   No facility-administered encounter medications on file as of 11/02/2016.    ALLERGIES: Allergies  Allergen Reactions  . Penicillins Rash  . Casein Other (See Comments)    G.I. Upset  . Augmentin [Amoxicillin-Pot Clavulanate] Swelling and Rash    VACCINATION STATUS:  There is no immunization history on file for this patient.  HPI  36 year old female patient with medical history as above. She is being seen in consultation for multinodular goiter requested by Dr. Judd Lien. - She denies any prior history of thyroid dysfunction or any history of antithyroid therapy. She has family history of Graves' disease in one of her grandparents. -She was found to have multiple punctate partially cystic nodules on bilateral thyroid lobes in March 2018. She did have complete thyroid function test on 10/17/2016 which was significant for euthyroid status. -Patient has multiple nonspecific symptoms including progressive weight gain, anxiety, palpitations on and off, and fatigue. -She denies heat intolerance nor cold intolerance. She denies any dysphagia, shortness of breath, nor voice change. -She is on antianxiety medications including Prozac and Lamictal. -She reports she  has gained 10 pounds over the last year.  Review of Systems  Constitutional: + Aggressive weight gain of 10 pounds over one year, + fatigue, no subjective hyperthermia, no subjective hypothermia Eyes: no blurry vision, no xerophthalmia ENT: no sore throat, no nodules palpated in throat, no dysphagia/odynophagia, no hoarseness Cardiovascular: no Chest Pain, no Shortness of Breath, no palpitations, no leg swelling Respiratory: no cough, no SOB Gastrointestinal: no  Nausea/Vomiting/Diarhhea Musculoskeletal: no muscle/joint aches Skin: no rashes Neurological: + tremors, no numbness, no tingling, no dizziness Psychiatric: + depression, + anxiety  Objective:    BP 127/83   Pulse 91   Ht 5' 8"  (1.727 m)   Wt 261 lb (118.4 kg)   BMI 39.68 kg/m   Wt Readings from Last 3 Encounters:  11/02/16 261 lb (118.4 kg)  10/23/16 262 lb 3.2 oz (118.9 kg)  07/13/16 256 lb 14.4 oz (116.5 kg)    Physical Exam  Constitutional:  Significantly overweight for hight, not in acute distress, normal state of mind Eyes: PERRLA, EOMI, no exophthalmos ENT: moist mucous membranes, no thyromegaly, no cervical lymphadenopathy Cardiovascular: normal precordial activity, Regular Rate and Rhythm, no Murmur/Rubs/Gallops Respiratory:  adequate breathing efforts, no gross chest deformity, Clear to auscultation bilaterally Gastrointestinal: abdomen soft, Non -tender, No distension, Bowel Sounds present Musculoskeletal: no gross deformities, strength intact in all four extremities Skin: moist, warm, no rashes Neurological: + tremor with outstretched hands, Deep tendon reflexes normal in all four extremities.   CMP     Component Value Date/Time   NA 138 08/16/2016 1347   K 3.2 (L) 08/16/2016 1347   CL 105 08/16/2016 1347   CO2 25 08/16/2016 1347   GLUCOSE 111 (H) 08/16/2016 1347   BUN 9 08/16/2016 1347   CREATININE 0.69 08/16/2016 1347   CALCIUM 8.9 08/16/2016 1347   PROT 7.5 08/16/2016 1347   ALBUMIN 4.1 08/16/2016 1347   AST 19 08/16/2016 1347   ALT 24 08/16/2016 1347   ALKPHOS 79 08/16/2016 1347   BILITOT 0.3 08/16/2016 1347   GFRNONAA >60 08/16/2016 1347   GFRAA >60 08/16/2016 1347   Results for TONIANNE, FINE (MRN [id number]) as of 11/02/2016 11:03  Ref. Range 10/17/2016 00:00  TSH Latest Ref Range: 0.41 - 5.90 uIU/mL 1.83   October 17, 2016 lab work showed TPO antibodies normal at 14, thyroglobulin antibodies <1, free T4 1.6, TSH 1.83, free T3 3.0  Thyroid  sonograms from 09/24/2016 showed right lobe 4.4 cm left lobe 4.8 cm. There are multiple punctate/partially cystic nodules, largest 0.6 cm on the left lobe, and 0.4 cm on the right lobe.  Assessment & Plan:   1. Multinodular goiter  - This patient is being seen at the kind request of Dr. Judd Lien . - I have reviewed her available records and clinically evaluated this patient. - Based on this review was she does have euthyroid multinodular goiter. - She will not require immediate intervention at this time. I had a long discussion with her regarding future plan which will include repeat thyroid sonogram in  6 months to monitor nodular growth. If any of those nodules grow to more than 1 cm, she will be approached for ultrasound-guided fine needle aspiration. - She will also have repeat thyroid function test on a yearly basis. Her nonspecific symptoms are not attributable to thyroid dysfunction at this time. -She'll return with repeat thyroid/neck ultrasound in 5 month for office visit.  - I advised patient to maintain close follow up with [name], MD for primary  care needs. Follow up plan: Return in about 5 months (around 04/04/2017) for Thyroid / Neck Ultrasound.  Glade Lloyd, MD Phone: [phone]  Fax: [phone]   11/02/2016, 11:03 AM

## 2016-11-18 ENCOUNTER — Other Ambulatory Visit (HOSPITAL_COMMUNITY): Payer: Self-pay | Admitting: Adult Health

## 2016-11-18 DIAGNOSIS — E559 Vitamin D deficiency, unspecified: Secondary | ICD-10-CM

## 2016-12-24 ENCOUNTER — Encounter (HOSPITAL_COMMUNITY): Payer: Managed Care, Other (non HMO) | Attending: Oncology

## 2016-12-24 DIAGNOSIS — D72829 Elevated white blood cell count, unspecified: Secondary | ICD-10-CM

## 2016-12-24 DIAGNOSIS — E559 Vitamin D deficiency, unspecified: Secondary | ICD-10-CM | POA: Insufficient documentation

## 2016-12-24 DIAGNOSIS — D509 Iron deficiency anemia, unspecified: Secondary | ICD-10-CM | POA: Insufficient documentation

## 2016-12-24 LAB — COMPREHENSIVE METABOLIC PANEL
ALT: 25 U/L (ref 14–54)
AST: 22 U/L (ref 15–41)
Albumin: 3.9 g/dL (ref 3.5–5.0)
Alkaline Phosphatase: 71 U/L (ref 38–126)
Anion gap: 8 (ref 5–15)
BILIRUBIN TOTAL: 0.4 mg/dL (ref 0.3–1.2)
BUN: 10 mg/dL (ref 6–20)
CHLORIDE: 103 mmol/L (ref 101–111)
CO2: 24 mmol/L (ref 22–32)
CREATININE: 0.58 mg/dL (ref 0.44–1.00)
Calcium: 8.8 mg/dL — ABNORMAL LOW (ref 8.9–10.3)
Glucose, Bld: 99 mg/dL (ref 65–99)
POTASSIUM: 3.8 mmol/L (ref 3.5–5.1)
Sodium: 135 mmol/L (ref 135–145)
TOTAL PROTEIN: 7.4 g/dL (ref 6.5–8.1)

## 2016-12-24 LAB — CBC WITH DIFFERENTIAL/PLATELET
Basophils Absolute: 0.1 10*3/uL (ref 0.0–0.1)
Basophils Relative: 1 %
EOS PCT: 4 %
Eosinophils Absolute: 0.5 10*3/uL (ref 0.0–0.7)
HEMATOCRIT: 36.8 % (ref 36.0–46.0)
Hemoglobin: 12 g/dL (ref 12.0–15.0)
LYMPHS ABS: 2 10*3/uL (ref 0.7–4.0)
LYMPHS PCT: 18 %
MCH: 28.8 pg (ref 26.0–34.0)
MCHC: 32.6 g/dL (ref 30.0–36.0)
MCV: 88.2 fL (ref 78.0–100.0)
MONO ABS: 0.5 10*3/uL (ref 0.1–1.0)
Monocytes Relative: 4 %
NEUTROS ABS: 8 10*3/uL — AB (ref 1.7–7.7)
Neutrophils Relative %: 73 %
PLATELETS: 386 10*3/uL (ref 150–400)
RBC: 4.17 MIL/uL (ref 3.87–5.11)
RDW: 15.1 % (ref 11.5–15.5)
WBC: 11 10*3/uL — ABNORMAL HIGH (ref 4.0–10.5)

## 2016-12-24 LAB — IRON AND TIBC
IRON: 37 ug/dL (ref 28–170)
Saturation Ratios: 10 % — ABNORMAL LOW (ref 10.4–31.8)
TIBC: 356 ug/dL (ref 250–450)
UIBC: 319 ug/dL

## 2016-12-24 LAB — FERRITIN: FERRITIN: 158 ng/mL (ref 11–307)

## 2016-12-25 LAB — VITAMIN D 25 HYDROXY (VIT D DEFICIENCY, FRACTURES): VIT D 25 HYDROXY: 36.1 ng/mL (ref 30.0–100.0)

## 2017-01-18 ENCOUNTER — Ambulatory Visit (HOSPITAL_COMMUNITY): Payer: Managed Care, Other (non HMO)

## 2017-01-18 ENCOUNTER — Other Ambulatory Visit (HOSPITAL_COMMUNITY): Payer: Managed Care, Other (non HMO)

## 2017-02-21 ENCOUNTER — Encounter (HOSPITAL_COMMUNITY): Payer: Self-pay | Admitting: Adult Health

## 2017-02-21 ENCOUNTER — Encounter (HOSPITAL_BASED_OUTPATIENT_CLINIC_OR_DEPARTMENT_OTHER): Payer: Managed Care, Other (non HMO) | Admitting: Adult Health

## 2017-02-21 ENCOUNTER — Encounter (HOSPITAL_COMMUNITY): Payer: Managed Care, Other (non HMO) | Attending: Oncology

## 2017-02-21 VITALS — BP 117/76 | HR 85 | Resp 16 | Ht 67.0 in | Wt 262.0 lb

## 2017-02-21 DIAGNOSIS — G620 Drug-induced polyneuropathy: Secondary | ICD-10-CM | POA: Diagnosis not present

## 2017-02-21 DIAGNOSIS — D509 Iron deficiency anemia, unspecified: Secondary | ICD-10-CM

## 2017-02-21 DIAGNOSIS — D72829 Elevated white blood cell count, unspecified: Secondary | ICD-10-CM

## 2017-02-21 DIAGNOSIS — R5383 Other fatigue: Secondary | ICD-10-CM

## 2017-02-21 DIAGNOSIS — R51 Headache: Secondary | ICD-10-CM

## 2017-02-21 DIAGNOSIS — N951 Menopausal and female climacteric states: Secondary | ICD-10-CM

## 2017-02-21 DIAGNOSIS — E559 Vitamin D deficiency, unspecified: Secondary | ICD-10-CM

## 2017-02-21 DIAGNOSIS — E042 Nontoxic multinodular goiter: Secondary | ICD-10-CM | POA: Diagnosis not present

## 2017-02-21 LAB — CBC WITH DIFFERENTIAL/PLATELET
BASOS ABS: 0.1 10*3/uL (ref 0.0–0.1)
BASOS PCT: 0 %
Eosinophils Absolute: 0.3 10*3/uL (ref 0.0–0.7)
Eosinophils Relative: 2 %
HCT: 38.5 % (ref 36.0–46.0)
Hemoglobin: 12.4 g/dL (ref 12.0–15.0)
Lymphocytes Relative: 17 %
Lymphs Abs: 2.1 10*3/uL (ref 0.7–4.0)
MCH: 28.8 pg (ref 26.0–34.0)
MCHC: 32.2 g/dL (ref 30.0–36.0)
MCV: 89.3 fL (ref 78.0–100.0)
MONO ABS: 0.3 10*3/uL (ref 0.1–1.0)
Monocytes Relative: 3 %
NEUTROS ABS: 9.2 10*3/uL — AB (ref 1.7–7.7)
NEUTROS PCT: 78 %
PLATELETS: 382 10*3/uL (ref 150–400)
RBC: 4.31 MIL/uL (ref 3.87–5.11)
RDW: 14.7 % (ref 11.5–15.5)
WBC: 11.9 10*3/uL — AB (ref 4.0–10.5)

## 2017-02-21 LAB — COMPREHENSIVE METABOLIC PANEL
ALBUMIN: 4.1 g/dL (ref 3.5–5.0)
ALT: 28 U/L (ref 14–54)
ANION GAP: 10 (ref 5–15)
AST: 26 U/L (ref 15–41)
Alkaline Phosphatase: 82 U/L (ref 38–126)
BILIRUBIN TOTAL: 0.3 mg/dL (ref 0.3–1.2)
BUN: 9 mg/dL (ref 6–20)
CHLORIDE: 102 mmol/L (ref 101–111)
CO2: 27 mmol/L (ref 22–32)
Calcium: 9.1 mg/dL (ref 8.9–10.3)
Creatinine, Ser: 0.68 mg/dL (ref 0.44–1.00)
GFR calc Af Amer: >60 mL/min (ref >60)
GFR calc non Af Amer: >60 mL/min (ref >60)
GLUCOSE: 97 mg/dL (ref 65–99)
POTASSIUM: 3.9 mmol/L (ref 3.5–5.1)
SODIUM: 139 mmol/L (ref 135–145)
TOTAL PROTEIN: 7.5 g/dL (ref 6.5–8.1)

## 2017-02-21 LAB — FERRITIN: Ferritin: 138 ng/mL (ref 11–307)

## 2017-02-21 LAB — IRON AND TIBC
Iron: 41 ug/dL (ref 28–170)
SATURATION RATIOS: 11 % (ref 10.4–31.8)
TIBC: 363 ug/dL (ref 250–450)
UIBC: 322 ug/dL

## 2017-02-21 NOTE — Patient Instructions (Addendum)
[facility] at [facility] Discharge Instructions  RECOMMENDATIONS MADE BY THE CONSULTANT AND ANY TEST RESULTS WILL BE SENT TO YOUR REFERRING PHYSICIAN.  You were seen today by Gretchen Dawson NP. Return in 6 months for labs and follow up.  Thank you for choosing [facility] at [facility] to provide your oncology and hematology care.  To afford each patient quality time with our provider, please arrive at least 15 minutes before your scheduled appointment time.    If you have a lab appointment with the Cancer Center please come in thru the  Main Entrance and check in at the main information desk  You need to re-schedule your appointment should you arrive 10 or more minutes late.  We strive to give you quality time with our providers, and arriving late affects you and other patients whose appointments are after yours.  Also, if you no show three or more times for appointments you may be dismissed from the clinic at the providers discretion.     Again, thank you for choosing [facility].  Our hope is that these requests will decrease the amount of time that you wait before being seen by our physicians.       _____________________________________________________________  Should you have questions after your visit to [facility], please contact our office at [phone] between the hours of 8:30 a.m. and 4:30 p.m.  Voicemails left after 4:30 p.m. will not be returned until the following business day.  For prescription refill requests, have your pharmacy contact our office.       Resources For Cancer Patients and their Caregivers ? American Cancer Society: Can assist with transportation, wigs, general needs, runs Look Good Feel Better.        1-888-227-6333 ? Cancer Care: Provides financial assistance, online support groups, medication/co-pay assistance.  1-800-813-HOPE (4673) ? [facility] Assists [place] cancer patients and their families through emotional , educational and financial support.  336-427-4357 ? [place] DSS Where to apply for food stamps, Medicaid and utility assistance. 336-342-1394 ? RCATS: Transportation to medical appointments. 336-347-2287 ? Social Security Administration: May apply for disability if have a Stage IV cancer. 336-342-7796 1-800-772-1213 ? [place] Aging, Disability and Transit Services: Assists with nutrition, care and transit needs. 336-349-2343  Cancer Center Support Programs: @10RELATIVEDAYS@ > Cancer Support Group  2nd Tuesday of the month 1pm-2pm, Journey Room  > Creative Journey  3rd Tuesday of the month 1130am-1pm, Journey Room  > Look Good Feel Better  1st Wednesday of the month 10am-12 noon, Journey Room (Call American [employer] to register 1-800-395-5775)    

## 2017-02-21 NOTE — Progress Notes (Signed)
[place], [place] [address]   CLINIC:  Medical Oncology/Hematology  PCP:  [name], [place] [id number] [phone]   REASON FOR VISIT:  Follow-up for Iron deficiency anemia AND vitamin D deficiency  CURRENT THERAPY: IV iron prn AND oral vitamin D supplementation    HISTORY OF PRESENT ILLNESS:  (From Dr. Donald Pore last note on 07/13/16)      INTERVAL HISTORY:  Maureen Yates 36 y.o. female returns for follow-up for iron deficiency anemia and vitamin D deficiency.  She continues to have several symptoms, which have been chronic. 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   Drenching night sweats are occurring every night. Denies any unintentional weight loss, lymphadenopathy, abnormal bleeding/bruising, fever, or extreme severe fatigue.    Denies any bleeding episodes including blood in her stools or dark tarry stools.  She has menstrual bleeding every 3 weeks.  Last IV iron was in 06/2016. She shares with me that she can feel when her iron levels are getting low; states that when Dr. Stephenie Acres was at the cancer center, he would give her IV iron for ferritin <130 even if her hemoglobin was normal. She is wondering if her ferritin levels are getting closer to the 130s, because she feels like her iron stores are low now.   She has been seeing Dr. Dorris Fetch for thyroid nodules. States that although she has many symptoms of thyroid dysfunction, no medication can be started until her labs reflect changes.  She is scheduled to have another thyroid ultrasound in September; shares that biopsy of these nodules cannot be done "until they are bigger."     IV Iron Administration Record:  Oncology Flowsheet 07/10/2013 05/24/2015 06/28/2016  ferric gluconate (NULECIT) IV -- 125 mg --  ferumoxytol (FERAHEME) IV 510 mg --  510 mg     REVIEW OF SYSTEMS:  Review of Systems  Constitutional: Positive for diaphoresis and fatigue. Negative for appetite change, fever and unexpected weight change.  Eyes: Negative.   Respiratory: Negative.  Negative for cough and shortness of breath.   Cardiovascular: Negative.  Negative for chest pain and leg swelling.  Gastrointestinal: Negative.   Genitourinary: Negative for dysuria and hematuria.   Musculoskeletal: Negative.   Neurological: Positive for dizziness, headaches and numbness.  Hematological: Negative.  Negative for adenopathy. Does not bruise/bleed easily.  Psychiatric/Behavioral: Negative.      PAST MEDICAL/SURGICAL HISTORY:  Past Medical History:  Diagnosis Date  . Anxiety   . Environmental allergies   . GERD (gastroesophageal reflux disease)   . IBS (irritable bowel syndrome)   . Tubal ectopic pregnancy    Past Surgical History:  Procedure Laterality Date  . CHOLECYSTECTOMY  Sept of 2012 gallstones  . COLONOSCOPY  02/27/2012   Procedure: COLONOSCOPY;  Surgeon: Rogene Houston, MD;  [facility];  Service: Endoscopy;  Laterality: N/A;  225  . rt ovary      removed 03/2010 for 4 pound tumor  . TUBAL LIGATION Bilateral 04/2013     SOCIAL HISTORY:  Social History   Social History  . Marital status: Married    Spouse name: N/A  . Number of children: N/A  . Years of education: N/A   Occupational History  . Not on file.   Social History Main Topics  . Smoking status: Never Smoker  . Smokeless tobacco: Never Used  .  Alcohol use No  . Drug use: No  . Sexual activity: Yes   Other Topics Concern  . Not on file   Social History Narrative  . No narrative on file    FAMILY HISTORY:  Family History  Problem Relation Age of Onset  . Epilepsy Mother   . Epilepsy Brother   . Lupus Brother     CURRENT MEDICATIONS:  Outpatient Encounter Prescriptions as of 02/21/2017  Medication Sig Note  . gabapentin (NEURONTIN) 100 MG capsule  Take 100 mg by mouth daily.   . fexofenadine (ALLEGRA) 180 MG tablet Take 180 mg by mouth daily.   Marland Kitchen FLUoxetine (PROZAC) 20 MG capsule Take 60 mg by mouth daily. 05/13/2015: Received from: External Pharmacy Received Sig:   . lamoTRIgine (LAMICTAL) 100 MG tablet Take 100 mg by mouth daily.    No facility-administered encounter medications on file as of 02/21/2017.     ALLERGIES:  Allergies  Allergen Reactions  . Penicillins Rash  . Casein Other (See Comments)    G.I. Upset  . Augmentin [Amoxicillin-Pot Clavulanate] Swelling and Rash     PHYSICAL EXAM:  ECOG Performance status: 1 - Symptomatic, but independent.   Vitals:   02/21/17 1132  BP: 117/76  Pulse: 85  Resp: 16  SpO2: 96%   Filed Weights   02/21/17 1132  Weight: 262 lb (118.8 kg)    Physical Exam  Constitutional: She is oriented to person, place, and time and well-developed, well-nourished, and in no distress.  HENT:  Head: Normocephalic.  Mouth/Throat: Oropharynx is clear and moist. No oropharyngeal exudate.  Eyes: Pupils are equal, round, and reactive to light. Conjunctivae are normal. No scleral icterus.  Neck: Normal range of motion. Neck supple.  Cardiovascular: Normal rate, regular rhythm and normal heart sounds.   Pulmonary/Chest: Effort normal and breath sounds normal. No respiratory distress.  Abdominal: Soft. Bowel sounds are normal. There is no tenderness.  Musculoskeletal: Normal range of motion. She exhibits no edema.  Lymphadenopathy:    She has no cervical adenopathy.    She has no axillary adenopathy.       Right: No supraclavicular adenopathy present.       Left: No supraclavicular adenopathy present.  Neurological: She is alert and oriented to person, place, and time. No cranial nerve deficit. Gait normal.  Skin: Skin is warm and dry. No rash noted.  Psychiatric: Mood, memory, affect and judgment normal.  Nursing note and vitals reviewed.    LABORATORY DATA:  I have reviewed the labs as  listed.  CBC    Component Value Date/Time   WBC 11.9 (H) 02/21/2017 1040   RBC 4.31 02/21/2017 1040   HGB 12.4 02/21/2017 1040   HCT 38.5 02/21/2017 1040   PLT 382 02/21/2017 1040   MCV 89.3 02/21/2017 1040   MCH 28.8 02/21/2017 1040   MCHC 32.2 02/21/2017 1040   RDW 14.7 02/21/2017 1040   LYMPHSABS 2.1 02/21/2017 1040   MONOABS 0.3 02/21/2017 1040   EOSABS 0.3 02/21/2017 1040   BASOSABS 0.1 02/21/2017 1040   CMP Latest Ref Rng & Units 02/21/2017 12/24/2016 08/16/2016  Glucose 65 - 99 mg/dL 97 99 111(H)  BUN 6 - 20 mg/dL 9 10 9   Creatinine 0.44 - 1.00 mg/dL 0.68 0.58 0.69  Sodium 135 - 145 mmol/L 139 135 138  Potassium 3.5 - 5.1 mmol/L 3.9 3.8 3.2(L)  Chloride 101 - 111 mmol/L 102 103 105  CO2 22 - 32 mmol/L 27 24 25   Calcium 8.9 -  10.3 mg/dL 9.1 8.8(L) 8.9  Total Protein 6.5 - 8.1 g/dL 7.5 7.4 7.5  Total Bilirubin 0.3 - 1.2 mg/dL 0.3 0.4 0.3  Alkaline Phos 38 - 126 U/L 82 71 79  AST 15 - 41 U/L 26 22 19   ALT 14 - 54 U/L 28 25 24     PENDING LABS:  Vitamin D, ferritin pending.   DIAGNOSTIC IMAGING:    PATHOLOGY:     ASSESSMENT & PLAN:   Iron deficiency anemia:  -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  TTG IgA was negative.   -Last IV iron was 06/2016.  -Iron studies pending for today.  Her hemoglobin is normal at 12.4 g/dL, which is reassuring. We will call her with results and make arrangements for IV iron, if clinically indicated. I'm not sure that IV iron would help many of her symptoms, as they are not generally associated with iron deficiency anemia.  There is not good data to support giving IV iron for ferritin <130 with normal hemoglobin.  -Return to cancer center in 6 months for follow-up with labs.    Nonspecific symptoms:  -Unclear etiology of the myriad of symptoms she has been experiencing for the past several years.  Possibly endocrine or rheumatalgic; not thought  to be hematologic or oncologic.  -Discussed the option of seeking additional opinion and work-up from tertiary/teaching hospital. She wants to wait on lab results from today and pending follow-up with her endocrinologist before proceeding with referral to [facility]. Would consider referring her to rheumatology or endocrinology at [facility], if she chooses. She will contact us when she is ready for referral.   Vitamin D deficiency:  -Previously noted to be vitamin D deficient and treated with weekly vitamin D 50,000 UI at that time. Vitamin D normalized and she has been taking 2000 IU OTC vitamin D since that time.  -Serum vitamin D level pending for today. Certainly vitamin D deficiency can contribute to fatigue.   Leukocytosis:  -Has been chronic and largely unchanged. She has had myeloproliferative work-up in the past, which has been negative.  No unintentional weight loss or abnormal bleeding/bruising. Low clinical suspicion for chronic leukemia or lymphoma.  -WBCs only slightly elevated today at 11.9 with elevated ANC 9200 today.  -Platelets, hemoglobin, and hematocrit have remained normal.          Dispo:  -Return to cancer center in 6 months for follow-up with labs.     All questions were answered to patient's stated satisfaction. Encouraged patient to call with any new concerns or questions before her next visit to the cancer center and we can certain see her sooner, if needed.    Plan of care discussed with Dr. Talbert Cage, who agrees with the above aforementioned.    Orders placed this encounter:  Orders Placed This Encounter  Procedures  . CBC with Differential/Platelet  . Comprehensive metabolic panel  . Ferritin  . Iron and TIBC  . VITAMIN D 25 Hydroxy (Vit-D Deficiency, Fractures)      Mike Craze, NP [place] [phone]

## 2017-02-22 ENCOUNTER — Other Ambulatory Visit (HOSPITAL_COMMUNITY): Payer: Self-pay | Admitting: Adult Health

## 2017-02-22 DIAGNOSIS — E559 Vitamin D deficiency, unspecified: Secondary | ICD-10-CM

## 2017-02-22 LAB — VITAMIN D 25 HYDROXY (VIT D DEFICIENCY, FRACTURES): Vit D, 25-Hydroxy: 27 ng/mL — ABNORMAL LOW (ref 30.0–100.0)

## 2017-02-22 MED ORDER — ERGOCALCIFEROL 1.25 MG (50000 UT) PO CAPS: 50000.0000 [IU] | ORAL_CAPSULE | ORAL | 0 refills | Status: DC

## 2017-02-25 ENCOUNTER — Other Ambulatory Visit (HOSPITAL_COMMUNITY): Payer: Self-pay | Admitting: *Deleted

## 2017-02-28 ENCOUNTER — Other Ambulatory Visit (HOSPITAL_COMMUNITY): Payer: Self-pay | Admitting: Adult Health

## 2017-03-01 ENCOUNTER — Encounter (HOSPITAL_BASED_OUTPATIENT_CLINIC_OR_DEPARTMENT_OTHER): Payer: Managed Care, Other (non HMO)

## 2017-03-01 ENCOUNTER — Encounter (HOSPITAL_COMMUNITY): Payer: Self-pay

## 2017-03-01 ENCOUNTER — Ambulatory Visit (HOSPITAL_COMMUNITY): Payer: Managed Care, Other (non HMO)

## 2017-03-01 VITALS — BP 118/82 | HR 99 | Temp 98.2°F | Resp 18

## 2017-03-01 DIAGNOSIS — D509 Iron deficiency anemia, unspecified: Secondary | ICD-10-CM

## 2017-03-01 DIAGNOSIS — D500 Iron deficiency anemia secondary to blood loss (chronic): Secondary | ICD-10-CM

## 2017-03-01 MED ORDER — SODIUM CHLORIDE 0.9 % IV SOLN
INTRAVENOUS | Status: DC
  Administered 2017-03-01: 12:00:00 via INTRAVENOUS

## 2017-03-01 MED ORDER — SODIUM CHLORIDE 0.9 % IV SOLN
510.0000 mg | Freq: Once | INTRAVENOUS | Status: AC
  Administered 2017-03-01: 510 mg via INTRAVENOUS
  Filled 2017-03-01: qty 17

## 2017-03-01 NOTE — Progress Notes (Signed)
Tolerated infusion w/o adverse reaction.  Alert, in no distress.  VSS.  Discharged ambulatory.  

## 2017-03-13 ENCOUNTER — Ambulatory Visit (HOSPITAL_COMMUNITY)
Admission: RE | Admit: 2017-03-13 | Discharge: 2017-03-13 | Disposition: A | Discharge status: Home / Self Care | Payer: Managed Care, Other (non HMO) | Source: Ambulatory Visit | Attending: "Endocrinology | Admitting: "Endocrinology

## 2017-03-13 DIAGNOSIS — E042 Nontoxic multinodular goiter: Secondary | ICD-10-CM | POA: Diagnosis not present

## 2017-04-04 ENCOUNTER — Encounter: Payer: Self-pay | Admitting: "Endocrinology

## 2017-04-04 ENCOUNTER — Ambulatory Visit (INDEPENDENT_AMBULATORY_CARE_PROVIDER_SITE_OTHER): Payer: Managed Care, Other (non HMO) | Admitting: "Endocrinology

## 2017-04-04 VITALS — BP 134/84 | HR 92 | Ht 68.0 in | Wt 264.0 lb

## 2017-04-04 DIAGNOSIS — Z6839 Body mass index (BMI) 39.0-39.9, adult: Secondary | ICD-10-CM

## 2017-04-04 DIAGNOSIS — E6609 Other obesity due to excess calories: Secondary | ICD-10-CM

## 2017-04-04 DIAGNOSIS — E042 Nontoxic multinodular goiter: Secondary | ICD-10-CM | POA: Diagnosis not present

## 2017-04-04 DIAGNOSIS — E66812 Obesity, class 2: Secondary | ICD-10-CM

## 2017-04-04 NOTE — Progress Notes (Signed)
Subjective:    Patient ID: Maureen Yates, female    DOB: 20-Nov-1980, PCP Burdine, Virgina Evener, MD   Past Medical History:  Diagnosis Date  . Anxiety   . Environmental allergies   . GERD (gastroesophageal reflux disease)   . IBS (irritable bowel syndrome)   . Tubal ectopic pregnancy    Past Surgical History:  Procedure Laterality Date  . CHOLECYSTECTOMY  Sept of 2012 gallstones  . COLONOSCOPY  02/27/2012   Procedure: COLONOSCOPY;  Surgeon: Rogene Houston, MD;  [facility];  Service: Endoscopy;  Laterality: N/A;  225  . rt ovary      removed 03/2010 for 4 pound tumor  . TUBAL LIGATION Bilateral 04/2013   Social History   Social History  . Marital status: Married    Spouse name: N/A  . Number of children: N/A  . Years of education: N/A   Social History Main Topics  . Smoking status: Never Smoker  . Smokeless tobacco: Never Used  . Alcohol use No  . Drug use: No  . Sexual activity: Yes   Other Topics Concern  . None   Social History Narrative  . None   Outpatient Encounter Prescriptions as of 04/04/2017  Medication Sig  . ergocalciferol (VITAMIN D2) 50000 units capsule Take 1 capsule (50,000 Units total) by mouth once a week.  . fexofenadine (ALLEGRA) 180 MG tablet Take 180 mg by mouth daily.  Marland Kitchen FLUoxetine (PROZAC) 20 MG capsule Take 60 mg by mouth daily.  Marland Kitchen gabapentin (NEURONTIN) 100 MG capsule Take 100 mg by mouth daily.  Marland Kitchen lamoTRIgine (LAMICTAL) 100 MG tablet Take 100 mg by mouth daily.   No facility-administered encounter medications on file as of 04/04/2017.    ALLERGIES: Allergies  Allergen Reactions  . Penicillins Rash  . Casein Other (See Comments)    G.I. Upset  . Augmentin [Amoxicillin-Pot Clavulanate] Swelling and Rash    VACCINATION STATUS:  There is no immunization history on file for this patient.  HPI  36 year old female patient with medical history as above. She is being seen in Follow-up for  multinodular goiter. - She  denies any prior history of thyroid dysfunction or any history of antithyroid therapy. She has family history of Graves' disease in one of her grandparents. -She was found to have multiple punctate partially cystic nodules on bilateral thyroid lobes in March 2018.  Her repeat thyroid ultrasound from 03/13/2017 showed right lobe 5 cm width 0.6 cm nodule stable from prior study, left lobe stable at 4.8 cm with 0.5 cm stable nodule on the lower portion. -  She did have complete thyroid function test on 10/17/2016 which was significant for euthyroid status. -Patient has multiple nonspecific symptoms including progressive weight gain, anxiety, palpitations on and off, and fatigue. -She denies heat intolerance nor cold intolerance. She denies any dysphagia, shortness of breath, nor voice change. -She is on antianxiety medications including Prozac and Lamictal. -She  does not have any new complaints today.  Review of Systems  Constitutional: + Steady weight , + fatigue, no subjective hyperthermia, no subjective hypothermia Eyes: no blurry vision, no xerophthalmia ENT: no sore throat, no nodules palpated in throat, no dysphagia/odynophagia, no hoarseness Cardiovascular: no Chest Pain, no Shortness of Breath, no palpitations, no leg swelling Respiratory: no cough, no SOB Gastrointestinal: no Nausea/Vomiting/Diarhhea Musculoskeletal: no muscle/joint aches Skin: no rashes Neurological: - tremors, no numbness, no tingling, no dizziness Psychiatric: + depression, + anxiety  Objective:    BP 134/84  Pulse 92   Ht 5' 8"  (1.727 m)   Wt 264 lb (119.7 kg)   BMI 40.14 kg/m   Wt Readings from Last 3 Encounters:  04/04/17 264 lb (119.7 kg)  02/21/17 262 lb (118.8 kg)  11/02/16 261 lb (118.4 kg)    Physical Exam  Constitutional:  Significantly overweight for height, not in acute distress, normal state of mind Eyes: PERRLA, EOMI, no exophthalmos ENT: moist mucous membranes, + palpable thyroid , no  cervical lymphadenopathy Cardiovascular: normal precordial activity, Regular Rate and Rhythm, no Murmur/Rubs/Gallops Respiratory:  adequate breathing efforts, no gross chest deformity, Clear to auscultation bilaterally Gastrointestinal: abdomen soft, Non -tender, No distension, Bowel Sounds present Musculoskeletal: no gross deformities, strength intact in all four extremities Skin: moist, warm, no rashes Neurological: + tremor with outstretched hands, Deep tendon reflexes normal in all four extremities.   CMP     Component Value Date/Time   NA 139 02/21/2017 1040   K 3.9 02/21/2017 1040   CL 102 02/21/2017 1040   CO2 27 02/21/2017 1040   GLUCOSE 97 02/21/2017 1040   BUN 9 02/21/2017 1040   CREATININE 0.68 02/21/2017 1040   CALCIUM 9.1 02/21/2017 1040   PROT 7.5 02/21/2017 1040   ALBUMIN 4.1 02/21/2017 1040   AST 26 02/21/2017 1040   ALT 28 02/21/2017 1040   ALKPHOS 82 02/21/2017 1040   BILITOT 0.3 02/21/2017 1040   GFRNONAA >60 02/21/2017 1040   GFRAA >60 02/21/2017 1040   Results for Maureen Yates, Maureen Yates (MRN [id number]) as of 11/02/2016 11:03  Ref. Range 10/17/2016 00:00  TSH Latest Ref Range: 0.41 - 5.90 uIU/mL 1.83   October 17, 2016 lab work showed TPO antibodies normal at 14, thyroglobulin antibodies <1, free T4 1.6, TSH 1.83, free T3 3.0  Thyroid sonograms from 09/24/2016 showed right lobe 4.4 cm left lobe 4.8 cm. There are multiple punctate/partially cystic nodules, largest 0.6 cm on the left lobe, and 0.4 cm on the right lobe.  Thyroid ultrasound from 03/13/2017, right lobe 5 cm with 0.6 cm nodule, left lobe 4.8 cm with 0.5 cm stable nodule.  Assessment & Plan:   1. Multinodular goiter - Based on this review was she does have euthyroid multinodular goiter- which has been stable over more than 6 months.  - She will not require immediate intervention at this time, she does not require fine-needle aspiration of the small nodules at this time. - She will  have repeat thyroid  function test  in 6 months with office visit.  - Her nonspecific symptoms are not attributable to thyroid dysfunction at this time. - She may need another surveillance ultrasound of the thyroid in 2 years.   - I advised patient to maintain close follow up with Burdine, Virgina Evener, MD for primary care needs. Follow up plan: Return in about 6 months (around 10/02/2017) for follow up with pre-visit labs.  Glade Lloyd, MD Phone: [phone]  Fax: [phone]  This note was partially dictated with voice recognition software. Similar sounding words can be transcribed inadequately or may not  be corrected upon review.  04/04/2017, 1:23 PM

## 2017-07-19 ENCOUNTER — Inpatient Hospital Stay (HOSPITAL_COMMUNITY): Payer: Managed Care, Other (non HMO) | Attending: Oncology

## 2017-07-19 DIAGNOSIS — D500 Iron deficiency anemia secondary to blood loss (chronic): Secondary | ICD-10-CM | POA: Insufficient documentation

## 2017-07-19 DIAGNOSIS — E559 Vitamin D deficiency, unspecified: Secondary | ICD-10-CM | POA: Insufficient documentation

## 2017-07-19 DIAGNOSIS — D509 Iron deficiency anemia, unspecified: Secondary | ICD-10-CM

## 2017-07-19 DIAGNOSIS — D72829 Elevated white blood cell count, unspecified: Secondary | ICD-10-CM

## 2017-07-19 LAB — CBC WITH DIFFERENTIAL/PLATELET
Basophils Absolute: 0 10*3/uL (ref 0.0–0.1)
Basophils Relative: 0 %
Eosinophils Absolute: 0.4 10*3/uL (ref 0.0–0.7)
Eosinophils Relative: 4 %
HEMATOCRIT: 38.8 % (ref 36.0–46.0)
Hemoglobin: 12.2 g/dL (ref 12.0–15.0)
LYMPHS ABS: 2.1 10*3/uL (ref 0.7–4.0)
LYMPHS PCT: 18 %
MCH: 28.8 pg (ref 26.0–34.0)
MCHC: 31.4 g/dL (ref 30.0–36.0)
MCV: 91.7 fL (ref 78.0–100.0)
MONO ABS: 0.6 10*3/uL (ref 0.1–1.0)
MONOS PCT: 5 %
NEUTROS ABS: 8.5 10*3/uL — AB (ref 1.7–7.7)
Neutrophils Relative %: 73 %
Platelets: 371 10*3/uL (ref 150–400)
RBC: 4.23 MIL/uL (ref 3.87–5.11)
RDW: 14.7 % (ref 11.5–15.5)
WBC: 11.6 10*3/uL — ABNORMAL HIGH (ref 4.0–10.5)

## 2017-07-19 LAB — COMPREHENSIVE METABOLIC PANEL
ALK PHOS: 72 U/L (ref 38–126)
ALT: 26 U/L (ref 14–54)
ANION GAP: 10 (ref 5–15)
AST: 20 U/L (ref 15–41)
Albumin: 4 g/dL (ref 3.5–5.0)
BILIRUBIN TOTAL: 0.3 mg/dL (ref 0.3–1.2)
BUN: 10 mg/dL (ref 6–20)
CALCIUM: 9.1 mg/dL (ref 8.9–10.3)
CO2: 24 mmol/L (ref 22–32)
Chloride: 100 mmol/L — ABNORMAL LOW (ref 101–111)
Creatinine, Ser: 0.58 mg/dL (ref 0.44–1.00)
GFR calc Af Amer: >60 mL/min (ref >60)
Glucose, Bld: 126 mg/dL — ABNORMAL HIGH (ref 65–99)
POTASSIUM: 3.9 mmol/L (ref 3.5–5.1)
Sodium: 134 mmol/L — ABNORMAL LOW (ref 135–145)
TOTAL PROTEIN: 7.3 g/dL (ref 6.5–8.1)

## 2017-07-19 LAB — IRON AND TIBC
Iron: 50 ug/dL (ref 28–170)
Saturation Ratios: 14 % (ref 10.4–31.8)
TIBC: 350 ug/dL (ref 250–450)
UIBC: 300 ug/dL

## 2017-07-19 LAB — FERRITIN: FERRITIN: 206 ng/mL (ref 11–307)

## 2017-07-20 LAB — VITAMIN D 25 HYDROXY (VIT D DEFICIENCY, FRACTURES): Vit D, 25-Hydroxy: 22.6 ng/mL — ABNORMAL LOW (ref 30.0–100.0)

## 2017-07-21 ENCOUNTER — Other Ambulatory Visit (HOSPITAL_COMMUNITY): Payer: Self-pay | Admitting: Adult Health

## 2017-07-21 DIAGNOSIS — E559 Vitamin D deficiency, unspecified: Secondary | ICD-10-CM

## 2017-07-21 MED ORDER — ERGOCALCIFEROL 1.25 MG (50000 UT) PO CAPS: 50000.0000 [IU] | ORAL_CAPSULE | ORAL | 1 refills | Status: DC

## 2017-08-23 ENCOUNTER — Encounter (HOSPITAL_COMMUNITY): Payer: Self-pay | Admitting: Adult Health

## 2017-08-23 ENCOUNTER — Inpatient Hospital Stay (HOSPITAL_COMMUNITY): Payer: Managed Care, Other (non HMO) | Attending: Oncology | Admitting: Adult Health

## 2017-08-23 ENCOUNTER — Other Ambulatory Visit (HOSPITAL_COMMUNITY): Payer: Managed Care, Other (non HMO)

## 2017-08-23 VITALS — BP 124/82 | HR 105 | Temp 98.4°F | Resp 20 | Wt 267.5 lb

## 2017-08-23 DIAGNOSIS — I10 Essential (primary) hypertension: Secondary | ICD-10-CM | POA: Insufficient documentation

## 2017-08-23 DIAGNOSIS — R5383 Other fatigue: Secondary | ICD-10-CM | POA: Diagnosis not present

## 2017-08-23 DIAGNOSIS — R61 Generalized hyperhidrosis: Secondary | ICD-10-CM

## 2017-08-23 DIAGNOSIS — D72829 Elevated white blood cell count, unspecified: Secondary | ICD-10-CM | POA: Diagnosis not present

## 2017-08-23 DIAGNOSIS — R251 Tremor, unspecified: Secondary | ICD-10-CM | POA: Insufficient documentation

## 2017-08-23 DIAGNOSIS — G629 Polyneuropathy, unspecified: Secondary | ICD-10-CM

## 2017-08-23 DIAGNOSIS — R197 Diarrhea, unspecified: Secondary | ICD-10-CM | POA: Diagnosis not present

## 2017-08-23 DIAGNOSIS — E041 Nontoxic single thyroid nodule: Secondary | ICD-10-CM | POA: Diagnosis not present

## 2017-08-23 DIAGNOSIS — Z79899 Other long term (current) drug therapy: Secondary | ICD-10-CM

## 2017-08-23 DIAGNOSIS — G4700 Insomnia, unspecified: Secondary | ICD-10-CM | POA: Diagnosis not present

## 2017-08-23 DIAGNOSIS — R531 Weakness: Secondary | ICD-10-CM | POA: Diagnosis not present

## 2017-08-23 DIAGNOSIS — K219 Gastro-esophageal reflux disease without esophagitis: Secondary | ICD-10-CM | POA: Insufficient documentation

## 2017-08-23 DIAGNOSIS — K589 Irritable bowel syndrome without diarrhea: Secondary | ICD-10-CM | POA: Diagnosis not present

## 2017-08-23 DIAGNOSIS — K900 Celiac disease: Secondary | ICD-10-CM | POA: Diagnosis not present

## 2017-08-23 DIAGNOSIS — R42 Dizziness and giddiness: Secondary | ICD-10-CM

## 2017-08-23 DIAGNOSIS — E559 Vitamin D deficiency, unspecified: Secondary | ICD-10-CM | POA: Diagnosis not present

## 2017-08-23 DIAGNOSIS — R21 Rash and other nonspecific skin eruption: Secondary | ICD-10-CM | POA: Diagnosis not present

## 2017-08-23 DIAGNOSIS — K1379 Other lesions of oral mucosa: Secondary | ICD-10-CM | POA: Insufficient documentation

## 2017-08-23 DIAGNOSIS — D500 Iron deficiency anemia secondary to blood loss (chronic): Secondary | ICD-10-CM | POA: Insufficient documentation

## 2017-08-23 DIAGNOSIS — D509 Iron deficiency anemia, unspecified: Secondary | ICD-10-CM

## 2017-08-23 NOTE — Patient Instructions (Signed)
[place] at [facility] Discharge Instructions  RECOMMENDATIONS MADE BY THE CONSULTANT AND ANY TEST RESULTS WILL BE SENT TO YOUR REFERRING PHYSICIAN.  You saw Mike Craze, NP, today See Amy at checkout for appointments.   Thank you for choosing [place] at [facility] to provide your oncology and hematology care.  To afford each patient quality time with our provider, please arrive at least 15 minutes before your scheduled appointment time.    If you have a lab appointment with the [facility] please come in thru the  Main Entrance and check in at the main information desk  You need to re-schedule your appointment should you arrive 10 or more minutes late.  We strive to give you quality time with our providers, and arriving late affects you and other patients whose appointments are after yours.  Also, if you no show three or more times for appointments you may be dismissed from the clinic at the providers discretion.     Again, thank you for choosing [facility].  Our hope is that these requests will decrease the amount of time that you wait before being seen by our physicians.       _____________________________________________________________  Should you have questions after your visit to [facility], please contact our office at [phone] between the hours of 8:30 a.m. and 4:30 p.m.  Voicemails left after 4:30 p.m. will not be returned until the following business day.  For prescription refill requests, have your pharmacy contact our office.       Resources For Cancer Patients and their Caregivers ? American Cancer Society: Can assist with transportation, wigs, general needs, runs Look Good Feel Better.        670-613-3204 ? Cancer Care: Provides financial assistance, online support groups, medication/co-pay assistance.  1-800-813-HOPE 404-362-0730) ? [facility] Assists  [place] cancer patients and their families through emotional , educational and financial support.  (704)238-9590 ? [place] DSS Where to apply for food stamps, Medicaid and utility assistance. (727)161-0349 ? [facility]: Transportation to medical appointments. 951-038-4275 ? Social Security Administration: May apply for disability if have a Stage IV cancer. 561-437-2780 425-835-8431 ? [employer], Disability and Transit Services: Assists with nutrition, care and transit needs. [place] Support Programs: @10RELATIVEDAYS @ > Cancer Support Group  2nd Tuesday of the month 1pm-2pm, Journey Room  > Creative Journey  3rd Tuesday of the month 1130am-1pm, Journey Room  > Look Good Feel Better  1st Wednesday of the month 10am-12 noon, Journey Room (Call [facility] to register [phone])

## 2017-08-23 NOTE — Progress Notes (Signed)
[place], [place] [address]   CLINIC:  Medical Oncology/Hematology  PCP:  [name], MD [place] [id number] [phone]   REASON FOR VISIT:  Follow-up for Iron deficiency anemia AND vitamin D deficiency  CURRENT THERAPY: IV iron prn AND oral vitamin D 50,000 units weekly    HISTORY OF PRESENT ILLNESS:  (From Dr. Donald Pore last note on 07/13/16)       INTERVAL HISTORY:  Maureen Yates 37 y.o. female returns for follow-up for iron deficiency anemia and vitamin D deficiency.  Here today unaccompanied.   Overall, she tells me she has been feeling "okay."  Remains on weekly prescription strength vitamin D (50,000 units per week). Her labs were recently checked ~1 month ago because she called the clinic feeling more fatigued and symptomatic; labs did not reveal current iron deficiency.   Continues to have several symptoms, which are chronic and include: fatigue, mouth sores, diarrhea, nausea, rash/itching, weakness, dizziness, occasional headaches, peripheral neuropathy to arms/hands, sleep problems, and night sweats.    She tells me that that she recently has been "holding on to fluid."  Shares with me that she gained ~8 lbs overnight a few weeks ago.  Her diet has not changed; she does not eat a lot of foods high in sodium; she drinks plenty of water.  She has new periodic hand tremors and "full body tremors" at times; this is concerning for her. She does have h/o anxiety, but states that this sensation feels different for her.  She shares with me that she recently saw her endocrinologist, Dr. Dorris Fetch, who has stated that they cannot treat her symptoms "until my blood work shows what's wrong." She tells me that he suspects a portion of concerns may be d/t hyperthyroidism.  She does have thyroid nodules with goiter; she tells me she recently had repeat ultrasound and she was told the nodules appear bigger and there is more  inflammation in this area.  She becomes emotional talking about her symptoms and feeling overwhelmed by not feeling well all of the time.    Denies any bleeding episodes including blood in her stools or dark tarry stools.  She has menstrual bleeding every over month.  Last IV iron was in 02/2017. States that when Dr. Stephenie Acres was at the cancer center, he would give her IV iron for ferritin <130 even if her hemoglobin was normal. She is wondering if her ferritin levels are getting closer to the 130s, because she feels like her iron stores are low now.      REVIEW OF SYSTEMS:  Review of Systems  per HPI. 14-point ROS reviewed and negative except as stated above.    PAST MEDICAL/SURGICAL HISTORY:  Past Medical History:  Diagnosis Date  . Anxiety   . Environmental allergies   . GERD (gastroesophageal reflux disease)   . IBS (irritable bowel syndrome)   . Tubal ectopic pregnancy    Past Surgical History:  Procedure Laterality Date  . CHOLECYSTECTOMY  Sept of 2012 gallstones  . COLONOSCOPY  02/27/2012   Procedure: COLONOSCOPY;  Surgeon: Rogene Houston, MD;  [facility];  Service: Endoscopy;  Laterality: N/A;  225  . rt ovary      removed 03/2010 for 4 pound tumor  . TUBAL LIGATION Bilateral 04/2013     SOCIAL HISTORY:  Social History   Socioeconomic History  . Marital status: Married    Spouse name: Not on file  .  Number of children: Not on file  . Years of education: Not on file  . Highest education level: Not on file  Social Needs  . Financial resource strain: Not on file  . Food insecurity - worry: Not on file  . Food insecurity - inability: Not on file  . Transportation needs - medical: Not on file  . Transportation needs - non-medical: Not on file  Occupational History  . Not on file  Tobacco Use  . Smoking status: Never Smoker  . Smokeless tobacco: Never Used  Substance and Sexual Activity  . Alcohol use: No  . Drug use: No  . Sexual activity: Yes    Other Topics Concern  . Not on file  Social History Narrative  . Not on file    FAMILY HISTORY:  Family History  Problem Relation Age of Onset  . Epilepsy Mother   . Epilepsy Brother   . Lupus Brother     CURRENT MEDICATIONS:  Outpatient Encounter Medications as of 08/23/2017  Medication Sig Note  . ergocalciferol (VITAMIN D2) 50000 units capsule Take 1 capsule (50,000 Units total) by mouth once a week.   . fexofenadine (ALLEGRA) 180 MG tablet Take 180 mg by mouth daily.   Marland Kitchen FLUoxetine (PROZAC) 20 MG capsule Take 60 mg by mouth daily. 05/13/2015: Received from: External Pharmacy Received Sig:   . gabapentin (NEURONTIN) 100 MG capsule Take 100 mg by mouth daily.   Marland Kitchen lamoTRIgine (LAMICTAL) 100 MG tablet Take 100 mg by mouth daily.    No facility-administered encounter medications on file as of 08/23/2017.     ALLERGIES:  Allergies  Allergen Reactions  . Penicillins Rash  . Casein Other (See Comments)    G.I. Upset  . Augmentin [Amoxicillin-Pot Clavulanate] Swelling and Rash     PHYSICAL EXAM:  ECOG Performance status: 1 - Symptomatic, but independent.   Vitals:   08/23/17 1106  BP: 124/82  Pulse: (!) 105  Resp: 20  Temp: 98.4 F (36.9 C)  SpO2: 99%   Filed Weights   08/23/17 1106  Weight: 267 lb 8 oz (121.3 kg)    Physical Exam  Constitutional: She is oriented to person, place, and time and well-developed, well-nourished, and in no distress.  HENT:  Head: Normocephalic.  Mouth/Throat: Oropharynx is clear and moist. No oropharyngeal exudate.  Eyes: Conjunctivae are normal. Pupils are equal, round, and reactive to light. No scleral icterus.  Neck: Normal range of motion. Thyromegaly (thyroid nodules appreciated on exam ) present.  Cardiovascular: Regular rhythm.  Mild tachycardia  Pulmonary/Chest: Effort normal and breath sounds normal. She has no wheezes.  Abdominal: Soft. Bowel sounds are normal. There is no tenderness.  Musculoskeletal: Normal range of  motion. She exhibits edema (Trace hand and BLE/ankle non-pitting edema ).  Lymphadenopathy:    She has no cervical adenopathy.    She has no axillary adenopathy.       Right: No supraclavicular adenopathy present.       Left: No supraclavicular adenopathy present.  Neurological: She is alert and oriented to person, place, and time. No cranial nerve deficit. Gait normal.  Skin: Skin is warm and dry.  Psychiatric: Mood, memory, affect and judgment normal.  Nursing note and vitals reviewed.    LABORATORY DATA:  I have reviewed the labs as listed.  CBC    Component Value Date/Time   WBC 11.6 (H) 07/19/2017 1300   RBC 4.23 07/19/2017 1300   HGB 12.2 07/19/2017 1300   HCT 38.8 07/19/2017 1300  PLT 371 07/19/2017 1300   MCV 91.7 07/19/2017 1300   MCH 28.8 07/19/2017 1300   MCHC 31.4 07/19/2017 1300   RDW 14.7 07/19/2017 1300   LYMPHSABS 2.1 07/19/2017 1300   MONOABS 0.6 07/19/2017 1300   EOSABS 0.4 07/19/2017 1300   BASOSABS 0.0 07/19/2017 1300   CMP Latest Ref Rng & Units 07/19/2017 02/21/2017 12/24/2016  Glucose 65 - 99 mg/dL 126(H) 97 99  BUN 6 - 20 mg/dL 10 9 10   Creatinine 0.44 - 1.00 mg/dL 0.58 0.68 0.58  Sodium 135 - 145 mmol/L 134(L) 139 135  Potassium 3.5 - 5.1 mmol/L 3.9 3.9 3.8  Chloride 101 - 111 mmol/L 100(L) 102 103  CO2 22 - 32 mmol/L 24 27 24   Calcium 8.9 - 10.3 mg/dL 9.1 9.1 8.8(L)  Total Protein 6.5 - 8.1 g/dL 7.3 7.5 7.4  Total Bilirubin 0.3 - 1.2 mg/dL 0.3 0.3 0.4  Alkaline Phos 38 - 126 U/L 72 82 71  AST 15 - 41 U/L 20 26 22   ALT 14 - 54 U/L 26 28 25     PENDING LABS:    DIAGNOSTIC IMAGING:    PATHOLOGY:     ASSESSMENT & PLAN:   Iron deficiency anemia:  -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  TTG IgA was negative.   -Last IV iron was 02/2017. Oncology Flowsheet 03/01/2017  ferumoxytol [facility]) IV 510 mg  -Labs from 07/19/17 reviewed with patient  today.  Hgb normal 12/2 g/dL. Iron studies are excellent; ferritin 206, iron sats normal at 14%, TIBC normal.  I do not think she needs any additional IV iron at this time.  Her goal ferritin is >130 based on Dr. Chapman Moss previous recommendations given pt becomes symptomatic with lower ferritin levels.  -Labs only in 3 months.  -Return to cancer center in 6 months for follow-up with labs.    Nonspecific symptoms:  -Unclear etiology of the myriad of symptoms she has been experiencing for the past several years.  Possibly endocrine or rheumatalgic; not thought to be hematologic or oncologic.  It would be very rare for a patient her age to have a carcinoid tumor, however, we can screen with serotonin and chromogranin A levels with future labs (of note: we did not talk about this during office visit, but we can discuss further at next visit if screening is positive).  -Discussed again the option of having her seek additional opinion from a teaching hospital, like [facility].  She is still considering this; I am happy to help facilitate this referral for her in the future if she likes.  She is still thinking about this option for now.   Vitamin D deficiency:  -Most recent vitamin D low at 22.6 on 07/19/17. Continue vitamin D 50,000 units weekly.   Leukocytosis:  -Has been chronic and largely unchanged. She has had myeloproliferative work-up in the past, which has been negative.  -WBCs only slightly elevated at 11.6 with elevated ANC 8500 on recent labs.  -Platelets, hemoglobin, and hematocrit have remained normal.          Dispo:  -Labs only in 3 months to ensure stability. (CBC with diff, iron studies, & vitamin D) -Return to cancer center in 6 months for follow-up with labs. (CBC with diff, CMET, iron studies, vitamin D, serotonin, & chromogranin A)    All questions were answered to patient's stated satisfaction. Encouraged patient  to call with any new concerns or  questions before her next visit to the cancer center and we can certain see her sooner, if needed.      Orders placed this encounter:  Orders Placed This Encounter  Procedures  . CBC with Differential/Platelet  . Comprehensive metabolic panel  . Serotonin serum  . Chromogranin A  . VITAMIN D 25 Hydroxy (Vit-D Deficiency, Fractures)  . Ferritin  . Iron and TIBC      Mike Craze, NP [place] [phone]

## 2017-08-26 LAB — TSH: TSH: 1.82 mIU/L

## 2017-08-26 LAB — T4, FREE: FREE T4: 1.1 ng/dL (ref 0.8–1.8)

## 2017-09-02 ENCOUNTER — Encounter: Payer: Self-pay | Admitting: "Endocrinology

## 2017-09-02 ENCOUNTER — Ambulatory Visit (INDEPENDENT_AMBULATORY_CARE_PROVIDER_SITE_OTHER): Payer: Managed Care, Other (non HMO) | Admitting: "Endocrinology

## 2017-09-02 VITALS — BP 124/83 | HR 92 | Ht 68.0 in | Wt 269.0 lb

## 2017-09-02 DIAGNOSIS — E063 Autoimmune thyroiditis: Secondary | ICD-10-CM | POA: Insufficient documentation

## 2017-09-02 DIAGNOSIS — E6609 Other obesity due to excess calories: Secondary | ICD-10-CM | POA: Diagnosis not present

## 2017-09-02 DIAGNOSIS — Z6839 Body mass index (BMI) 39.0-39.9, adult: Secondary | ICD-10-CM | POA: Diagnosis not present

## 2017-09-02 DIAGNOSIS — E042 Nontoxic multinodular goiter: Secondary | ICD-10-CM

## 2017-09-02 NOTE — Progress Notes (Signed)
Subjective:    Patient ID: Maureen Yates, female    DOB: 1980/12/12, PCP Burdine, Virgina Evener, MD   Past Medical History:  Diagnosis Date  . Anxiety   . Environmental allergies   . GERD (gastroesophageal reflux disease)   . IBS (irritable bowel syndrome)   . Tubal ectopic pregnancy    Past Surgical History:  Procedure Laterality Date  . CHOLECYSTECTOMY  Sept of 2012 gallstones  . COLONOSCOPY  02/27/2012   Procedure: COLONOSCOPY;  Surgeon: Rogene Houston, MD;  [facility];  Service: Endoscopy;  Laterality: N/A;  225  . rt ovary      removed 03/2010 for 4 pound tumor  . TUBAL LIGATION Bilateral 04/2013   Social History   Socioeconomic History  . Marital status: Married    Spouse name: None  . Number of children: None  . Years of education: None  . Highest education level: None  Social Needs  . Financial resource strain: None  . Food insecurity - worry: None  . Food insecurity - inability: None  . Transportation needs - medical: None  . Transportation needs - non-medical: None  Occupational History  . None  Tobacco Use  . Smoking status: Never Smoker  . Smokeless tobacco: Never Used  Substance and Sexual Activity  . Alcohol use: No  . Drug use: No  . Sexual activity: Yes  Other Topics Concern  . None  Social History Narrative  . None   Outpatient Encounter Medications as of 09/02/2017  Medication Sig  . ergocalciferol (VITAMIN D2) 50000 units capsule Take 1 capsule (50,000 Units total) by mouth once a week.  . fexofenadine (ALLEGRA) 180 MG tablet Take 180 mg by mouth daily.  Marland Kitchen FLUoxetine (PROZAC) 20 MG capsule Take 60 mg by mouth daily.  Marland Kitchen gabapentin (NEURONTIN) 100 MG capsule Take 100 mg by mouth daily.  Marland Kitchen lamoTRIgine (LAMICTAL) 100 MG tablet Take 100 mg by mouth daily.   No facility-administered encounter medications on file as of 09/02/2017.    ALLERGIES: Allergies  Allergen Reactions  . Penicillins Rash  . Casein Other (See Comments)   G.I. Upset  . Augmentin [Amoxicillin-Pot Clavulanate] Swelling and Rash    VACCINATION STATUS:  There is no immunization history on file for this patient.  HPI  38 year old female patient with medical history as above. She is being seen in Follow-up for  multinodular goiter. -She has lab evidence of  celiac disease not on treatment.  She is on treatment for pernicious anemia. -  She has family history of Graves' disease in one of her grandparents. -She was found to have multiple punctate partially cystic nodules on bilateral thyroid lobes in March 2018.  Her repeat thyroid ultrasound from 03/13/2017 showed right lobe 5 cm width 0.6 cm nodule stable from prior study, left lobe stable at 4.8 cm with 0.5 cm stable nodule on the lower portion. -  She did have complete thyroid function test on 10/17/2016 which was significant for euthyroid status, however positive for thyroid peroxidase antibodies. -Patient has multiple nonspecific symptoms including progressive weight gain, anxiety, palpitations on and off, and fatigue. -She denies heat intolerance nor cold intolerance. She denies any dysphagia, shortness of breath, nor voice change. -She is on antianxiety medications including Prozac and Lamictal. -She  does not have any new complaints today.  Review of Systems   Constitutional: + Steady weight gain , + fatigue, no subjective hyperthermia, no subjective hypothermia Eyes: no blurry vision, no xerophthalmia ENT: no  sore throat, no nodules palpated in throat, no dysphagia/odynophagia, no hoarseness Cardiovascular: no Chest Pain, no Shortness of Breath, no palpitations, no leg swelling Respiratory: no cough, no SOB Gastrointestinal: no Nausea/Vomiting/Diarhhea Musculoskeletal: no muscle/joint aches Skin: no rashes Neurological: - tremors, no numbness, no tingling, no dizziness Psychiatric: + depression, + anxiety  Objective:    BP 124/83   Pulse 92   Ht 5' 8"  (1.727 m)   Wt 269 lb  (122 kg)   BMI 40.90 kg/m   Wt Readings from Last 3 Encounters:  09/02/17 269 lb (122 kg)  08/23/17 267 lb 8 oz (121.3 kg)  04/04/17 264 lb (119.7 kg)    Physical Exam  Constitutional:  + Obese, not in acute distress, normal state of mind Eyes: PERRLA, EOMI, no exophthalmos ENT: moist mucous membranes, + palpable thyroid , no cervical lymphadenopathy Cardiovascular: normal precordial activity, Regular Rate and Rhythm, no Murmur/Rubs/Gallops Respiratory:  adequate breathing efforts, no gross chest deformity, Clear to auscultation bilaterally Gastrointestinal: abdomen soft, Non -tender, No distension, Bowel Sounds present Musculoskeletal: no gross deformities, strength intact in all four extremities Skin: moist, warm, no rashes Neurological: + tremor with outstretched hands, Deep tendon reflexes normal in all four extremities.   CMP     Component Value Date/Time   NA 134 (L) 07/19/2017 1300   K 3.9 07/19/2017 1300   CL 100 (L) 07/19/2017 1300   CO2 24 07/19/2017 1300   GLUCOSE 126 (H) 07/19/2017 1300   BUN 10 07/19/2017 1300   CREATININE 0.58 07/19/2017 1300   CALCIUM 9.1 07/19/2017 1300   PROT 7.3 07/19/2017 1300   ALBUMIN 4.0 07/19/2017 1300   AST 20 07/19/2017 1300   ALT 26 07/19/2017 1300   ALKPHOS 72 07/19/2017 1300   BILITOT 0.3 07/19/2017 1300   GFRNONAA >60 07/19/2017 1300   GFRAA >60 07/19/2017 1300   Results for Maureen Yates, Maureen Yates (MRN [id number]) as of 09/02/2017 16:31  Ref. Range 08/26/2017 10:33  TSH Latest Units: mIU/L 1.82  T4,Free(Direct) Latest Ref Range: 0.8 - 1.8 ng/dL 1.1     October 17, 2016 lab work showed TPO antibodies normal at 14, thyroglobulin antibodies <1, free T4 1.6, TSH 1.83, free T3 3.0  Thyroid sonograms from 09/24/2016 showed right lobe 4.4 cm left lobe 4.8 cm. There are multiple punctate/partially cystic nodules, largest 0.6 cm on the left lobe, and 0.4 cm on the right lobe.  Thyroid ultrasound from 03/13/2017, right lobe 5 cm with 0.6  cm nodule, left lobe 4.8 cm with 0.5 cm stable nodule.  Assessment & Plan:   1.  Euthyroid multinodular goiter -Her repeat thyroid function tests are within normal limits . -  she does have euthyroid multinodular goiter- which has been stable over more than 12 months.  - She will not require immediate intervention at this time, she does not require fine-needle aspiration of the small nodules at this time. - She will  have repeat thyroid function test  in 6 months with office visit.  - Her nonspecific symptoms are not attributable to thyroid dysfunction at this time. - She may need another surveillance ultrasound of the thyroid in 1 years.  -Given her elevated IgG for delaminated gliadin, suggestion is made for her to minimize exposure to gluten.  - I advised patient to maintain close follow up with Burdine, Virgina Evener, MD for primary care needs. Follow up plan: Return in about 6 months (around 03/02/2018).  Glade Lloyd, MD Phone: [phone]  Fax: [phone]  This note was partially dictated  with voice recognition software. Similar sounding words can be transcribed inadequately or may not  be corrected upon review.  09/02/2017, 4:28 PM

## 2017-10-02 ENCOUNTER — Ambulatory Visit: Payer: Managed Care, Other (non HMO) | Admitting: "Endocrinology

## 2017-10-08 ENCOUNTER — Telehealth (HOSPITAL_COMMUNITY): Payer: Self-pay | Admitting: Adult Health

## 2017-11-22 ENCOUNTER — Inpatient Hospital Stay (HOSPITAL_COMMUNITY): Payer: Managed Care, Other (non HMO) | Attending: Hematology

## 2017-11-22 DIAGNOSIS — R5383 Other fatigue: Secondary | ICD-10-CM

## 2017-11-22 DIAGNOSIS — D509 Iron deficiency anemia, unspecified: Secondary | ICD-10-CM | POA: Diagnosis not present

## 2017-11-22 DIAGNOSIS — E559 Vitamin D deficiency, unspecified: Secondary | ICD-10-CM

## 2017-11-22 LAB — CBC WITH DIFFERENTIAL/PLATELET
BASOS ABS: 0 10*3/uL (ref 0.0–0.1)
BASOS PCT: 0 %
EOS ABS: 0.3 10*3/uL (ref 0.0–0.7)
EOS PCT: 3 %
HEMATOCRIT: 38.7 % (ref 36.0–46.0)
Hemoglobin: 12.6 g/dL (ref 12.0–15.0)
Lymphocytes Relative: 17 %
Lymphs Abs: 1.9 10*3/uL (ref 0.7–4.0)
MCH: 29.5 pg (ref 26.0–34.0)
MCHC: 32.6 g/dL (ref 30.0–36.0)
MCV: 90.6 fL (ref 78.0–100.0)
MONO ABS: 0.5 10*3/uL (ref 0.1–1.0)
Monocytes Relative: 4 %
NEUTROS ABS: 8.9 10*3/uL — AB (ref 1.7–7.7)
Neutrophils Relative %: 76 %
PLATELETS: 397 10*3/uL (ref 150–400)
RBC: 4.27 MIL/uL (ref 3.87–5.11)
RDW: 15 % (ref 11.5–15.5)
WBC: 11.7 10*3/uL — ABNORMAL HIGH (ref 4.0–10.5)

## 2017-11-22 LAB — IRON AND TIBC
Iron: 48 ug/dL (ref 28–170)
Saturation Ratios: 13 % (ref 10.4–31.8)
TIBC: 363 ug/dL (ref 250–450)
UIBC: 315 ug/dL

## 2017-11-22 LAB — FERRITIN: Ferritin: 206 ng/mL (ref 11–307)

## 2017-11-23 LAB — VITAMIN D 25 HYDROXY (VIT D DEFICIENCY, FRACTURES): VIT D 25 HYDROXY: 36.3 ng/mL (ref 30.0–100.0)

## 2018-02-10 ENCOUNTER — Encounter: Payer: Self-pay | Admitting: "Endocrinology

## 2018-02-14 ENCOUNTER — Inpatient Hospital Stay (HOSPITAL_COMMUNITY): Payer: Managed Care, Other (non HMO) | Attending: Hematology

## 2018-02-14 ENCOUNTER — Other Ambulatory Visit (HOSPITAL_COMMUNITY)
Admission: RE | Admit: 2018-02-14 | Discharge: 2018-02-14 | Disposition: A | Discharge status: Home / Self Care | Payer: Managed Care, Other (non HMO) | Source: Ambulatory Visit | Attending: "Endocrinology | Admitting: "Endocrinology

## 2018-02-14 DIAGNOSIS — E042 Nontoxic multinodular goiter: Secondary | ICD-10-CM | POA: Insufficient documentation

## 2018-02-14 DIAGNOSIS — D500 Iron deficiency anemia secondary to blood loss (chronic): Secondary | ICD-10-CM | POA: Insufficient documentation

## 2018-02-14 DIAGNOSIS — D72829 Elevated white blood cell count, unspecified: Secondary | ICD-10-CM | POA: Diagnosis not present

## 2018-02-14 DIAGNOSIS — K900 Celiac disease: Secondary | ICD-10-CM | POA: Diagnosis not present

## 2018-02-14 DIAGNOSIS — D509 Iron deficiency anemia, unspecified: Secondary | ICD-10-CM

## 2018-02-14 DIAGNOSIS — R109 Unspecified abdominal pain: Secondary | ICD-10-CM | POA: Insufficient documentation

## 2018-02-14 DIAGNOSIS — Z79899 Other long term (current) drug therapy: Secondary | ICD-10-CM | POA: Insufficient documentation

## 2018-02-14 DIAGNOSIS — K219 Gastro-esophageal reflux disease without esophagitis: Secondary | ICD-10-CM | POA: Diagnosis not present

## 2018-02-14 DIAGNOSIS — K589 Irritable bowel syndrome without diarrhea: Secondary | ICD-10-CM | POA: Diagnosis not present

## 2018-02-14 DIAGNOSIS — F419 Anxiety disorder, unspecified: Secondary | ICD-10-CM | POA: Insufficient documentation

## 2018-02-14 DIAGNOSIS — R5383 Other fatigue: Secondary | ICD-10-CM | POA: Diagnosis not present

## 2018-02-14 DIAGNOSIS — D270 Benign neoplasm of right ovary: Secondary | ICD-10-CM | POA: Insufficient documentation

## 2018-02-14 DIAGNOSIS — E063 Autoimmune thyroiditis: Secondary | ICD-10-CM | POA: Diagnosis not present

## 2018-02-14 DIAGNOSIS — R110 Nausea: Secondary | ICD-10-CM | POA: Insufficient documentation

## 2018-02-14 DIAGNOSIS — E559 Vitamin D deficiency, unspecified: Secondary | ICD-10-CM

## 2018-02-14 DIAGNOSIS — R197 Diarrhea, unspecified: Secondary | ICD-10-CM

## 2018-02-14 LAB — COMPREHENSIVE METABOLIC PANEL
ALT: 41 U/L (ref 0–44)
AST: 29 U/L (ref 15–41)
Albumin: 4.1 g/dL (ref 3.5–5.0)
Alkaline Phosphatase: 79 U/L (ref 38–126)
Anion gap: 8 (ref 5–15)
BUN: 10 mg/dL (ref 6–20)
CHLORIDE: 106 mmol/L (ref 98–111)
CO2: 23 mmol/L (ref 22–32)
Calcium: 8.8 mg/dL — ABNORMAL LOW (ref 8.9–10.3)
Creatinine, Ser: 0.67 mg/dL (ref 0.44–1.00)
GFR calc non Af Amer: >60 mL/min (ref >60)
Glucose, Bld: 107 mg/dL — ABNORMAL HIGH (ref 70–99)
POTASSIUM: 3.7 mmol/L (ref 3.5–5.1)
SODIUM: 137 mmol/L (ref 135–145)
Total Bilirubin: 0.4 mg/dL (ref 0.3–1.2)
Total Protein: 7.6 g/dL (ref 6.5–8.1)

## 2018-02-14 LAB — CBC WITH DIFFERENTIAL/PLATELET
Basophils Absolute: 0 10*3/uL (ref 0.0–0.1)
Basophils Relative: 0 %
EOS ABS: 0.5 10*3/uL (ref 0.0–0.7)
EOS PCT: 5 %
HCT: 37.9 % (ref 36.0–46.0)
Hemoglobin: 12.4 g/dL (ref 12.0–15.0)
LYMPHS ABS: 1.8 10*3/uL (ref 0.7–4.0)
Lymphocytes Relative: 19 %
MCH: 29.3 pg (ref 26.0–34.0)
MCHC: 32.7 g/dL (ref 30.0–36.0)
MCV: 89.6 fL (ref 78.0–100.0)
MONO ABS: 0.4 10*3/uL (ref 0.1–1.0)
MONOS PCT: 4 %
Neutro Abs: 6.9 10*3/uL (ref 1.7–7.7)
Neutrophils Relative %: 72 %
PLATELETS: 427 10*3/uL — AB (ref 150–400)
RBC: 4.23 MIL/uL (ref 3.87–5.11)
RDW: 14.6 % (ref 11.5–15.5)
WBC: 9.6 10*3/uL (ref 4.0–10.5)

## 2018-02-14 LAB — T4, FREE: Free T4: 0.63 ng/dL — ABNORMAL LOW (ref 0.82–1.77)

## 2018-02-14 LAB — IRON AND TIBC
IRON: 38 ug/dL (ref 28–170)
SATURATION RATIOS: 11 % (ref 10.4–31.8)
TIBC: 344 ug/dL (ref 250–450)
UIBC: 306 ug/dL

## 2018-02-14 LAB — FERRITIN: FERRITIN: 172 ng/mL (ref 11–307)

## 2018-02-14 LAB — TSH: TSH: 1.725 u[IU]/mL (ref 0.350–4.500)

## 2018-02-15 LAB — T3, FREE: T3 FREE: 3.4 pg/mL (ref 2.0–4.4)

## 2018-02-15 LAB — VITAMIN D 25 HYDROXY (VIT D DEFICIENCY, FRACTURES): Vit D, 25-Hydroxy: 22.2 ng/mL — ABNORMAL LOW (ref 30.0–100.0)

## 2018-02-17 LAB — SEROTONIN SERUM: SEROTONIN, SERUM: 11 ng/mL (ref 0–420)

## 2018-02-17 LAB — CHROMOGRANIN A

## 2018-02-21 ENCOUNTER — Encounter (HOSPITAL_COMMUNITY): Payer: Self-pay | Admitting: Internal Medicine

## 2018-02-21 ENCOUNTER — Inpatient Hospital Stay (HOSPITAL_BASED_OUTPATIENT_CLINIC_OR_DEPARTMENT_OTHER): Payer: Managed Care, Other (non HMO) | Admitting: Internal Medicine

## 2018-02-21 VITALS — BP 119/97 | HR 78 | Temp 98.4°F | Resp 16 | Wt 266.9 lb

## 2018-02-21 DIAGNOSIS — K219 Gastro-esophageal reflux disease without esophagitis: Secondary | ICD-10-CM

## 2018-02-21 DIAGNOSIS — R109 Unspecified abdominal pain: Secondary | ICD-10-CM | POA: Diagnosis not present

## 2018-02-21 DIAGNOSIS — K5900 Constipation, unspecified: Secondary | ICD-10-CM | POA: Diagnosis not present

## 2018-02-21 DIAGNOSIS — E063 Autoimmune thyroiditis: Secondary | ICD-10-CM

## 2018-02-21 DIAGNOSIS — D270 Benign neoplasm of right ovary: Secondary | ICD-10-CM

## 2018-02-21 DIAGNOSIS — R110 Nausea: Secondary | ICD-10-CM | POA: Diagnosis not present

## 2018-02-21 DIAGNOSIS — Z79899 Other long term (current) drug therapy: Secondary | ICD-10-CM

## 2018-02-21 DIAGNOSIS — D500 Iron deficiency anemia secondary to blood loss (chronic): Secondary | ICD-10-CM

## 2018-02-21 DIAGNOSIS — D72829 Elevated white blood cell count, unspecified: Secondary | ICD-10-CM

## 2018-02-21 DIAGNOSIS — F419 Anxiety disorder, unspecified: Secondary | ICD-10-CM

## 2018-02-21 DIAGNOSIS — E042 Nontoxic multinodular goiter: Secondary | ICD-10-CM

## 2018-02-21 DIAGNOSIS — R5383 Other fatigue: Secondary | ICD-10-CM

## 2018-02-21 DIAGNOSIS — K589 Irritable bowel syndrome without diarrhea: Secondary | ICD-10-CM

## 2018-02-21 NOTE — Patient Instructions (Signed)
[place] at [facility] Discharge Instructions  You saw Dr. Walden Field today.   Thank you for choosing [place] at [facility] to provide your oncology and hematology care.  To afford each patient quality time with our provider, please arrive at least 15 minutes before your scheduled appointment time.   If you have a lab appointment with the [facility] please come in thru the  Main Entrance and check in at the main information desk  You need to re-schedule your appointment should you arrive 10 or more minutes late.  We strive to give you quality time with our providers, and arriving late affects you and other patients whose appointments are after yours.  Also, if you no show three or more times for appointments you may be dismissed from the clinic at the providers discretion.     Again, thank you for choosing [facility].  Our hope is that these requests will decrease the amount of time that you wait before being seen by our physicians.       _____________________________________________________________  Should you have questions after your visit to [facility], please contact our office at [phone] between the hours of 8:00 a.m. and 4:30 p.m.  Voicemails left after 4:00 p.m. will not be returned until the following business day.  For prescription refill requests, have your pharmacy contact our office and allow 72 hours.    Cancer Center Support Programs:   > Cancer Support Group  2nd Tuesday of the month 1pm-2pm, Journey Room

## 2018-02-21 NOTE — Progress Notes (Signed)
Diagnosis Iron deficiency anemia due to chronic blood loss - Plan: CBC with Differential/Platelet, Comprehensive metabolic panel, Lactate dehydrogenase, Ferritin  Staging Cancer Staging No matching staging information was found for the patient.  Assessment and Plan:  1.  Iron deficiency anemia.  Pt was diagnosed with celiac sprue by Dr. Stephenie Acres at cancer center several years ago based on IgG Gliadin Antibody test that was positive.  TTG IgA was negative.    She was last treated with IV iron in 02/2017.    Labs done 02/14/2018 reviewed and showed WBC 9.6 HB 12.4 plts 427,000, Ferritin 172.  HB and ferritin are adequate.  She will RTC in 08/2018 for follow-up and repeat labs.    2  Abdominal pain/nausea.  She reports this has been going on for some time.  She will be referred to Dr. Laural Golden for evaluation for EGD.  She was given option of imaging but desires to wait until her GI evaluation.  She had previous colonoscopy  done 02/2012 that was negative.  NP Renato Battles did CHR A testing that was negative at < 1.    3.  Leukocytosis. Labs done 02/14/2018 reviewed and showed normal WBC 9.6.  She had Jak 2 and BCR/ABL testing done in 2017 that was negative.    4.  Cystadenoma of right ovary.  Follow-up with GYN as recommended.    5.  Fatigue.  TSH don 02/14/2018 was WNL at 1.725.  Follow-up with PCP if ongoing symptoms.  Pt has Hashimoto's and should follow-up with PCP or Endocrine as recommended.    30 minutes spent with more than 50% spent in counseling and coordination of care.     Interval History:  From Dr. Donald Pore last note on 07/13/16)    Current Status:  Pt is seen today for follow-up.  She is here to go over labs.  She reports nausea and abdominal pain.    Problem List Patient Active Problem List   Diagnosis Date Noted  . Hashimoto's thyroiditis [E06.3] 09/02/2017  . Multinodular goiter [E04.2] 11/02/2016  . Class 2 obesity due to excess calories without serious comorbidity with body mass  index (BMI) of 39.0 to 39.9 in adult [E66.09, Z68.39] 11/02/2016  . Leukocytosis [D72.829] 07/13/2016  . Esophageal reflux [K21.9] 02/23/2016  . Encounter for other specified aftercare [Z51.89] 12/21/2015  . Migraine with aura [G43.109] 12/21/2015  . Vitamin D deficiency [E55.9] 05/13/2015  . Thoracic or lumbosacral neuritis or radiculitis [IMO0002] 11/22/2014  . Iron deficiency anemia [D50.9] 08/20/2014  . Anxiety state [F41.1] 08/20/2014  . Ectopic pregnancy, 03/26/2014 [O00.90] 05/13/2014  . Celiac disease [K90.0] 07/10/2013  . Cystadenoma of right ovary [D27.0] 07/10/2013  . Hair loss [L65.9] 06/30/2013  . IBS (irritable bowel syndrome) [K58.9] 04/03/2012  . Seasonal allergies [J30.2] 01/31/2012  . Anemia [D64.9] 01/31/2012    Past Medical History Past Medical History:  Diagnosis Date  . Anxiety   . Environmental allergies   . GERD (gastroesophageal reflux disease)   . IBS (irritable bowel syndrome)   . Tubal ectopic pregnancy     Past Surgical History Past Surgical History:  Procedure Laterality Date  . CHOLECYSTECTOMY  Sept of 2012 gallstones  . COLONOSCOPY  02/27/2012   Procedure: COLONOSCOPY;  Surgeon: Rogene Houston, MD;  [facility];  Service: Endoscopy;  Laterality: N/A;  225  . rt ovary      removed 03/2010 for 4 pound tumor  . TUBAL LIGATION Bilateral 04/2013    Family History Family History  Problem  Relation Age of Onset  . Epilepsy Mother   . Epilepsy Brother   . Lupus Brother      Social History  reports that she has never smoked. She has never used smokeless tobacco. She reports that she does not drink alcohol or use drugs.  Medications  Current Outpatient Medications:  .  ergocalciferol (VITAMIN D2) 50000 units capsule, Take 1 capsule (50,000 Units total) by mouth once a week., Disp: 12 capsule, Rfl: 1 .  fexofenadine (ALLEGRA) 180 MG tablet, Take 180 mg by mouth daily., Disp: , Rfl:  .  FLUoxetine (PROZAC) 20 MG capsule, Take 60 mg  by mouth daily., Disp: , Rfl:  .  gabapentin (NEURONTIN) 100 MG capsule, Take 100 mg by mouth daily., Disp: , Rfl:  .  lamoTRIgine (LAMICTAL) 100 MG tablet, Take 100 mg by mouth daily., Disp: , Rfl:   Allergies Penicillins; Casein; and Augmentin [amoxicillin-pot clavulanate]  Review of Systems Review of Systems - Oncology ROS negative other than fatigue   Physical Exam  Vitals Wt Readings from Last 3 Encounters:  02/21/18 266 lb 14.4 oz (121.1 kg)  09/02/17 269 lb (122 kg)  08/23/17 267 lb 8 oz (121.3 kg)   Temp Readings from Last 3 Encounters:  02/21/18 98.4 F (36.9 C) (Oral)  08/23/17 98.4 F (36.9 C) (Oral)  03/01/17 98.2 F (36.8 C) (Oral)   BP Readings from Last 3 Encounters:  02/21/18 (!) 119/97  09/02/17 124/83  08/23/17 124/82   Pulse Readings from Last 3 Encounters:  02/21/18 78  09/02/17 92  08/23/17 (!) 105   Constitutional: Well-developed, well-nourished, and in no distress.   HENT: Head: Normocephalic and atraumatic.  Mouth/Throat: No oropharyngeal exudate. Mucosa moist. Eyes: Pupils are equal, round, and reactive to light. Conjunctivae are normal. No scleral icterus.  Neck: Normal range of motion. Neck supple. No JVD present.  Cardiovascular: Normal rate, regular rhythm and normal heart sounds.  Exam reveals no gallop and no friction rub.   No murmur heard. Pulmonary/Chest: Effort normal and breath sounds normal. No respiratory distress. No wheezes.No rales.  Abdominal: Soft. Bowel sounds are normal. No distension. Tender in epigastric region.  No guarding.   Musculoskeletal: No edema or tenderness.  Lymphadenopathy: No cervical, axillary or supraclavicular adenopathy.  Neurological: Alert and oriented to person, place, and time. No cranial nerve deficit.  Skin: Skin is warm and dry. No rash noted. No erythema. No pallor.  Psychiatric: Affect and judgment normal.   Labs No visits with results within 3 Day(s) from this visit.  Latest known visit  with results is:  Hospital Outpatient Visit on 02/14/2018  Component Date Value Ref Range Status  . Free T4 02/14/2018 0.63* 0.82 - 1.77 ng/dL Final   Comment: (NOTE) Biotin ingestion may interfere with free T4 tests. If the results are inconsistent with the TSH level, previous test results, or the clinical presentation, then consider biotin interference. If needed, order repeat testing after stopping biotin. Performed at [facility], [place] [address] [place], [place] [address]   . T3, Free 02/14/2018 3.4  2.0 - 4.4 pg/mL Final   Comment: (NOTE) Performed At: [facility] [place], [place] [id number] Rush Farmer MD OB:[id number]   . TSH 02/14/2018 1.725  0.350 - 4.500 uIU/mL Final   Comment: Performed by a 3rd Generation assay with a functional sensitivity of <=0.01 uIU/mL. Performed at [facility], [address], [place], [place] [address]      Pathology Orders Placed This Encounter  Procedures  . CBC with Differential/Platelet    Standing Status:   Future    Standing Expiration Date:   02/22/2020  . Comprehensive metabolic panel    Standing Status:   Future    Standing Expiration Date:   02/22/2020  . Lactate dehydrogenase    Standing Status:   Future    Standing Expiration Date:   02/22/2020  . Ferritin    Standing Status:   Future    Standing Expiration Date:   02/22/2020       Zoila Shutter MD

## 2018-03-03 ENCOUNTER — Ambulatory Visit: Payer: Managed Care, Other (non HMO) | Admitting: "Endocrinology

## 2018-03-04 ENCOUNTER — Ambulatory Visit (INDEPENDENT_AMBULATORY_CARE_PROVIDER_SITE_OTHER): Payer: Managed Care, Other (non HMO) | Admitting: "Endocrinology

## 2018-03-04 ENCOUNTER — Encounter: Payer: Self-pay | Admitting: "Endocrinology

## 2018-03-04 VITALS — BP 135/80 | HR 83 | Ht 68.0 in | Wt 265.0 lb

## 2018-03-04 DIAGNOSIS — E559 Vitamin D deficiency, unspecified: Secondary | ICD-10-CM

## 2018-03-04 DIAGNOSIS — E063 Autoimmune thyroiditis: Secondary | ICD-10-CM | POA: Diagnosis not present

## 2018-03-04 DIAGNOSIS — E038 Other specified hypothyroidism: Secondary | ICD-10-CM

## 2018-03-04 MED ORDER — VITAMIN D3 125 MCG (5000 UT) PO CAPS: 5000.0000 [IU] | ORAL_CAPSULE | Freq: Every day | ORAL | 0 refills | Status: DC

## 2018-03-04 MED ORDER — LEVOTHYROXINE SODIUM 50 MCG PO TABS: 50.0000 ug | ORAL_TABLET | Freq: Every day | ORAL | 3 refills | Status: DC

## 2018-03-04 NOTE — Progress Notes (Signed)
Endocrinology follow-up note   Subjective:    Patient ID: Maureen Yates, female    DOB: [birth year], PCP Burdine, Virgina Evener, MD   Past Medical History:  Diagnosis Date  . Anxiety   . Environmental allergies   . GERD (gastroesophageal reflux disease)   . IBS (irritable bowel syndrome)   . Tubal ectopic pregnancy    Past Surgical History:  Procedure Laterality Date  . CHOLECYSTECTOMY  Sept of 2012 gallstones  . COLONOSCOPY  02/27/2012   Procedure: COLONOSCOPY;  Surgeon: Rogene Houston, MD;  [facility];  Service: Endoscopy;  Laterality: N/A;  225  . rt ovary      removed 03/2010 for 4 pound tumor  . TUBAL LIGATION Bilateral 04/2013   Social History   Socioeconomic History  . Marital status: Married    Spouse name: Not on file  . Number of children: Not on file  . Years of education: Not on file  . Highest education level: Not on file  Occupational History  . Not on file  Social Needs  . Financial resource strain: Not on file  . Food insecurity:    Worry: Not on file    Inability: Not on file  . Transportation needs:    Medical: Not on file    Non-medical: Not on file  Tobacco Use  . Smoking status: Never Smoker  . Smokeless tobacco: Never Used  Substance and Sexual Activity  . Alcohol use: No  . Drug use: No  . Sexual activity: Yes  Lifestyle  . Physical activity:    Days per week: Not on file    Minutes per session: Not on file  . Stress: Not on file  Relationships  . Social connections:    Talks on phone: Not on file    Gets together: Not on file    Attends religious service: Not on file    Active member of club or organization: Not on file    Attends meetings of clubs or organizations: Not on file    Relationship status: Not on file  Other Topics Concern  . Not on file  Social History Narrative  . Not on file   Outpatient Encounter Medications as of 03/04/2018  Medication Sig  . ergocalciferol (VITAMIN D2) 50000 units capsule Take 1  capsule (50,000 Units total) by mouth once a week.  . fexofenadine (ALLEGRA) 180 MG tablet Take 180 mg by mouth daily.  Marland Kitchen FLUoxetine (PROZAC) 20 MG capsule Take 60 mg by mouth daily.  Marland Kitchen lamoTRIgine (LAMICTAL) 100 MG tablet Take 100 mg by mouth daily.  . Cholecalciferol (VITAMIN D3) 5000 units CAPS Take 1 capsule (5,000 Units total) by mouth daily.  Marland Kitchen levothyroxine (SYNTHROID, LEVOTHROID) 50 MCG tablet Take 1 tablet (50 mcg total) by mouth daily before breakfast.  . [DISCONTINUED] gabapentin (NEURONTIN) 100 MG capsule Take 100 mg by mouth daily.   No facility-administered encounter medications on file as of 03/04/2018.    ALLERGIES: Allergies  Allergen Reactions  . Penicillins Rash  . Casein Other (See Comments)    G.I. Upset  . Augmentin [Amoxicillin-Pot Clavulanate] Swelling and Rash    VACCINATION STATUS:  There is no immunization history on file for this patient.  HPI  37 year old female patient with medical history as above. She is being seen in Follow-up for  multinodular goiter. -She has lab evidence of  celiac disease not on treatment.  She is on treatment for anemia. -  She has family history of Graves'  disease in one of her grandparents. -She was found to have multiple punctate partially cystic nodules on bilateral thyroid lobes in March 2018.  Her repeat thyroid ultrasound from 03/13/2017 showed right lobe 5 cm with 0.6 cm nodule stable from prior study, left lobe stable at 4.8 cm with 0.5 cm stable nodule on the lower portion. -  She has previous documentation of  positive for thyroid peroxidase antibodies-indicating Hashimoto's thyroiditis. -Her previsit thyroid function tests indicate low free T4. -Patient has multiple nonspecific symptoms including progressive weight gain,  fatigue. -She denies heat intolerance nor cold intolerance. She denies any dysphagia, shortness of breath, nor voice change. -She is on antianxiety medications including Prozac and Lamictal. -She   does not have any new complaints today.  Review of Systems   Constitutional: + Steady weight gain , + fatigue, no subjective hyperthermia, no subjective hypothermia Eyes: no blurry vision, no xerophthalmia ENT: no sore throat, no nodules palpated in throat, no dysphagia/odynophagia, no hoarseness Cardiovascular: no Chest Pain, no Shortness of Breath, no palpitations, no leg swelling Respiratory: no cough, no SOB Gastrointestinal: no Nausea/Vomiting/Diarhhea Musculoskeletal: no muscle/joint aches Skin: no rashes Neurological: - tremors, no numbness, no tingling, no dizziness Psychiatric: + depression, + anxiety  Objective:    BP 135/80   Pulse 83   Ht 5' 8"  (1.727 m)   Wt 265 lb (120.2 kg)   BMI 40.29 kg/m   Wt Readings from Last 3 Encounters:  03/04/18 265 lb (120.2 kg)  02/21/18 266 lb 14.4 oz (121.1 kg)  09/02/17 269 lb (122 kg)    Physical Exam  Constitutional:  + Obese, not in acute distress, normal state of mind Eyes: PERRLA, EOMI, no exophthalmos ENT: moist mucous membranes, + palpable thyroid , no cervical lymphadenopathy  Musculoskeletal: no gross deformities, strength intact in all four extremities Skin: moist, warm, no rashes Neurological: + tremor with outstretched hands, Deep tendon reflexes normal in all four extremities.   CMP     Component Value Date/Time   NA 137 02/14/2018 0939   K 3.7 02/14/2018 0939   CL 106 02/14/2018 0939   CO2 23 02/14/2018 0939   GLUCOSE 107 (H) 02/14/2018 0939   BUN 10 02/14/2018 0939   CREATININE 0.67 02/14/2018 0939   CALCIUM 8.8 (L) 02/14/2018 0939   PROT 7.6 02/14/2018 0939   ALBUMIN 4.1 02/14/2018 0939   AST 29 02/14/2018 0939   ALT 41 02/14/2018 0939   ALKPHOS 79 02/14/2018 0939   BILITOT 0.4 02/14/2018 0939   GFRNONAA >60 02/14/2018 0939   GFRAA >60 02/14/2018 0939   Results for ENEDELIA, MARTORELLI (MRN [id number]) as of 03/04/2018 13:11  Ref. Range 02/14/2018 09:45  TSH Latest Ref Range: 0.350 - 4.500 uIU/mL  1.725  Triiodothyronine,Free,Serum Latest Ref Range: 2.0 - 4.4 pg/mL 3.4  T4,Free(Direct) Latest Ref Range: 0.82 - 1.77 ng/dL 0.63 (L)     October 17, 2016 lab work showed TPO antibodies normal at 14, thyroglobulin antibodies <1, free T4 1.6, TSH 1.83, free T3 3.0  Thyroid sonograms from 09/24/2016 showed right lobe 4.4 cm left lobe 4.8 cm. There are multiple punctate/partially cystic nodules, largest 0.6 cm on the left lobe, and 0.4 cm on the right lobe.  Thyroid ultrasound from 03/13/2017, right lobe 5 cm with 0.6 cm nodule, left lobe 4.8 cm with 0.5 cm stable nodule.  Assessment & Plan:   1.  Hypothyroidism 2.  Multinodular  Goiter 3.  Vitamin D deficiency -Her repeat thyroid function tests are consistent with hypothyroidism.  She will benefit from early initiation of thyroid hormone replacement.  I discussed and initiated levothyroxine 50 mcg p.o. every morning.   - We discussed about correct intake of levothyroxine, at fasting, with water, separated by at least 30 minutes from breakfast, and separated by more than 4 hours from calcium, iron, multivitamins, acid reflux medications (PPIs). -Patient is made aware of the fact that thyroid hormone replacement is needed for life, dose to be adjusted by periodic monitoring of thyroid function tests.   -  she does have euthyroid multinodular goiter- which has been stable over more than 12 months.  - She will not require immediate intervention at this time, she does not require fine-needle aspiration of the small nodules at this time. - She will  have repeat surveillance thyroid ultrasound before her next visit.    -Her vitamin D remains low at 22 despite 50,000 units of ergocalciferol weekly.  I discussed and added daily cholecalciferol 5000 units for the next 90 days.  - I advised patient to maintain close follow up with Burdine, Virgina Evener, MD for primary care needs. Follow up plan: Return in about 3 months (around 06/04/2018) for Follow up  with Pre-visit Labs, Thyroid / Neck Ultrasound.  Glade Lloyd, MD Phone: [phone]  Fax: [phone]  This note was partially dictated with voice recognition software. Similar sounding words can be transcribed inadequately or may not  be corrected upon review.  03/04/2018, 1:08 PM

## 2018-03-11 ENCOUNTER — Other Ambulatory Visit: Payer: Self-pay

## 2018-03-11 ENCOUNTER — Encounter (HOSPITAL_COMMUNITY): Payer: Self-pay

## 2018-03-11 ENCOUNTER — Other Ambulatory Visit (HOSPITAL_COMMUNITY): Payer: Self-pay | Admitting: *Deleted

## 2018-03-11 DIAGNOSIS — E559 Vitamin D deficiency, unspecified: Secondary | ICD-10-CM

## 2018-03-11 MED ORDER — ERGOCALCIFEROL 1.25 MG (50000 UT) PO CAPS: 50000.0000 [IU] | ORAL_CAPSULE | ORAL | 1 refills | Status: DC

## 2018-03-13 ENCOUNTER — Ambulatory Visit (INDEPENDENT_AMBULATORY_CARE_PROVIDER_SITE_OTHER): Payer: Managed Care, Other (non HMO) | Admitting: Internal Medicine

## 2018-03-18 ENCOUNTER — Encounter (INDEPENDENT_AMBULATORY_CARE_PROVIDER_SITE_OTHER): Payer: Self-pay | Admitting: Internal Medicine

## 2018-03-18 ENCOUNTER — Ambulatory Visit (INDEPENDENT_AMBULATORY_CARE_PROVIDER_SITE_OTHER): Payer: Managed Care, Other (non HMO) | Admitting: Internal Medicine

## 2018-03-18 ENCOUNTER — Encounter (INDEPENDENT_AMBULATORY_CARE_PROVIDER_SITE_OTHER): Payer: Self-pay | Admitting: *Deleted

## 2018-03-18 VITALS — BP 160/80 | HR 80 | Temp 98.2°F | Ht 67.5 in | Wt 265.4 lb

## 2018-03-18 DIAGNOSIS — R110 Nausea: Secondary | ICD-10-CM

## 2018-03-18 DIAGNOSIS — R197 Diarrhea, unspecified: Secondary | ICD-10-CM

## 2018-03-18 DIAGNOSIS — R1013 Epigastric pain: Secondary | ICD-10-CM | POA: Diagnosis not present

## 2018-03-18 MED ORDER — OMEPRAZOLE 40 MG PO CPDR: 40.0000 mg | DELAYED_RELEASE_CAPSULE | Freq: Every day | ORAL | 3 refills | Status: DC

## 2018-03-18 MED ORDER — DICYCLOMINE HCL 10 MG PO CAPS: 10.0000 mg | ORAL_CAPSULE | Freq: Every day | ORAL | 3 refills | Status: DC

## 2018-03-18 NOTE — Progress Notes (Signed)
Subjective:    Patient ID: Maureen Yates, female    DOB: 11/04/1980, 37 y.o.   MRN: [id number]  HPI Referred by Dr. Walden Field for EGD.  Patient tells me she has been having to have iron transfusion since 2013. Last transfusion was almost a year ago.  Stool samples in the past were negative.  She tells me her father had Crohns She tells me she has 4-8 BMs a day.  She has urgency. Has tried Bentyl in the past but it dried her out to much.  Has tried Imodium but it does not help.  She also has GERD but is not taking anything for this.  She has frequent nausea. Not related to eating.  She says she has tenderness in her epigastric region.  She says she was diagnosed with Celiac disease by Dr. Barnet Glasgow.  Is not on a Celiac diet at this time.  LMP 2 days. Has a period every other month and her periods are light. Appetite is okay.   CBC    Component Value Date/Time   WBC 9.6 02/14/2018 0939   RBC 4.23 02/14/2018 0939   HGB 12.4 02/14/2018 0939   HCT 37.9 02/14/2018 0939   PLT 427 (H) 02/14/2018 0939   MCV 89.6 02/14/2018 0939   MCH 29.3 02/14/2018 0939   MCHC 32.7 02/14/2018 0939   RDW 14.6 02/14/2018 0939   LYMPHSABS 1.8 02/14/2018 0939   MONOABS 0.4 02/14/2018 0939   EOSABS 0.5 02/14/2018 0939   BASOSABS 0.0 02/14/2018 0939   02/14/2018 Ferritin 172, Iron 38, TIBC 344, Saturation 11, UIBC 306     07/08/2013 Tissue transglutaminase IgA 2.9 (negative.  Reticulin Ab, IgA negative. Titer not indicated.     07/08/2013.  Gliadin Igg 77.7 (elevated) Gliadin IgA 3.5 Tissue transglutaminase Ab, IgA 2.9 (normal)   02/27/2012 Colonoscopy: chronic diarrhea and rectal bleeding.  Findings:   Prep excellent. Normal terminal ileum. Nirmal mucosa of colon throughout. Normal rectal mucosa. Small hemorrhoids below the dentate line. Biopsy: Benign colonic mucosa. No microscopic colitis. Active inflammation or granulomas.   CBC    Component Value Date/Time   WBC 9.6 02/14/2018 0939   RBC 4.23 02/14/2018 0939   HGB 12.4 02/14/2018 0939   HCT 37.9 02/14/2018 0939   PLT 427 (H) 02/14/2018 0939   MCV 89.6 02/14/2018 0939   MCH 29.3 02/14/2018 0939   MCHC 32.7 02/14/2018 0939   RDW 14.6 02/14/2018 0939   LYMPHSABS 1.8 02/14/2018 0939   MONOABS 0.4 02/14/2018 0939   EOSABS 0.5 02/14/2018 0939   BASOSABS 0.0 02/14/2018 0939       Review of Systems Past Medical History:  Diagnosis Date  . Anxiety   . Environmental allergies   . GERD (gastroesophageal reflux disease)   . IBS (irritable bowel syndrome)   . Tubal ectopic pregnancy     Past Surgical History:  Procedure Laterality Date  . CHOLECYSTECTOMY  Sept of 2012 gallstones  . COLONOSCOPY  02/27/2012   Procedure: COLONOSCOPY;  Surgeon: Rogene Houston, MD;  [facility];  Service: Endoscopy;  Laterality: N/A;  225  . rt ovary      removed 03/2010 for 4 pound tumor  . TUBAL LIGATION Bilateral 04/2013    Allergies  Allergen Reactions  . Penicillins Rash  . Casein Other (See Comments)    G.I. Upset  . Augmentin [Amoxicillin-Pot Clavulanate] Swelling and Rash    Current Outpatient Medications on File Prior to Visit  Medication Sig Dispense Refill  .  ergocalciferol (VITAMIN D2) 50000 units capsule Take 1 capsule (50,000 Units total) by mouth once a week. 12 capsule 1  . fexofenadine (ALLEGRA) 180 MG tablet Take 180 mg by mouth as needed.     Marland Kitchen FLUoxetine (PROZAC) 20 MG capsule Take 60 mg by mouth daily.    Marland Kitchen lamoTRIgine (LAMICTAL) 100 MG tablet Take 100 mg by mouth daily.    Marland Kitchen levothyroxine (SYNTHROID, LEVOTHROID) 50 MCG tablet Take 1 tablet (50 mcg total) by mouth daily before breakfast. 30 tablet 3   No current facility-administered medications on file prior to visit.         Objective:   Physical Exam Blood pressure (!) 160/80, pulse 80, temperature 98.2 F (36.8 C), height 5' 7.5" (1.715 m), weight 265 lb 6.4 oz (120.4 kg). Alert and oriented. Skin warm and dry. Oral mucosa is moist.    . Sclera anicteric, conjunctivae is pink. Thyroid not enlarged. No cervical lymphadenopathy. Lungs clear. Heart regular rate and rhythm.  Abdomen is soft. Bowel sounds are positive. No hepatomegaly. No abdominal masses felt. Slight epigastric tenderness.  No edema to lower extremities.           Assessment & Plan:  Epigastric pain. Nausea. GERD needs to be ruled out.  EGD.  Rx for Dicyclomine and Omeprazole sent to her pharmacy.

## 2018-03-18 NOTE — Patient Instructions (Signed)
Rx for Omeprazole sent to her pharmacy. Rx for Dicyclomine sent to her pharmacy.

## 2018-03-19 DIAGNOSIS — R197 Diarrhea, unspecified: Secondary | ICD-10-CM | POA: Insufficient documentation

## 2018-03-19 DIAGNOSIS — R1013 Epigastric pain: Secondary | ICD-10-CM | POA: Insufficient documentation

## 2018-04-07 ENCOUNTER — Telehealth (INDEPENDENT_AMBULATORY_CARE_PROVIDER_SITE_OTHER): Payer: Self-pay | Admitting: *Deleted

## 2018-04-07 NOTE — Telephone Encounter (Signed)
Patient called in -- wants to cancel EGD for 05/21/18 -- didn't give reason

## 2018-04-07 NOTE — Telephone Encounter (Signed)
noted 

## 2018-04-10 ENCOUNTER — Ambulatory Visit (HOSPITAL_COMMUNITY)
Admission: RE | Admit: 2018-04-10 | Payer: Managed Care, Other (non HMO) | Source: Ambulatory Visit | Admitting: Internal Medicine

## 2018-04-10 ENCOUNTER — Encounter (HOSPITAL_COMMUNITY): Admission: RE | Payer: Self-pay | Source: Ambulatory Visit

## 2018-04-10 SURGERY — EGD (ESOPHAGOGASTRODUODENOSCOPY)
Anesthesia: Moderate Sedation

## 2018-05-13 ENCOUNTER — Ambulatory Visit (HOSPITAL_COMMUNITY)
Admission: RE | Admit: 2018-05-13 | Discharge: 2018-05-13 | Disposition: A | Discharge status: Home / Self Care | Payer: Managed Care, Other (non HMO) | Source: Ambulatory Visit | Attending: "Endocrinology | Admitting: "Endocrinology

## 2018-05-13 DIAGNOSIS — E042 Nontoxic multinodular goiter: Secondary | ICD-10-CM | POA: Diagnosis not present

## 2018-05-13 DIAGNOSIS — E063 Autoimmune thyroiditis: Secondary | ICD-10-CM | POA: Diagnosis not present

## 2018-05-13 DIAGNOSIS — E038 Other specified hypothyroidism: Secondary | ICD-10-CM | POA: Diagnosis not present

## 2018-05-14 LAB — T4, FREE: Free T4: 1.2 ng/dL (ref 0.8–1.8)

## 2018-05-14 LAB — VITAMIN D 25 HYDROXY (VIT D DEFICIENCY, FRACTURES): Vit D, 25-Hydroxy: 27 ng/mL — ABNORMAL LOW (ref 30–100)

## 2018-05-14 LAB — TSH: TSH: 1.02 m[IU]/L

## 2018-06-03 ENCOUNTER — Encounter: Payer: Self-pay | Admitting: "Endocrinology

## 2018-06-03 ENCOUNTER — Ambulatory Visit (INDEPENDENT_AMBULATORY_CARE_PROVIDER_SITE_OTHER): Payer: Managed Care, Other (non HMO) | Admitting: "Endocrinology

## 2018-06-03 VITALS — BP 123/85 | HR 80 | Ht 67.5 in | Wt 269.0 lb

## 2018-06-03 DIAGNOSIS — E038 Other specified hypothyroidism: Secondary | ICD-10-CM

## 2018-06-03 DIAGNOSIS — E66813 Obesity, class 3: Secondary | ICD-10-CM

## 2018-06-03 DIAGNOSIS — E063 Autoimmune thyroiditis: Secondary | ICD-10-CM

## 2018-06-03 DIAGNOSIS — E559 Vitamin D deficiency, unspecified: Secondary | ICD-10-CM | POA: Diagnosis not present

## 2018-06-03 DIAGNOSIS — Z6841 Body Mass Index (BMI) 40.0 and over, adult: Secondary | ICD-10-CM

## 2018-06-03 DIAGNOSIS — E042 Nontoxic multinodular goiter: Secondary | ICD-10-CM | POA: Diagnosis not present

## 2018-06-03 MED ORDER — LEVOTHYROXINE SODIUM 75 MCG PO TABS: 75.0000 ug | ORAL_TABLET | Freq: Every day | ORAL | 1 refills | Status: DC

## 2018-06-03 NOTE — Patient Instructions (Signed)

## 2018-06-03 NOTE — Progress Notes (Signed)
Endocrinology follow-up note   Subjective:    Patient ID: Maureen Yates, female    DOB: [birth year], PCP Burdine, Virgina Evener, MD   Past Medical History:  Diagnosis Date  . Anxiety   . Environmental allergies   . GERD (gastroesophageal reflux disease)   . IBS (irritable bowel syndrome)   . Tubal ectopic pregnancy    Past Surgical History:  Procedure Laterality Date  . CHOLECYSTECTOMY  Sept of 2012 gallstones  . COLONOSCOPY  02/27/2012   Procedure: COLONOSCOPY;  Surgeon: Rogene Houston, MD;  [facility];  Service: Endoscopy;  Laterality: N/A;  225  . rt ovary      removed 03/2010 for 4 pound tumor  . TUBAL LIGATION Bilateral 04/2013   Social History   Socioeconomic History  . Marital status: Married    Spouse name: Not on file  . Number of children: Not on file  . Years of education: Not on file  . Highest education level: Not on file  Occupational History  . Not on file  Social Needs  . Financial resource strain: Not on file  . Food insecurity:    Worry: Not on file    Inability: Not on file  . Transportation needs:    Medical: Not on file    Non-medical: Not on file  Tobacco Use  . Smoking status: Never Smoker  . Smokeless tobacco: Never Used  Substance and Sexual Activity  . Alcohol use: No  . Drug use: No  . Sexual activity: Yes  Lifestyle  . Physical activity:    Days per week: Not on file    Minutes per session: Not on file  . Stress: Not on file  Relationships  . Social connections:    Talks on phone: Not on file    Gets together: Not on file    Attends religious service: Not on file    Active member of club or organization: Not on file    Attends meetings of clubs or organizations: Not on file    Relationship status: Not on file  Other Topics Concern  . Not on file  Social History Narrative  . Not on file   Outpatient Encounter Medications as of 06/03/2018  Medication Sig  . dicyclomine (BENTYL) 10 MG capsule Take 1 capsule (10  mg total) by mouth daily.  . ergocalciferol (VITAMIN D2) 50000 units capsule Take 1 capsule (50,000 Units total) by mouth once a week.  . fexofenadine (ALLEGRA) 180 MG tablet Take 180 mg by mouth as needed.   Marland Kitchen FLUoxetine (PROZAC) 20 MG capsule Take 60 mg by mouth daily.  Marland Kitchen lamoTRIgine (LAMICTAL) 100 MG tablet Take 100 mg by mouth daily.  Marland Kitchen levothyroxine (SYNTHROID, LEVOTHROID) 75 MCG tablet Take 1 tablet (75 mcg total) by mouth daily before breakfast.  . omeprazole (PRILOSEC) 40 MG capsule Take 1 capsule (40 mg total) by mouth daily.  . [DISCONTINUED] levothyroxine (SYNTHROID, LEVOTHROID) 50 MCG tablet Take 1 tablet (50 mcg total) by mouth daily before breakfast.   No facility-administered encounter medications on file as of 06/03/2018.    ALLERGIES: Allergies  Allergen Reactions  . Penicillins Rash  . Casein Other (See Comments)    G.I. Upset  . Augmentin [Amoxicillin-Pot Clavulanate] Swelling and Rash    VACCINATION STATUS:  There is no immunization history on file for this patient.  HPI  37 year old female patient with medical history as above. She is being seen in Follow-up for hypothyroidism, vitamin D deficiency, and multinodular  goiter. -She has lab evidence of  celiac disease not on treatment.  She is on treatment for anemia. -  She has family history of Graves' disease in one of her grandparents. -She was found to have multiple punctate partially cystic nodules on bilateral thyroid lobes in March 2018,Her repeat thyroid ultrasound from 03/13/2017 showed right lobe 5 cm with 0.6 cm nodule stable from prior study, left lobe stable at 4.8 cm with 0.5 cm stable nodule on the lower portion, stable or nonsuspicious on repeat visit thyroid ultrasound. -He was initiated on levothyroxine 50 mcg p.o. every morning during her last visit.  He reports feeling better. -She is interested to know other ways of managing weight. -She denies heat intolerance nor cold intolerance. She denies  any dysphagia, shortness of breath, nor voice change. -She is on antianxiety medications including Prozac and Lamictal.   Review of Systems   Constitutional: + Progressive weight gain, + fatigue, no subjective hyperthermia, no subjective hypothermia Eyes: no blurry vision, no xerophthalmia ENT: no sore throat, no nodules palpated in throat, no dysphagia/odynophagia, no hoarseness Musculoskeletal: no muscle/joint aches Skin: no rashes Neurological: - tremors, no numbness, no tingling, no dizziness Psychiatric: + depression, + anxiety  Objective:    BP 123/85   Pulse 80   Ht 5' 7.5" (1.715 m)   Wt 269 lb (122 kg)   BMI 41.51 kg/m   Wt Readings from Last 3 Encounters:  06/03/18 269 lb (122 kg)  03/18/18 265 lb 6.4 oz (120.4 kg)  03/04/18 265 lb (120.2 kg)    Physical Exam  Constitutional:  + Obese, not in acute distress, normal state of mind Eyes: PERRLA, EOMI, no exophthalmos ENT: moist mucous membranes, + palpable thyroid , no cervical lymphadenopathy  Musculoskeletal: no gross deformities, strength intact in all four extremities Skin: moist, warm, no rashes Neurological: + tremor with outstretched hands, Deep tendon reflexes normal in all four extremities.    Recent Results (from the past 2160 hour(s))  TSH     Status: None   Collection Time: 05/13/18  1:16 PM  Result Value Ref Range   TSH 1.02 mIU/L    Comment:           Reference Range .           > or = 20 Years  0.40-4.50 .                Pregnancy Ranges           First trimester    0.26-2.66           Second trimester   0.55-2.73           Third trimester    0.43-2.91   T4, free     Status: None   Collection Time: 05/13/18  1:16 PM  Result Value Ref Range   Free T4 1.2 0.8 - 1.8 ng/dL  VITAMIN D 25 Hydroxy (Vit-D Deficiency, Fractures)     Status: Abnormal   Collection Time: 05/13/18  1:16 PM  Result Value Ref Range   Vit D, 25-Hydroxy 27 (L) 30 - 100 ng/mL    Comment: Vitamin D Status         25-OH  Vitamin D: . Deficiency:                    <20 ng/mL Insufficiency:             20 - 29 ng/mL Optimal:                 >  or = 30 ng/mL . For 25-OH Vitamin D testing on patients on  D2-supplementation and patients for whom quantitation  of D2 and D3 fractions is required, the QuestAssureD(TM) 25-OH VIT D, (D2,D3), LC/MS/MS is recommended: order  code [phone] (patients >72yr). . For more information on this test, go to: http://education.questdiagnostics.com/faq/FAQ163 (This link is being provided for  informational/educational purposes only.)        October 17, 2016 lab work showed TPO antibodies normal at 14, thyroglobulin antibodies <1, free T4 1.6, TSH 1.83, free T3 3.0  Thyroid sonograms from 09/24/2016 showed right lobe 4.4 cm left lobe 4.8 cm. There are multiple punctate/partially cystic nodules, largest 0.6 cm on the left lobe, and 0.4 cm on the right lobe.  Thyroid ultrasound from 03/13/2017, right lobe 5 cm with 0.6 cm nodule, left lobe 4.8 cm with 0.5 cm stable nodule.  Her thyroid ultrasound on May 13, 2018 showed right lobe 4.9 cm, previously 5 cm; left lobe 4.8 cm unchanged from prior study in September 2018.  0.7 cm nodule on the right lobe which does not meet any imaging criteria for biopsy.  0.6 cm left lobe nodule also does not meet criteria for biopsy.   Assessment & Plan:   1.  Hypothyroidism 2.  Multinodular  Goiter 3.  Vitamin D deficiency -Based on her previsit thyroid function test, she would benefit from a higher dose of levothyroxine.  I discussed and increase her levothyroxine to 75 mcg p.o. nightly.     - We discussed about correct intake of levothyroxine, at fasting, with water, separated by at least 30 minutes from breakfast, and separated by more than 4 hours from calcium, iron, multivitamins, acid reflux medications (PPIs). -Patient is made aware of the fact that thyroid hormone replacement is needed for life, dose to be adjusted by periodic  monitoring of thyroid function tests.    -  she does have multinodular goiter- which has been stable over more than 18 months.  - She will not require immediate intervention at this time, she does not require fine-needle aspiration of the small nodules at this time.  Regarding her vitamin D deficiency: It has improved to 27.  I advised her to finish her current supplies of ergocalciferol 50,000 units weekly, and maintain with cholecalciferol 5000 units daily for the next 90 days.  Progressive weight gain: -  Suggestion is made for her to avoid simple carbohydrates  from her diet including Cakes, Sweet Desserts / Pastries, Ice Cream, Soda (diet and regular), Sweet Tea, Candies, Chips, Cookies, Store Bought Juices, Alcohol in Excess of  1-2 drinks a day, Artificial Sweeteners, and "Sugar-free" Products. This will help patient to have stable blood glucose profile and potentially avoid unintended weight gain.  -She will have screening A1c along with her next lab work.  - I advised patient to maintain close follow up with Burdine, SVirgina Evener MD for primary care needs. Follow up plan: Return in about 6 months (around 12/02/2018) for Follow up with Pre-visit Labs.  GGlade Lloyd MD Phone: [phone] Fax: [phone] This note was partially dictated with voice recognition software. Similar sounding words can be transcribed inadequately or may not  be corrected upon review.  06/03/2018, 12:49 PM

## 2018-06-27 ENCOUNTER — Other Ambulatory Visit: Payer: Self-pay | Admitting: "Endocrinology

## 2018-07-15 DIAGNOSIS — M722 Plantar fascial fibromatosis: Secondary | ICD-10-CM | POA: Diagnosis not present

## 2018-07-15 DIAGNOSIS — M79671 Pain in right foot: Secondary | ICD-10-CM | POA: Diagnosis not present

## 2018-07-15 DIAGNOSIS — M25579 Pain in unspecified ankle and joints of unspecified foot: Secondary | ICD-10-CM | POA: Diagnosis not present

## 2018-07-31 DIAGNOSIS — M79671 Pain in right foot: Secondary | ICD-10-CM | POA: Diagnosis not present

## 2018-07-31 DIAGNOSIS — M25579 Pain in unspecified ankle and joints of unspecified foot: Secondary | ICD-10-CM | POA: Diagnosis not present

## 2018-07-31 DIAGNOSIS — M722 Plantar fascial fibromatosis: Secondary | ICD-10-CM | POA: Diagnosis not present

## 2018-08-19 ENCOUNTER — Inpatient Hospital Stay (HOSPITAL_COMMUNITY): Payer: BLUE CROSS/BLUE SHIELD | Attending: Hematology

## 2018-08-19 DIAGNOSIS — D500 Iron deficiency anemia secondary to blood loss (chronic): Secondary | ICD-10-CM

## 2018-08-19 DIAGNOSIS — D508 Other iron deficiency anemias: Secondary | ICD-10-CM | POA: Diagnosis not present

## 2018-08-19 DIAGNOSIS — Z79899 Other long term (current) drug therapy: Secondary | ICD-10-CM | POA: Diagnosis not present

## 2018-08-19 DIAGNOSIS — F419 Anxiety disorder, unspecified: Secondary | ICD-10-CM | POA: Insufficient documentation

## 2018-08-19 LAB — CBC WITH DIFFERENTIAL/PLATELET
ABS IMMATURE GRANULOCYTES: 0.04 10*3/uL (ref 0.00–0.07)
BASOS ABS: 0.1 10*3/uL (ref 0.0–0.1)
Basophils Relative: 1 %
Eosinophils Absolute: 0.3 10*3/uL (ref 0.0–0.5)
Eosinophils Relative: 3 %
HCT: 39.7 % (ref 36.0–46.0)
HEMOGLOBIN: 12.3 g/dL (ref 12.0–15.0)
IMMATURE GRANULOCYTES: 0 %
Lymphocytes Relative: 19 %
Lymphs Abs: 1.9 10*3/uL (ref 0.7–4.0)
MCH: 28.2 pg (ref 26.0–34.0)
MCHC: 31 g/dL (ref 30.0–36.0)
MCV: 91.1 fL (ref 80.0–100.0)
MONO ABS: 0.5 10*3/uL (ref 0.1–1.0)
Monocytes Relative: 5 %
NEUTROS ABS: 7.2 10*3/uL (ref 1.7–7.7)
NEUTROS PCT: 72 %
NRBC: 0 % (ref 0.0–0.2)
PLATELETS: 424 10*3/uL — AB (ref 150–400)
RBC: 4.36 MIL/uL (ref 3.87–5.11)
RDW: 14.4 % (ref 11.5–15.5)
WBC: 10 10*3/uL (ref 4.0–10.5)

## 2018-08-19 LAB — COMPREHENSIVE METABOLIC PANEL
ALT: 43 U/L (ref 0–44)
AST: 33 U/L (ref 15–41)
Albumin: 4.1 g/dL (ref 3.5–5.0)
Alkaline Phosphatase: 72 U/L (ref 38–126)
Anion gap: 10 (ref 5–15)
BUN: 7 mg/dL (ref 6–20)
CALCIUM: 8.7 mg/dL — AB (ref 8.9–10.3)
CO2: 24 mmol/L (ref 22–32)
Chloride: 106 mmol/L (ref 98–111)
Creatinine, Ser: 0.63 mg/dL (ref 0.44–1.00)
GFR calc Af Amer: >60 mL/min (ref >60)
GFR calc non Af Amer: >60 mL/min (ref >60)
GLUCOSE: 99 mg/dL (ref 70–99)
Potassium: 4 mmol/L (ref 3.5–5.1)
Sodium: 140 mmol/L (ref 135–145)
Total Bilirubin: 0.4 mg/dL (ref 0.3–1.2)
Total Protein: 7.4 g/dL (ref 6.5–8.1)

## 2018-08-19 LAB — FERRITIN: Ferritin: 158 ng/mL (ref 11–307)

## 2018-08-19 LAB — LACTATE DEHYDROGENASE: LDH: 166 U/L (ref 98–192)

## 2018-08-21 DIAGNOSIS — M79671 Pain in right foot: Secondary | ICD-10-CM | POA: Diagnosis not present

## 2018-08-21 DIAGNOSIS — M79672 Pain in left foot: Secondary | ICD-10-CM | POA: Diagnosis not present

## 2018-08-21 DIAGNOSIS — M25579 Pain in unspecified ankle and joints of unspecified foot: Secondary | ICD-10-CM | POA: Diagnosis not present

## 2018-08-21 DIAGNOSIS — M722 Plantar fascial fibromatosis: Secondary | ICD-10-CM | POA: Diagnosis not present

## 2018-08-26 ENCOUNTER — Other Ambulatory Visit: Payer: Self-pay

## 2018-08-26 ENCOUNTER — Inpatient Hospital Stay (HOSPITAL_BASED_OUTPATIENT_CLINIC_OR_DEPARTMENT_OTHER): Payer: BLUE CROSS/BLUE SHIELD | Admitting: Hematology

## 2018-08-26 ENCOUNTER — Encounter (HOSPITAL_COMMUNITY): Payer: Self-pay | Admitting: Hematology

## 2018-08-26 VITALS — BP 123/67 | HR 103 | Temp 98.2°F | Resp 18 | Wt 268.0 lb

## 2018-08-26 DIAGNOSIS — D508 Other iron deficiency anemias: Secondary | ICD-10-CM | POA: Diagnosis not present

## 2018-08-26 DIAGNOSIS — F419 Anxiety disorder, unspecified: Secondary | ICD-10-CM

## 2018-08-26 DIAGNOSIS — Z79899 Other long term (current) drug therapy: Secondary | ICD-10-CM | POA: Diagnosis not present

## 2018-08-26 DIAGNOSIS — D500 Iron deficiency anemia secondary to blood loss (chronic): Secondary | ICD-10-CM

## 2018-08-26 NOTE — Progress Notes (Signed)
[place], [place] [address]   CLINIC:  Medical Oncology/Hematology  PCP:  [name], MD [place] [address] [phone]   REASON FOR VISIT: Follow-up for Iron deficiency anemia due to chronic blood loss  CURRENT THERAPY: Intermittent iron infusions.    INTERVAL HISTORY:  [name] 38 y.o. female returns for routine follow-up for Iron deficiency anemia due to chronic blood loss. She is feeling more fatigue since December. She isn't able to do as many daily activities due to the low energy. She has occasional abdominal pain, nausea, and diarrhea. Denies any vomiting. Denies any new pains. Had not noticed any recent bleeding such as epistaxis, hematuria or hematochezia. Denies recent chest pain on exertion, shortness of breath on minimal exertion, pre-syncopal episodes, or palpitations. Denies any numbness or tingling in hands or feet. Denies any recent fevers, infections, or recent hospitalizations. Patient reports appetite at 100% and energy level at 100%.    REVIEW OF SYSTEMS:  Review of Systems  Constitutional: Positive for fatigue.  All other systems reviewed and are negative.    PAST MEDICAL/SURGICAL HISTORY:  Past Medical History:  Diagnosis Date  . Anxiety   . Environmental allergies   . GERD (gastroesophageal reflux disease)   . IBS (irritable bowel syndrome)   . Tubal ectopic pregnancy    Past Surgical History:  Procedure Laterality Date  . CHOLECYSTECTOMY  Sept of 2012 gallstones  . COLONOSCOPY  02/27/2012   Procedure: COLONOSCOPY;  Surgeon: Rogene Houston, MD;  [facility];  Service: Endoscopy;  Laterality: N/A;  225  . rt ovary      removed 03/2010 for 4 pound tumor  . TUBAL LIGATION Bilateral 04/2013     SOCIAL HISTORY:  Social History   Socioeconomic History  . Marital status: Married    Spouse name: Not on file  . Number of children: Not on file  . Years of education: Not  on file  . Highest education level: Not on file  Occupational History  . Not on file  Social Needs  . Financial resource strain: Not on file  . Food insecurity:    Worry: Not on file    Inability: Not on file  . Transportation needs:    Medical: Not on file    Non-medical: Not on file  Tobacco Use  . Smoking status: Never Smoker  . Smokeless tobacco: Never Used  Substance and Sexual Activity  . Alcohol use: No  . Drug use: No  . Sexual activity: Yes  Lifestyle  . Physical activity:    Days per week: Not on file    Minutes per session: Not on file  . Stress: Not on file  Relationships  . Social connections:    Talks on phone: Not on file    Gets together: Not on file    Attends religious service: Not on file    Active member of club or organization: Not on file    Attends meetings of clubs or organizations: Not on file    Relationship status: Not on file  . Intimate partner violence:    Fear of current or ex partner: Not on file    Emotionally abused: Not on file    Physically abused: Not on file    Forced sexual activity: Not on file  Other Topics Concern  . Not on file  Social History Narrative  . Not on file    FAMILY HISTORY:  Family History  Problem Relation Age of Onset  . Epilepsy Mother   . Epilepsy Brother   . Lupus Brother     CURRENT MEDICATIONS:  Outpatient Encounter Medications as of 08/26/2018  Medication Sig  . ergocalciferol (VITAMIN D2) 50000 units capsule Take 1 capsule (50,000 Units total) by mouth once a week.  . fexofenadine (ALLEGRA) 180 MG tablet Take 180 mg by mouth as needed.   . fluconazole (DIFLUCAN) 150 MG tablet   . FLUoxetine (PROZAC) 20 MG capsule Take 60 mg by mouth daily.  Marland Kitchen lamoTRIgine (LAMICTAL) 100 MG tablet Take 100 mg by mouth daily.  Marland Kitchen levothyroxine (SYNTHROID, LEVOTHROID) 50 MCG tablet TAKE 1 TABLET BY MOUTH ONCE DAILY BEFORE BREAKFAST  . omeprazole (PRILOSEC) 40 MG capsule Take 1 capsule (40 mg total) by mouth daily.    . [DISCONTINUED] dicyclomine (BENTYL) 10 MG capsule Take 1 capsule (10 mg total) by mouth daily.  . [DISCONTINUED] levothyroxine (SYNTHROID, LEVOTHROID) 75 MCG tablet Take 1 tablet (75 mcg total) by mouth daily before breakfast.   No facility-administered encounter medications on file as of 08/26/2018.     ALLERGIES:  Allergies  Allergen Reactions  . Penicillins Rash  . Casein Other (See Comments)    G.I. Upset  . Augmentin [Amoxicillin-Pot Clavulanate] Swelling and Rash     PHYSICAL EXAM:  ECOG Performance status: 1  Vitals:   08/26/18 1100  BP: 123/67  Pulse: (!) 103  Resp: 18  Temp: 98.2 F (36.8 C)  SpO2: 95%   Filed Weights   08/26/18 1100  Weight: 268 lb (121.6 kg)    Physical Exam Constitutional:      Appearance: Normal appearance. She is normal weight.  Abdominal:     General: Abdomen is flat.     Palpations: Abdomen is soft.  Musculoskeletal: Normal range of motion.  Skin:    General: Skin is warm and dry.  Neurological:     Mental Status: She is alert and oriented to person, place, and time. Mental status is at baseline.  Psychiatric:        Mood and Affect: Mood normal.        Behavior: Behavior normal.        Thought Content: Thought content normal.        Judgment: Judgment normal.    Chest: Bilateral clear to auscultation CVS: S1-S2 regular rate and rhythm  LABORATORY DATA:  I have reviewed the labs as listed.  CBC    Component Value Date/Time   WBC 10.0 08/19/2018 1057   RBC 4.36 08/19/2018 1057   HGB 12.3 08/19/2018 1057   HCT 39.7 08/19/2018 1057   PLT 424 (H) 08/19/2018 1057   MCV 91.1 08/19/2018 1057   MCH 28.2 08/19/2018 1057   MCHC 31.0 08/19/2018 1057   RDW 14.4 08/19/2018 1057   LYMPHSABS 1.9 08/19/2018 1057   MONOABS 0.5 08/19/2018 1057   EOSABS 0.3 08/19/2018 1057   BASOSABS 0.1 08/19/2018 1057   CMP Latest Ref Rng & Units 08/19/2018 02/14/2018 07/19/2017  Glucose 70 - 99 mg/dL 99 107(H) 126(H)  BUN 6 - 20 mg/dL 7 10 10    Creatinine 0.44 - 1.00 mg/dL 0.63 0.67 0.58  Sodium 135 - 145 mmol/L 140 137 134(L)  Potassium 3.5 - 5.1 mmol/L 4.0 3.7 3.9  Chloride 98 - 111 mmol/L 106 106 100(L)  CO2 22 - 32 mmol/L 24 23 24   Calcium 8.9 - 10.3 mg/dL 8.7(L) 8.8(L) 9.1  Total Protein 6.5 - 8.1 g/dL 7.4 7.6 7.3  Total Bilirubin 0.3 - 1.2 mg/dL 0.4 0.4 0.3  Alkaline Phos 38 - 126 U/L 72 79 72  AST 15 - 41 U/L 33 29 20  ALT 0 - 44 U/L 43 41 26       DIAGNOSTIC IMAGING:  I have independently reviewed the scans and discussed with the patient.   I have reviewed Francene Finders, NP's note and agree with the documentation.  I personally performed a face-to-face visit, made revisions and my assessment and plan is as follows.    ASSESSMENT & PLAN:   Iron deficiency anemia 1.  Iron deficiency state: - Patient requires intermittent iron infusions.  Last Feraheme was on 03/01/2017. -She complains of feeling tired since Christmas.  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 -We reviewed her blood work today.  Hemoglobin is 12.3.  Ferritin is 158, down from 172. -Because of her severe tiredness, I have recommended 1 infusion of Feraheme. -We will see her back in 4 months for follow-up.  We will repeat ferritin, iron panel, K81 and folic acid prior to next visit.      Orders placed this encounter:  Orders Placed This Encounter  Procedures  . CBC with Differential/Platelet  . Comprehensive metabolic panel  . Ferritin  . Iron and TIBC  . Vitamin B12  . Folate  . VITAMIN D 25 Hydroxy (Vit-D Deficiency, Fractures)      Derek Jack, MD [place] [phone]

## 2018-08-26 NOTE — Assessment & Plan Note (Signed)
1.  Iron deficiency state: - Patient requires intermittent iron infusions.  Last Feraheme was on 03/01/2017. -She complains of feeling tired since Christmas.  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 -We reviewed her blood work today.  Hemoglobin is 12.3.  Ferritin is 158, down from 172. -Because of her severe tiredness, I have recommended 1 infusion of Feraheme. -We will see her back in 4 months for follow-up.  We will repeat ferritin, iron panel, X91 and folic acid prior to next visit.

## 2018-08-26 NOTE — Patient Instructions (Signed)
[facility] at [facility] Discharge Instructions     Thank you for choosing [facility] at [facility] to provide your oncology and hematology care.  To afford each patient quality time with our provider, please arrive at least 15 minutes before your scheduled appointment time.   If you have a lab appointment with the Cancer Center please come in thru the  Main Entrance and check in at the main information desk  You need to re-schedule your appointment should you arrive 10 or more minutes late.  We strive to give you quality time with our providers, and arriving late affects you and other patients whose appointments are after yours.  Also, if you no show three or more times for appointments you may be dismissed from the clinic at the providers discretion.     Again, thank you for choosing [facility].  Our hope is that these requests will decrease the amount of time that you wait before being seen by our physicians.       _____________________________________________________________  Should you have questions after your visit to [facility], please contact our office at [phone] between the hours of 8:00 a.m. and 4:30 p.m.  Voicemails left after 4:00 p.m. will not be returned until the following business day.  For prescription refill requests, have your pharmacy contact our office and allow 72 hours.    Cancer Center Support Programs:   > Cancer Support Group  2nd Tuesday of the month 1pm-2pm, Journey Room    

## 2018-08-29 ENCOUNTER — Inpatient Hospital Stay (HOSPITAL_COMMUNITY): Payer: BLUE CROSS/BLUE SHIELD

## 2018-08-29 ENCOUNTER — Encounter (HOSPITAL_COMMUNITY): Payer: Self-pay

## 2018-08-29 ENCOUNTER — Ambulatory Visit (HOSPITAL_COMMUNITY): Payer: BLUE CROSS/BLUE SHIELD

## 2018-08-29 VITALS — BP 124/67 | HR 85 | Temp 97.5°F | Resp 18

## 2018-08-29 DIAGNOSIS — Z79899 Other long term (current) drug therapy: Secondary | ICD-10-CM | POA: Diagnosis not present

## 2018-08-29 DIAGNOSIS — D508 Other iron deficiency anemias: Secondary | ICD-10-CM | POA: Diagnosis not present

## 2018-08-29 DIAGNOSIS — F419 Anxiety disorder, unspecified: Secondary | ICD-10-CM | POA: Diagnosis not present

## 2018-08-29 DIAGNOSIS — D500 Iron deficiency anemia secondary to blood loss (chronic): Secondary | ICD-10-CM

## 2018-08-29 MED ORDER — SODIUM CHLORIDE 0.9 % IV SOLN
INTRAVENOUS | Status: DC
  Administered 2018-08-29: 13:00:00 via INTRAVENOUS

## 2018-08-29 MED ORDER — SODIUM CHLORIDE 0.9 % IV SOLN
510.0000 mg | Freq: Once | INTRAVENOUS | Status: AC
  Administered 2018-08-29: 510 mg via INTRAVENOUS
  Filled 2018-08-29: qty 17

## 2018-08-29 NOTE — Patient Instructions (Signed)
[facility] at [facility] Discharge Instructions  Received Feraheme infusion today. Follow-up as scheduled. Call clinic for any questions or concerns   Thank you for choosing [facility] at [facility] to provide your oncology and hematology care.  To afford each patient quality time with our provider, please arrive at least 15 minutes before your scheduled appointment time.   If you have a lab appointment with the Cancer Center please come in thru the  Main Entrance and check in at the main information desk  You need to re-schedule your appointment should you arrive 10 or more minutes late.  We strive to give you quality time with our providers, and arriving late affects you and other patients whose appointments are after yours.  Also, if you no show three or more times for appointments you may be dismissed from the clinic at the providers discretion.     Again, thank you for choosing [facility].  Our hope is that these requests will decrease the amount of time that you wait before being seen by our physicians.       _____________________________________________________________  Should you have questions after your visit to [facility], please contact our office at [phone] between the hours of 8:00 a.m. and 4:30 p.m.  Voicemails left after 4:00 p.m. will not be returned until the following business day.  For prescription refill requests, have your pharmacy contact our office and allow 72 hours.    Cancer Center Support Programs:   > Cancer Support Group  2nd Tuesday of the month 1pm-2pm, Journey Room   

## 2018-08-29 NOTE — Progress Notes (Signed)
Maureen Yates tolerated Feraheme infusion well without complaints or incident. VSS upon discharge. Peripheral IV site checked with positive blood return noted prior to and after infusion. Pt discharged self ambulatory in satisfactory condition

## 2018-09-21 ENCOUNTER — Other Ambulatory Visit: Payer: Self-pay | Admitting: "Endocrinology

## 2018-09-22 ENCOUNTER — Other Ambulatory Visit: Payer: Self-pay

## 2018-09-22 MED ORDER — LEVOTHYROXINE SODIUM 50 MCG PO TABS: ORAL_TABLET | ORAL | 0 refills | Status: DC

## 2018-10-06 ENCOUNTER — Encounter (HOSPITAL_COMMUNITY): Payer: Self-pay | Admitting: Nurse Practitioner

## 2018-11-25 DIAGNOSIS — R739 Hyperglycemia, unspecified: Secondary | ICD-10-CM | POA: Diagnosis not present

## 2018-11-26 LAB — HEMOGLOBIN A1C
EAG (MMOL/L): 6 (calc)
Hgb A1c MFr Bld: 5.4 % of total Hgb (ref <5.7)
Mean Plasma Glucose: 108 (calc)

## 2018-11-26 LAB — TSH: TSH: 1.18 mIU/L

## 2018-11-26 LAB — VITAMIN D 25 HYDROXY (VIT D DEFICIENCY, FRACTURES): Vit D, 25-Hydroxy: 25 ng/mL — ABNORMAL LOW (ref 30–100)

## 2018-11-26 LAB — T4, FREE: FREE T4: 1.2 ng/dL (ref 0.8–1.8)

## 2018-12-02 ENCOUNTER — Ambulatory Visit (INDEPENDENT_AMBULATORY_CARE_PROVIDER_SITE_OTHER): Payer: BLUE CROSS/BLUE SHIELD | Admitting: "Endocrinology

## 2018-12-02 ENCOUNTER — Encounter: Payer: Self-pay | Admitting: "Endocrinology

## 2018-12-02 ENCOUNTER — Other Ambulatory Visit: Payer: Self-pay

## 2018-12-02 VITALS — BP 119/85 | HR 90 | Ht 67.5 in | Wt 269.0 lb

## 2018-12-02 DIAGNOSIS — E038 Other specified hypothyroidism: Secondary | ICD-10-CM

## 2018-12-02 DIAGNOSIS — E559 Vitamin D deficiency, unspecified: Secondary | ICD-10-CM

## 2018-12-02 DIAGNOSIS — E063 Autoimmune thyroiditis: Secondary | ICD-10-CM

## 2018-12-02 MED ORDER — LEVOTHYROXINE SODIUM 75 MCG PO TABS: ORAL_TABLET | ORAL | 1 refills | Status: DC

## 2018-12-02 NOTE — Progress Notes (Signed)
Endocrinology follow-up note   Subjective:    Patient ID: Maureen Yates, female    DOB: [birth year], PCP Maureen Yates, Maureen Evener, Maureen Yates   Past Medical History:  Diagnosis Date  . Anxiety   . Environmental allergies   . GERD (gastroesophageal reflux disease)   . IBS (irritable bowel syndrome)   . Tubal ectopic pregnancy    Past Surgical History:  Procedure Laterality Date  . CHOLECYSTECTOMY  Sept of 2012 gallstones  . COLONOSCOPY  02/27/2012   Procedure: COLONOSCOPY;  Surgeon: Rogene Houston, Maureen Yates;  [facility];  Service: Endoscopy;  Laterality: N/A;  225  . rt ovary      removed 03/2010 for 4 pound tumor  . TUBAL LIGATION Bilateral 04/2013   Social History   Socioeconomic History  . Marital status: Married    Spouse name: Not on file  . Number of children: Not on file  . Years of education: Not on file  . Highest education level: Not on file  Occupational History  . Not on file  Social Needs  . Financial resource strain: Not on file  . Food insecurity:    Worry: Not on file    Inability: Not on file  . Transportation needs:    Medical: Not on file    Non-medical: Not on file  Tobacco Use  . Smoking status: Never Smoker  . Smokeless tobacco: Never Used  Substance and Sexual Activity  . Alcohol use: No  . Drug use: No  . Sexual activity: Yes  Lifestyle  . Physical activity:    Days per week: Not on file    Minutes per session: Not on file  . Stress: Not on file  Relationships  . Social connections:    Talks on phone: Not on file    Gets together: Not on file    Attends religious service: Not on file    Active member of club or organization: Not on file    Attends meetings of clubs or organizations: Not on file    Relationship status: Not on file  Other Topics Concern  . Not on file  Social History Narrative  . Not on file   Outpatient Encounter Medications as of 12/02/2018  Medication Sig  . Cholecalciferol (VITAMIN D) 125 MCG (5000 UT) CAPS  Take by mouth daily.  . fexofenadine (ALLEGRA) 180 MG tablet Take 180 mg by mouth as needed.   . fluconazole (DIFLUCAN) 150 MG tablet   . FLUoxetine (PROZAC) 20 MG capsule Take 60 mg by mouth daily.  Marland Kitchen lamoTRIgine (LAMICTAL) 100 MG tablet Take 100 mg by mouth daily.  Marland Kitchen levothyroxine (SYNTHROID) 75 MCG tablet TAKE 1 TABLET BY MOUTH ONCE DAILY BEFORE BREAKFAST  . omeprazole (PRILOSEC) 40 MG capsule Take 1 capsule (40 mg total) by mouth daily.  . [DISCONTINUED] ergocalciferol (VITAMIN D2) 50000 units capsule Take 1 capsule (50,000 Units total) by mouth once a week. (Patient not taking: Reported on 12/02/2018)  . [DISCONTINUED] levothyroxine (SYNTHROID, LEVOTHROID) 50 MCG tablet TAKE 1 TABLET BY MOUTH ONCE DAILY BEFORE BREAKFAST  . [DISCONTINUED] levothyroxine (SYNTHROID, LEVOTHROID) 50 MCG tablet TAKE 1 TABLET BY MOUTH ONCE DAILY BEFORE BREAKFAST   No facility-administered encounter medications on file as of 12/02/2018.    ALLERGIES: Allergies  Allergen Reactions  . Penicillins Rash  . Casein Other (See Comments)    G.I. Upset  . Augmentin [Amoxicillin-Pot Clavulanate] Swelling and Rash    VACCINATION STATUS:  There is no immunization history on file for  this patient.  HPI  38 year old female patient with medical history as above. She is being seen in Follow-up for hypothyroidism, vitamin D deficiency, and multinodular goiter. -She has lab evidence of  celiac disease not on treatment.  She is on treatment for anemia. -  She has family history of Graves' disease in one of her grandparents. -She is currently on levothyroxine 75 mcg p.o. nightly.  She tolerates this medication very well.  She has no new complaints today. -She denies heat intolerance nor cold intolerance. She denies any dysphagia, shortness of breath, nor voice change. -She is on antianxiety medications including Prozac and Lamictal.   Review of Systems   Constitutional: + Unable to lose weight, + fatigue, no subjective  hyperthermia, no subjective hypothermia Eyes: no blurry vision, no xerophthalmia ENT: no sore throat, no nodules palpated in throat, no dysphagia/odynophagia, no hoarseness Musculoskeletal: no muscle/joint aches Skin: no rashes Neurological: - tremors, no numbness, no tingling, no dizziness Psychiatric: + depression, + anxiety  Objective:    BP 119/85   Pulse 90   Ht 5' 7.5" (1.715 m)   Wt 269 lb (122 kg)   BMI 41.51 kg/m   Wt Readings from Last 3 Encounters:  12/02/18 269 lb (122 kg)  08/26/18 268 lb (121.6 kg)  06/03/18 269 lb (122 kg)    Physical Exam  Constitutional:  + Obese, not in acute distress, normal state of mind Eyes: PERRLA, EOMI, no exophthalmos ENT: moist mucous membranes, + palpable thyroid , no cervical lymphadenopathy  Musculoskeletal: no gross deformities, strength intact in all four extremities Skin: moist, warm, no rashes Neurological: + tremor with outstretched hands, Deep tendon reflexes normal in all four extremities.    Recent Results (from the past 2160 hour(s))  TSH     Status: None   Collection Time: 11/25/18  2:25 PM  Result Value Ref Range   TSH 1.18 mIU/L    Comment:           Reference Range .           > or = 20 Years  0.40-4.50 .                Pregnancy Ranges           First trimester    0.26-2.66           Second trimester   0.55-2.73           Third trimester    0.43-2.91   Hemoglobin A1c     Status: None   Collection Time: 11/25/18  2:25 PM  Result Value Ref Range   Hgb A1c MFr Bld 5.4 <5.7 % of total Hgb    Comment: For the purpose of screening for the presence of diabetes: . <5.7%       Consistent with the absence of diabetes 5.7-6.4%    Consistent with increased risk for diabetes             (prediabetes) > or =6.5%  Consistent with diabetes . This assay result is consistent with a decreased risk of diabetes. . Currently, no consensus exists regarding use of hemoglobin A1c for diagnosis of diabetes in  children. . According to American Diabetes Association (ADA) guidelines, hemoglobin A1c <7.0% represents optimal control in non-pregnant diabetic patients. Different metrics may apply to specific patient populations.  Standards of Medical Care in Diabetes(ADA). .    Mean Plasma Glucose 108 (calc)   eAG (mmol/L) 6.0 (calc)  T4, free  Status: None   Collection Time: 11/25/18  2:25 PM  Result Value Ref Range   Free T4 1.2 0.8 - 1.8 ng/dL  VITAMIN D 25 Hydroxy (Vit-D Deficiency, Fractures)     Status: Abnormal   Collection Time: 11/25/18  2:25 PM  Result Value Ref Range   Vit D, 25-Hydroxy 25 (L) 30 - 100 ng/mL    Comment: Vitamin D Status         25-OH Vitamin D: . Deficiency:                    <20 ng/mL Insufficiency:             20 - 29 ng/mL Optimal:                 > or = 30 ng/mL . For 25-OH Vitamin D testing on patients on  D2-supplementation and patients for whom quantitation  of D2 and D3 fractions is required, the [employer](TM) 25-OH VIT D, (D2,D3), LC/MS/MS is recommended: order  code [phone] (patients >63yr). See Note 1 . Note 1 . For additional information, please refer to  http://education.[facility].com/faq/FAQ199  (This link is being provided for informational/ educational purposes only.)        October 17, 2016 lab work showed TPO antibodies normal at 14, thyroglobulin antibodies <1, free T4 1.6, TSH 1.83, free T3 3.0  Thyroid sonograms from 09/24/2016 showed right lobe 4.4 cm left lobe 4.8 cm. There are multiple punctate/partially cystic nodules, largest 0.6 cm on the left lobe, and 0.4 cm on the right lobe.  Thyroid ultrasound from 03/13/2017, right lobe 5 cm with 0.6 cm nodule, left lobe 4.8 cm with 0.5 cm stable nodule.  Her thyroid ultrasound on May 13, 2018 showed right lobe 4.9 cm, previously 5 cm; left lobe 4.8 cm unchanged from prior study in September 2018.  0.7 cm nodule on the right lobe which does not meet any imaging criteria  for biopsy.  0.6 cm left lobe nodule also does not meet criteria for biopsy.   Assessment & Plan:   1.  Hypothyroidism 2.  Multinodular  Goiter 3.  Vitamin D deficiency -Her previsit thyroid function tests are consistent with appropriate replacement.  She is advised to continue levothyroxine 75 mcg p.o. every morning.     - We discussed about the correct intake of her thyroid hormone, on empty stomach at fasting, with water, separated by at least 30 minutes from breakfast and other medications,  and separated by more than 4 hours from calcium, iron, multivitamins, acid reflux medications (PPIs). -Patient is made aware of the fact that thyroid hormone replacement is needed for life, dose to be adjusted by periodic monitoring of thyroid function tests.  -  she does have multinodular goiter- which has been stable over more than 18 months.  - She will not require immediate intervention at this time, she does not require fine-needle aspiration of the small nodules at this time.  She will be considered for repeat thyroid ultrasound in 1 year.  Regarding her vitamin D deficiency: It remains low normal at 25.  She is advised to continue vitamin D3 5000 units daily.    - I advised patient to maintain close follow up with Maureen Yates, SVirgina Evener Maureen Yates for primary care needs. Follow up plan: Return in about 6 months (around 06/04/2019) for Follow up with Pre-visit Labs.   Time for this visit 15 minutes.  CClovis Caoparticipated in the discussions, expressed understanding, and voiced agreement  with the above plans.  All questions were answered to her satisfaction. she is encouraged to contact clinic should she have any questions or concerns prior to her return visit.  Glade Lloyd, Maureen Yates Phone: [phone]  Fax: [phone]  This note was partially dictated with voice recognition software. Similar sounding words can be transcribed inadequately or may not  be corrected upon review.  12/02/2018, 1:22  PM

## 2018-12-25 ENCOUNTER — Other Ambulatory Visit (HOSPITAL_COMMUNITY): Payer: BLUE CROSS/BLUE SHIELD

## 2018-12-26 ENCOUNTER — Ambulatory Visit (HOSPITAL_COMMUNITY): Payer: BLUE CROSS/BLUE SHIELD | Admitting: Nurse Practitioner

## 2019-02-12 DIAGNOSIS — Z6841 Body Mass Index (BMI) 40.0 and over, adult: Secondary | ICD-10-CM | POA: Diagnosis not present

## 2019-02-12 DIAGNOSIS — Z01419 Encounter for gynecological examination (general) (routine) without abnormal findings: Secondary | ICD-10-CM | POA: Diagnosis not present

## 2019-02-18 ENCOUNTER — Other Ambulatory Visit: Payer: Self-pay

## 2019-02-18 MED ORDER — LEVOTHYROXINE SODIUM 75 MCG PO TABS: ORAL_TABLET | ORAL | 1 refills | Status: DC

## 2019-03-02 ENCOUNTER — Inpatient Hospital Stay (HOSPITAL_COMMUNITY): Payer: BC Managed Care – PPO | Attending: Hematology

## 2019-03-02 ENCOUNTER — Other Ambulatory Visit: Payer: Self-pay

## 2019-03-02 DIAGNOSIS — Z832 Family history of diseases of the blood and blood-forming organs and certain disorders involving the immune mechanism: Secondary | ICD-10-CM | POA: Insufficient documentation

## 2019-03-02 DIAGNOSIS — E559 Vitamin D deficiency, unspecified: Secondary | ICD-10-CM | POA: Diagnosis not present

## 2019-03-02 DIAGNOSIS — K219 Gastro-esophageal reflux disease without esophagitis: Secondary | ICD-10-CM | POA: Diagnosis not present

## 2019-03-02 DIAGNOSIS — K625 Hemorrhage of anus and rectum: Secondary | ICD-10-CM | POA: Diagnosis not present

## 2019-03-02 DIAGNOSIS — D509 Iron deficiency anemia, unspecified: Secondary | ICD-10-CM | POA: Diagnosis not present

## 2019-03-02 DIAGNOSIS — D500 Iron deficiency anemia secondary to blood loss (chronic): Secondary | ICD-10-CM

## 2019-03-02 DIAGNOSIS — K589 Irritable bowel syndrome without diarrhea: Secondary | ICD-10-CM | POA: Diagnosis not present

## 2019-03-02 DIAGNOSIS — Z79899 Other long term (current) drug therapy: Secondary | ICD-10-CM | POA: Insufficient documentation

## 2019-03-02 DIAGNOSIS — F419 Anxiety disorder, unspecified: Secondary | ICD-10-CM | POA: Diagnosis not present

## 2019-03-02 DIAGNOSIS — R5383 Other fatigue: Secondary | ICD-10-CM | POA: Insufficient documentation

## 2019-03-02 LAB — COMPREHENSIVE METABOLIC PANEL
ALT: 29 U/L (ref 0–44)
AST: 20 U/L (ref 15–41)
Albumin: 4.2 g/dL (ref 3.5–5.0)
Alkaline Phosphatase: 78 U/L (ref 38–126)
Anion gap: 9 (ref 5–15)
BUN: 12 mg/dL (ref 6–20)
CO2: 24 mmol/L (ref 22–32)
Calcium: 8.9 mg/dL (ref 8.9–10.3)
Chloride: 106 mmol/L (ref 98–111)
Creatinine, Ser: 0.6 mg/dL (ref 0.44–1.00)
GFR calc Af Amer: >60 mL/min (ref >60)
GFR calc non Af Amer: >60 mL/min (ref >60)
Glucose, Bld: 107 mg/dL — ABNORMAL HIGH (ref 70–99)
Potassium: 3.8 mmol/L (ref 3.5–5.1)
Sodium: 139 mmol/L (ref 135–145)
Total Bilirubin: 0.2 mg/dL — ABNORMAL LOW (ref 0.3–1.2)
Total Protein: 7.6 g/dL (ref 6.5–8.1)

## 2019-03-02 LAB — IRON AND TIBC
Iron: 51 ug/dL (ref 28–170)
Saturation Ratios: 13 % (ref 10.4–31.8)
TIBC: 383 ug/dL (ref 250–450)
UIBC: 332 ug/dL

## 2019-03-02 LAB — CBC WITH DIFFERENTIAL/PLATELET
Abs Immature Granulocytes: 0.05 10*3/uL (ref 0.00–0.07)
Basophils Absolute: 0.1 10*3/uL (ref 0.0–0.1)
Basophils Relative: 1 %
Eosinophils Absolute: 0.5 10*3/uL (ref 0.0–0.5)
Eosinophils Relative: 5 %
HCT: 39.8 % (ref 36.0–46.0)
Hemoglobin: 12.7 g/dL (ref 12.0–15.0)
Immature Granulocytes: 0 %
Lymphocytes Relative: 19 %
Lymphs Abs: 2.1 10*3/uL (ref 0.7–4.0)
MCH: 29 pg (ref 26.0–34.0)
MCHC: 31.9 g/dL (ref 30.0–36.0)
MCV: 90.9 fL (ref 80.0–100.0)
Monocytes Absolute: 0.4 10*3/uL (ref 0.1–1.0)
Monocytes Relative: 4 %
Neutro Abs: 8.2 10*3/uL — ABNORMAL HIGH (ref 1.7–7.7)
Neutrophils Relative %: 71 %
Platelets: 393 10*3/uL (ref 150–400)
RBC: 4.38 MIL/uL (ref 3.87–5.11)
RDW: 14.2 % (ref 11.5–15.5)
WBC: 11.4 10*3/uL — ABNORMAL HIGH (ref 4.0–10.5)
nRBC: 0 % (ref 0.0–0.2)

## 2019-03-02 LAB — VITAMIN B12: Vitamin B-12: 499 pg/mL (ref 180–914)

## 2019-03-02 LAB — FOLATE: Folate: 11.3 ng/mL (ref >5.9)

## 2019-03-02 LAB — FERRITIN: Ferritin: 235 ng/mL (ref 11–307)

## 2019-03-03 LAB — VITAMIN D 25 HYDROXY (VIT D DEFICIENCY, FRACTURES): Vit D, 25-Hydroxy: 21.3 ng/mL — ABNORMAL LOW (ref 30.0–100.0)

## 2019-03-04 ENCOUNTER — Encounter (HOSPITAL_COMMUNITY): Payer: Self-pay | Admitting: Nurse Practitioner

## 2019-03-04 ENCOUNTER — Inpatient Hospital Stay (HOSPITAL_COMMUNITY): Payer: BC Managed Care – PPO

## 2019-03-04 ENCOUNTER — Other Ambulatory Visit: Payer: Self-pay

## 2019-03-04 ENCOUNTER — Inpatient Hospital Stay (HOSPITAL_BASED_OUTPATIENT_CLINIC_OR_DEPARTMENT_OTHER): Payer: BC Managed Care – PPO | Admitting: Nurse Practitioner

## 2019-03-04 VITALS — BP 133/82 | HR 90 | Temp 97.1°F | Resp 20 | Wt 271.4 lb

## 2019-03-04 DIAGNOSIS — R5383 Other fatigue: Secondary | ICD-10-CM

## 2019-03-04 DIAGNOSIS — K219 Gastro-esophageal reflux disease without esophagitis: Secondary | ICD-10-CM | POA: Diagnosis not present

## 2019-03-04 DIAGNOSIS — E559 Vitamin D deficiency, unspecified: Secondary | ICD-10-CM

## 2019-03-04 DIAGNOSIS — Z79899 Other long term (current) drug therapy: Secondary | ICD-10-CM | POA: Diagnosis not present

## 2019-03-04 DIAGNOSIS — D509 Iron deficiency anemia, unspecified: Secondary | ICD-10-CM | POA: Diagnosis not present

## 2019-03-04 DIAGNOSIS — K625 Hemorrhage of anus and rectum: Secondary | ICD-10-CM | POA: Diagnosis not present

## 2019-03-04 DIAGNOSIS — Z832 Family history of diseases of the blood and blood-forming organs and certain disorders involving the immune mechanism: Secondary | ICD-10-CM | POA: Diagnosis not present

## 2019-03-04 DIAGNOSIS — F419 Anxiety disorder, unspecified: Secondary | ICD-10-CM | POA: Diagnosis not present

## 2019-03-04 DIAGNOSIS — K589 Irritable bowel syndrome without diarrhea: Secondary | ICD-10-CM | POA: Diagnosis not present

## 2019-03-04 LAB — SEDIMENTATION RATE: Sed Rate: 32 mm/hr — ABNORMAL HIGH (ref 0–22)

## 2019-03-04 LAB — C-REACTIVE PROTEIN: CRP: 1.8 mg/dL — ABNORMAL HIGH (ref <1.0)

## 2019-03-04 MED ORDER — ERGOCALCIFEROL 1.25 MG (50000 UT) PO CAPS: 50000.0000 [IU] | ORAL_CAPSULE | ORAL | 1 refills | Status: DC

## 2019-03-04 NOTE — Progress Notes (Signed)
[place], [place] [address]   CLINIC:  Medical Oncology/Hematology  PCP:  [name], MD [place] [id number] [phone]   REASON FOR VISIT: Follow-up for iron deficiency anemia  CURRENT THERAPY: Intermittent iron infusions   INTERVAL HISTORY:  Maureen Yates 38 y.o. female returns for routine follow-up for iron deficiency anemia.  Patient reports severe fatigue throughout the day.  She has problems staying awake during the day as well.  She denies any bright red bleeding per rectum or melena. Denies any nausea, vomiting, or diarrhea. Denies any new pains. Had not noticed any recent bleeding such as epistaxis, hematuria or hematochezia. Denies recent chest pain on exertion, shortness of breath on minimal exertion, pre-syncopal episodes, or palpitations. Denies any numbness or tingling in hands or feet. Denies any recent fevers, infections, or recent hospitalizations. Patient reports appetite at 75% and energy level at 25%.  She is eating well maintaining her weight at this time.    REVIEW OF SYSTEMS:  Review of Systems  Constitutional: Positive for fatigue.  All other systems reviewed and are negative.    PAST MEDICAL/SURGICAL HISTORY:  Past Medical History:  Diagnosis Date  . Anxiety   . Environmental allergies   . GERD (gastroesophageal reflux disease)   . IBS (irritable bowel syndrome)   . Tubal ectopic pregnancy    Past Surgical History:  Procedure Laterality Date  . CHOLECYSTECTOMY  Sept of 2012 gallstones  . COLONOSCOPY  02/27/2012   Procedure: COLONOSCOPY;  Surgeon: Rogene Houston, MD;  [facility];  Service: Endoscopy;  Laterality: N/A;  225  . rt ovary      removed 03/2010 for 4 pound tumor  . TUBAL LIGATION Bilateral 04/2013     SOCIAL HISTORY:  Social History   Socioeconomic History  . Marital status: Married    Spouse name: Not on file  . Number of children: Not on file  . Years  of education: Not on file  . Highest education level: Not on file  Occupational History  . Not on file  Social Needs  . Financial resource strain: Not on file  . Food insecurity    Worry: Not on file    Inability: Not on file  . Transportation needs    Medical: Not on file    Non-medical: Not on file  Tobacco Use  . Smoking status: Never Smoker  . Smokeless tobacco: Never Used  Substance and Sexual Activity  . Alcohol use: No  . Drug use: No  . Sexual activity: Yes  Lifestyle  . Physical activity    Days per week: Not on file    Minutes per session: Not on file  . Stress: Not on file  Relationships  . Social Herbalist on phone: Not on file    Gets together: Not on file    Attends religious service: Not on file    Active member of club or organization: Not on file    Attends meetings of clubs or organizations: Not on file    Relationship status: Not on file  . Intimate partner violence    Fear of current or ex partner: Not on file    Emotionally abused: Not on file    Physically abused: Not on file    Forced sexual activity: Not on file  Other Topics Concern  . Not on file  Social History Narrative  . Not on file  FAMILY HISTORY:  Family History  Problem Relation Age of Onset  . Epilepsy Mother   . Epilepsy Brother   . Lupus Brother     CURRENT MEDICATIONS:  Outpatient Encounter Medications as of 03/04/2019  Medication Sig Note  . fexofenadine (ALLEGRA) 180 MG tablet Take 180 mg by mouth as needed.    Marland Kitchen FLUoxetine (PROZAC) 20 MG capsule Take 60 mg by mouth daily.   Marland Kitchen lamoTRIgine (LAMICTAL) 100 MG tablet Take 100 mg by mouth daily.   Marland Kitchen levothyroxine (SYNTHROID) 75 MCG tablet TAKE 1 TABLET BY MOUTH ONCE DAILY BEFORE BREAKFAST   . methocarbamol (ROBAXIN) 500 MG tablet Take 500 mg by mouth 2 (two) times daily as needed.   . Multiple Vitamin (MULTIVITAMIN) tablet Take 1 tablet by mouth daily.   Marland Kitchen omeprazole (PRILOSEC) 40 MG capsule Take 1 capsule (40  mg total) by mouth daily. (Patient taking differently: Take 40 mg by mouth daily as needed. )   . ergocalciferol (VITAMIN D2) 1.25 MG (50000 UT) capsule Take 1 capsule (50,000 Units total) by mouth once a week.   . fluconazole (DIFLUCAN) 150 MG tablet Take 150 mg by mouth every 30 (thirty) days.  03/04/2019: Every 30 days as needed  . [DISCONTINUED] Cholecalciferol (VITAMIN D) 125 MCG (5000 UT) CAPS Take by mouth daily.    No facility-administered encounter medications on file as of 03/04/2019.     ALLERGIES:  Allergies  Allergen Reactions  . Penicillins Rash  . Casein Other (See Comments)    G.I. Upset  . Augmentin [Amoxicillin-Pot Clavulanate] Swelling and Rash     PHYSICAL EXAM:  ECOG Performance status: 1  Vitals:   03/04/19 1352  BP: 133/82  Pulse: 90  Resp: 20  Temp: (!) 97.1 F (36.2 C)  SpO2: 99%   Filed Weights   03/04/19 1352  Weight: 271 lb 6.4 oz (123.1 kg)    Physical Exam Constitutional:      Appearance: Normal appearance. She is normal weight.  Cardiovascular:     Rate and Rhythm: Normal rate and regular rhythm.     Heart sounds: Normal heart sounds.  Pulmonary:     Effort: Pulmonary effort is normal.     Breath sounds: Normal breath sounds.  Abdominal:     General: Bowel sounds are normal.     Palpations: Abdomen is soft.  Musculoskeletal: Normal range of motion.  Skin:    General: Skin is warm and dry.  Neurological:     Mental Status: She is alert and oriented to person, place, and time. Mental status is at baseline.  Psychiatric:        Mood and Affect: Mood normal.        Behavior: Behavior normal.        Thought Content: Thought content normal.        Judgment: Judgment normal.      LABORATORY DATA:  I have reviewed the labs as listed.  CBC    Component Value Date/Time   WBC 11.4 (H) 03/02/2019 1335   RBC 4.38 03/02/2019 1335   HGB 12.7 03/02/2019 1335   HCT 39.8 03/02/2019 1335   PLT 393 03/02/2019 1335   MCV 90.9 03/02/2019  1335   MCH 29.0 03/02/2019 1335   MCHC 31.9 03/02/2019 1335   RDW 14.2 03/02/2019 1335   LYMPHSABS 2.1 03/02/2019 1335   MONOABS 0.4 03/02/2019 1335   EOSABS 0.5 03/02/2019 1335   BASOSABS 0.1 03/02/2019 1335   CMP Latest Ref Rng & Units 03/02/2019  08/19/2018 02/14/2018  Glucose 70 - 99 mg/dL 107(H) 99 107(H)  BUN 6 - 20 mg/dL 12 7 10   Creatinine 0.44 - 1.00 mg/dL 0.60 0.63 0.67  Sodium 135 - 145 mmol/L 139 140 137  Potassium 3.5 - 5.1 mmol/L 3.8 4.0 3.7  Chloride 98 - 111 mmol/L 106 106 106  CO2 22 - 32 mmol/L 24 24 23   Calcium 8.9 - 10.3 mg/dL 8.9 8.7(L) 8.8(L)  Total Protein 6.5 - 8.1 g/dL 7.6 7.4 7.6  Total Bilirubin 0.3 - 1.2 mg/dL 0.2(L) 0.4 0.4  Alkaline Phos 38 - 126 U/L 78 72 79  AST 15 - 41 U/L 20 33 29  ALT 0 - 44 U/L 29 43 41    I personally performed a face-to-face visit.  All questions were answered to patient's stated satisfaction. Encouraged patient to call with any new concerns or questions before his next visit to the cancer center and we can certain see him sooner, if needed.     ASSESSMENT & PLAN:   Iron deficiency anemia 1.  Iron deficiency anemia: - Patient reports she does have occasional bleeding per rectum. - Patient requires intermittent iron infusions last Feraheme was on 08/29/2018. 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 -Labs on 03/02/2019 showed potassium 3.8, creatinine 0.60, ferritin 235, percent saturation 13, hemoglobin 12.7, WBC 11.4, platelets 393 -Due to her extreme fatigue we will order 2 more iron infusions. -We will see her back in 4 months with repeat labs.  2.  Severe fatigue: - Patient reports she has a family history of lupus and wishes to be tested. -She also has tiny scabs sores all over her body. - We will order labs today and we will call her with the results.  3.  Vitamin D deficiency: -Labs done on 03/02/2019 showed her vitamin D level at 21.3. -She is taking  vitamin D twice daily this is not helping her levels. -I have prescribed vitamin D 50,000 units once weekly -We will recheck her labs in 4 months.      Orders placed this encounter:  Orders Placed This Encounter  Procedures  . ANA, IFA (with reflex)  . Rheumatoid factor  . Sedimentation rate  . C-reactive protein  . Lactate dehydrogenase  . CBC with Differential/Platelet  . Comprehensive metabolic panel  . Ferritin  . Iron and TIBC  . Vitamin B12  . VITAMIN D 25 Hydroxy (Vit-D Deficiency, Fractures)      Francene Finders, FNP-C [place] [phone]

## 2019-03-04 NOTE — Patient Instructions (Signed)
[facility] at [facility] Discharge Instructions     Thank you for choosing [facility] at [facility] to provide your oncology and hematology care.  To afford each patient quality time with our provider, please arrive at least 15 minutes before your scheduled appointment time.   If you have a lab appointment with the Cancer Center please come in thru the Main Entrance and check in at the main information desk.  You need to re-schedule your appointment should you arrive 10 or more minutes late.  We strive to give you quality time with our providers, and arriving late affects you and other patients whose appointments are after yours.  Also, if you no show three or more times for appointments you may be dismissed from the clinic at the providers discretion.     Again, thank you for choosing [facility].  Our hope is that these requests will decrease the amount of time that you wait before being seen by our physicians.       _____________________________________________________________  Should you have questions after your visit to [facility], please contact our office at [phone] between the hours of 8:00 a.m. and 4:30 p.m.  Voicemails left after 4:00 p.m. will not be returned until the following business day.  For prescription refill requests, have your pharmacy contact our office and allow 72 hours.    Due to Covid, you will need to wear a mask upon entering the hospital. If you do not have a mask, a mask will be given to you at the Main Entrance upon arrival. For doctor visits, patients may have 1 support person with them. For treatment visits, patients can not have anyone with them due to social distancing guidelines and our immunocompromised population.      

## 2019-03-04 NOTE — Assessment & Plan Note (Addendum)
1.  Iron deficiency anemia: - Patient reports she does have occasional bleeding per rectum. - Patient requires intermittent iron infusions last Feraheme was on 08/29/2018. 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 -Labs on 03/02/2019 showed potassium 3.8, creatinine 0.60, ferritin 235, percent saturation 13, hemoglobin 12.7, WBC 11.4, platelets 393 -Due to her extreme fatigue we will order 2 more iron infusions. -We will see her back in 4 months with repeat labs.  2.  Severe fatigue: - Patient reports she has a family history of lupus and wishes to be tested. -She also has tiny scabs sores all over her body. - We will order labs today and we will call her with the results.  3.  Vitamin D deficiency: -Labs done on 03/02/2019 showed her vitamin D level at 21.3. -She is taking vitamin D twice daily this is not helping her levels. -I have prescribed vitamin D 50,000 units once weekly -We will recheck her labs in 4 months.

## 2019-03-05 LAB — RHEUMATOID FACTOR: Rheumatoid fact SerPl-aCnc: <10 IU/mL (ref 0.0–13.9)

## 2019-03-05 LAB — ANTINUCLEAR ANTIBODIES, IFA: ANA Ab, IFA: NEGATIVE

## 2019-03-06 ENCOUNTER — Encounter (HOSPITAL_COMMUNITY): Payer: Self-pay | Admitting: Nurse Practitioner

## 2019-03-10 DIAGNOSIS — D500 Iron deficiency anemia secondary to blood loss (chronic): Secondary | ICD-10-CM | POA: Insufficient documentation

## 2019-03-13 ENCOUNTER — Other Ambulatory Visit: Payer: Self-pay

## 2019-03-13 ENCOUNTER — Encounter (HOSPITAL_COMMUNITY): Payer: Self-pay

## 2019-03-13 ENCOUNTER — Inpatient Hospital Stay (HOSPITAL_COMMUNITY): Payer: BC Managed Care – PPO | Attending: Hematology

## 2019-03-13 VITALS — BP 125/70 | HR 79 | Temp 97.7°F | Resp 18

## 2019-03-13 DIAGNOSIS — Z79899 Other long term (current) drug therapy: Secondary | ICD-10-CM | POA: Diagnosis not present

## 2019-03-13 DIAGNOSIS — D500 Iron deficiency anemia secondary to blood loss (chronic): Secondary | ICD-10-CM

## 2019-03-13 DIAGNOSIS — D509 Iron deficiency anemia, unspecified: Secondary | ICD-10-CM | POA: Diagnosis not present

## 2019-03-13 MED ORDER — SODIUM CHLORIDE 0.9 % IV SOLN
510.0000 mg | Freq: Once | INTRAVENOUS | Status: AC
  Administered 2019-03-13: 510 mg via INTRAVENOUS
  Filled 2019-03-13: qty 17

## 2019-03-13 MED ORDER — SODIUM CHLORIDE 0.9 % IV SOLN
Freq: Once | INTRAVENOUS | Status: AC
  Administered 2019-03-13: 08:00:00 via INTRAVENOUS

## 2019-03-13 NOTE — Progress Notes (Signed)
[name] tolerated Feraheme infusion well without complaints or incident. VSS upon discharge. Peripheral IV site checked with positive blood return noted prior to and after infusion. Pt discharged self ambulatory in satisfactory condition

## 2019-03-13 NOTE — Patient Instructions (Signed)
[place] at [facility] Discharge Instructions  Received Feraheme infusion today. Follow-up as scheduled. Call clinic for any questions or concerns   Thank you for choosing [place] at [facility] to provide your oncology and hematology care.  To afford each patient quality time with our provider, please arrive at least 15 minutes before your scheduled appointment time.   If you have a lab appointment with the [facility] please come in thru the Main Entrance and check in at the main information desk.  You need to re-schedule your appointment should you arrive 10 or more minutes late.  We strive to give you quality time with our providers, and arriving late affects you and other patients whose appointments are after yours.  Also, if you no show three or more times for appointments you may be dismissed from the clinic at the providers discretion.     Again, thank you for choosing [facility].  Our hope is that these requests will decrease the amount of time that you wait before being seen by our physicians.       _____________________________________________________________  Should you have questions after your visit to [facility], please contact our office at [phone] between the hours of 8:00 a.m. and 4:30 p.m.  Voicemails left after 4:00 p.m. will not be returned until the following business day.  For prescription refill requests, have your pharmacy contact our office and allow 72 hours.    Due to Covid, you will need to wear a mask upon entering the hospital. If you do not have a mask, a mask will be given to you at the Main Entrance upon arrival. For doctor visits, patients may have 1 support person with them. For treatment visits, patients can not have anyone with them due to social distancing guidelines and our immunocompromised population.

## 2019-03-23 ENCOUNTER — Inpatient Hospital Stay (HOSPITAL_COMMUNITY): Payer: BC Managed Care – PPO

## 2019-03-23 ENCOUNTER — Encounter (HOSPITAL_COMMUNITY): Payer: Self-pay

## 2019-03-23 ENCOUNTER — Other Ambulatory Visit: Payer: Self-pay

## 2019-03-23 VITALS — BP 90/75 | HR 88 | Temp 97.6°F | Resp 18

## 2019-03-23 DIAGNOSIS — D509 Iron deficiency anemia, unspecified: Secondary | ICD-10-CM | POA: Diagnosis not present

## 2019-03-23 DIAGNOSIS — D500 Iron deficiency anemia secondary to blood loss (chronic): Secondary | ICD-10-CM

## 2019-03-23 DIAGNOSIS — Z79899 Other long term (current) drug therapy: Secondary | ICD-10-CM | POA: Diagnosis not present

## 2019-03-23 MED ORDER — SODIUM CHLORIDE 0.9 % IV SOLN
Freq: Once | INTRAVENOUS | Status: AC
  Administered 2019-03-23: 13:00:00 via INTRAVENOUS

## 2019-03-23 MED ORDER — SODIUM CHLORIDE 0.9 % IV SOLN
510.0000 mg | Freq: Once | INTRAVENOUS | Status: AC
  Administered 2019-03-23: 14:00:00 510 mg via INTRAVENOUS
  Filled 2019-03-23: qty 510

## 2019-03-23 NOTE — Progress Notes (Signed)
Feraheme given per orders. Patient tolerated it well without problems. Vitals stable and discharged home from clinic ambulatory. Follow up as scheduled.

## 2019-03-23 NOTE — Patient Instructions (Signed)
[place] at [facility]  Discharge Instructions:  feraheme today.  _______________________________________________________________  Thank you for choosing [place] at [facility] to provide your oncology and hematology care.  To afford each patient quality time with our providers, please arrive at least 15 minutes before your scheduled appointment.  You need to re-schedule your appointment if you arrive 10 or more minutes late.  We strive to give you quality time with our providers, and arriving late affects you and other patients whose appointments are after yours.  Also, if you no show three or more times for appointments you may be dismissed from the clinic.  Again, thank you for choosing [place] at [place] hope is that these requests will allow you access to exceptional care and in a timely manner. _______________________________________________________________  If you have questions after your visit, please contact our office at [phone] between the hours of 8:30 a.m. and 5:00 p.m. Voicemails left after 4:30 p.m. will not be returned until the following business day. _______________________________________________________________  For prescription refill requests, have your pharmacy contact our office. _______________________________________________________________  Recommendations made by the consultant and any test results will be sent to your referring physician. _______________________________________________________________

## 2019-03-25 ENCOUNTER — Ambulatory Visit (INDEPENDENT_AMBULATORY_CARE_PROVIDER_SITE_OTHER): Payer: BC Managed Care – PPO | Admitting: Rheumatology

## 2019-03-25 ENCOUNTER — Encounter: Payer: Self-pay | Admitting: Rheumatology

## 2019-03-25 ENCOUNTER — Other Ambulatory Visit: Payer: Self-pay

## 2019-03-25 VITALS — BP 133/85 | HR 93 | Resp 15 | Ht 67.0 in | Wt 272.0 lb

## 2019-03-25 DIAGNOSIS — Z8669 Personal history of other diseases of the nervous system and sense organs: Secondary | ICD-10-CM

## 2019-03-25 DIAGNOSIS — M2550 Pain in unspecified joint: Secondary | ICD-10-CM | POA: Diagnosis not present

## 2019-03-25 DIAGNOSIS — E038 Other specified hypothyroidism: Secondary | ICD-10-CM

## 2019-03-25 DIAGNOSIS — R700 Elevated erythrocyte sedimentation rate: Secondary | ICD-10-CM | POA: Diagnosis not present

## 2019-03-25 DIAGNOSIS — E042 Nontoxic multinodular goiter: Secondary | ICD-10-CM

## 2019-03-25 DIAGNOSIS — M791 Myalgia, unspecified site: Secondary | ICD-10-CM

## 2019-03-25 DIAGNOSIS — E063 Autoimmune thyroiditis: Secondary | ICD-10-CM

## 2019-03-25 DIAGNOSIS — F5101 Primary insomnia: Secondary | ICD-10-CM

## 2019-03-25 DIAGNOSIS — D500 Iron deficiency anemia secondary to blood loss (chronic): Secondary | ICD-10-CM

## 2019-03-25 DIAGNOSIS — L739 Follicular disorder, unspecified: Secondary | ICD-10-CM

## 2019-03-25 DIAGNOSIS — K9041 Non-celiac gluten sensitivity: Secondary | ICD-10-CM | POA: Diagnosis not present

## 2019-03-25 DIAGNOSIS — Z8719 Personal history of other diseases of the digestive system: Secondary | ICD-10-CM

## 2019-03-25 DIAGNOSIS — Z8659 Personal history of other mental and behavioral disorders: Secondary | ICD-10-CM

## 2019-03-25 DIAGNOSIS — D270 Benign neoplasm of right ovary: Secondary | ICD-10-CM

## 2019-03-25 DIAGNOSIS — E559 Vitamin D deficiency, unspecified: Secondary | ICD-10-CM

## 2019-03-25 DIAGNOSIS — R5382 Chronic fatigue, unspecified: Secondary | ICD-10-CM

## 2019-03-25 NOTE — Progress Notes (Signed)
Office Visit Note  Patient: Maureen Yates             Date of Birth: 18-May-1981           MRN: [id number]             PCP: [name], MD Referring: Glennie [place], NP-C Visit Date: 03/25/2019 Occupation: Hairdresser  Subjective:  Pain in joints and muscles.   History of Present Illness: Maureen Yates is a 38 y.o. female seen in consultation per request of hematology.  According to patient her symptoms are started in 2013 with increased fatigue.  She has been on iron infusions about 3 infusions/year by hematology since then.  She states she was also diagnosed with non-celiac gluten sensitivity and due to that she has poor absorption of all the vitamins.  She has been experiencing joint pain for the last 5 years.  She states she has pain in her long bones which is deep aching pain.  It involves her upper and lower extremities.  The pain has been worse in the last 1-1/2-year.  She states in June 2019 she developed a rash on her torso and extremities which was seen by dermatologist and was diagnosed as folliculitis.  She was treated with Ketoconazole but did not have a response.  She was also given doxycycline and it did not respond to that either.  She developed shingles around her left eye for which she received acyclovir.  She states she noticed improvement in her skin lesions.  Later her primary care physician tried p.o. acyclovir for 3 weeks without any clearance of her skin lesions.  She has not seen dermatologist since then.  Patient denies having a skin biopsy.  She states recently she was seen by the hematologist to was concerned about her skin lesions and joint pain and ran some lab work and referred her to me for further evaluation.  She experiences muscle pain as well which is usually after her work day.  Activities of Daily Living:  Patient reports morning stiffness for 24 hours.   Patient Reports nocturnal pain.  Difficulty dressing/grooming: Denies Difficulty  climbing stairs: Reports Difficulty getting out of chair: Denies Difficulty using hands for taps, buttons, cutlery, and/or writing: Denies  Review of Systems  Constitutional: Positive for fatigue. Negative for night sweats, weight gain and weight loss.  HENT: Negative for mouth sores, trouble swallowing, trouble swallowing, mouth dryness and nose dryness.   Eyes: Negative for pain, redness, itching, visual disturbance and dryness.  Respiratory: Negative for cough, shortness of breath, wheezing and difficulty breathing.   Cardiovascular: Negative for chest pain, palpitations, hypertension, irregular heartbeat and swelling in legs/feet.  Gastrointestinal: Positive for diarrhea. Negative for blood in stool and constipation.  Endocrine: Negative for increased urination.  Genitourinary: Negative for difficulty urinating, painful urination and vaginal dryness.  Musculoskeletal: Positive for arthralgias, joint pain and morning stiffness. Negative for joint swelling, myalgias, muscle weakness, muscle tenderness and myalgias.  Skin: Positive for color change and rash. Negative for hair loss, skin tightness, ulcers and sensitivity to sunlight.  Allergic/Immunologic: Negative for susceptible to infections.  Neurological: Positive for weakness. Negative for dizziness, light-headedness, headaches, memory loss and night sweats.  Hematological: Negative for bruising/bleeding tendency and swollen glands.  Psychiatric/Behavioral: Positive for sleep disturbance. Negative for depressed mood and confusion. The patient is nervous/anxious.     PMFS History:  Patient Active Problem List   Diagnosis Date Noted  . Iron deficiency anemia due to chronic blood loss 03/10/2019  .  Abdominal pain, epigastric 03/19/2018  . Diarrhea 03/19/2018  . Hypothyroidism due to Hashimoto's thyroiditis 03/04/2018  . Hashimoto's thyroiditis 09/02/2017  . Multinodular goiter 11/02/2016  . Class 3 severe obesity due to excess  calories without serious comorbidity with body mass index (BMI) of 40.0 to 44.9 in adult ([place]) 11/02/2016  . Leukocytosis 07/13/2016  . Esophageal reflux 02/23/2016  . Encounter for other specified aftercare 12/21/2015  . Migraine with aura 12/21/2015  . Vitamin D deficiency 05/13/2015  . Thoracic or lumbosacral neuritis or radiculitis 11/22/2014  . Iron deficiency anemia 08/20/2014  . Anxiety state 08/20/2014  . Ectopic pregnancy, 03/26/2014 05/13/2014  . Celiac disease 07/10/2013  . Cystadenoma of right ovary 07/10/2013  . Hair loss 06/30/2013  . IBS (irritable bowel syndrome) 04/03/2012  . Seasonal allergies 01/31/2012  . Anemia 01/31/2012    Past Medical History:  Diagnosis Date  . Anxiety   . Environmental allergies   . GERD (gastroesophageal reflux disease)   . IBS (irritable bowel syndrome)   . Tubal ectopic pregnancy     Family History  Problem Relation Age of Onset  . Epilepsy Mother   . Epilepsy Brother   . Lupus Brother   . Crohn's disease Father   . COPD Father   . Healthy Son   . Allergy (severe) Son   . Healthy Daughter    Past Surgical History:  Procedure Laterality Date  . CHOLECYSTECTOMY  Sept of 2012 gallstones  . COLONOSCOPY  02/27/2012   Procedure: COLONOSCOPY;  Surgeon: Rogene Houston, MD;  Location[facility];  Service: Endoscopy;  Laterality: N/A;  225  . eptopic pregnancy  2015  . rt ovary      removed 03/2010 for 4 pound tumor  . TUBAL LIGATION Bilateral 04/2013   Social History   Social History Narrative  . Not on file    There is no immunization history on file for this patient.   Objective: Vital Signs: BP 133/85 (BP Location: Right Wrist, Patient Position: Sitting, Cuff Size: Normal)   Pulse 93   Resp 15   Ht 5' 7"  (1.702 m)   Wt 272 lb (123.4 kg)   BMI 42.60 kg/m    Physical Exam Vitals signs and nursing note reviewed.  Constitutional:      Appearance: She is well-developed.  HENT:     Head: Normocephalic and  atraumatic.  Eyes:     Conjunctiva/sclera: Conjunctivae normal.  Neck:     Musculoskeletal: Normal range of motion.  Cardiovascular:     Rate and Rhythm: Normal rate and regular rhythm.     Heart sounds: Normal heart sounds.  Pulmonary:     Effort: Pulmonary effort is normal.     Breath sounds: Normal breath sounds.  Abdominal:     General: Bowel sounds are normal.     Palpations: Abdomen is soft.  Lymphadenopathy:     Cervical: No cervical adenopathy.  Skin:    General: Skin is warm and dry.     Capillary Refill: Capillary refill takes less than 2 seconds.     Comments: Patient had folliculitis on her extremities and trunk.  Neurological:     Mental Status: She is alert and oriented to person, place, and time.  Psychiatric:        Behavior: Behavior normal.      Musculoskeletal Exam: C-spine, thoracic and lumbar spine were in good range of motion.  She had no SI joint tenderness.  Shoulder joints, elbow joints, wrist joints, MCPs  PIPs and DIPs with good range of motion with no synovitis.  Hip joints, knee joints, ankles MTPs PIPs DIPs with good range of motion with no synovitis.  She had some generalized hyperalgesia.  CDAI Exam: CDAI Score: - Patient Global: -; Provider Global: - Swollen: -; Tender: - Joint Exam   No joint exam has been documented for this visit   There is currently no information documented on the homunculus. Go to the Rheumatology activity and complete the homunculus joint exam.  Investigation: Findings:  03/04/19: ANA negative, RF<10, sed rate 32, CRP 1.8  Component     Latest Ref Rng & Units 03/04/2019  CRP     <1.0 mg/dL 1.8 (H)  Sed Rate     0 - 22 mm/hr 32 (H)  RA Latex Turbid.     0.0 - 13.9 IU/mL <10.0  ANA Ab, IFA      Negative   Imaging: No results found.  Recent Labs: Lab Results  Component Value Date   WBC 11.4 (H) 03/02/2019   HGB 12.7 03/02/2019   PLT 393 03/02/2019   NA 139 03/02/2019   K 3.8 03/02/2019   CL 106  03/02/2019   CO2 24 03/02/2019   GLUCOSE 107 (H) 03/02/2019   BUN 12 03/02/2019   CREATININE 0.60 03/02/2019   BILITOT 0.2 (L) 03/02/2019   ALKPHOS 78 03/02/2019   AST 20 03/02/2019   ALT 29 03/02/2019   PROT 7.6 03/02/2019   ALBUMIN 4.2 03/02/2019   CALCIUM 8.9 03/02/2019   GFRAA >60 03/02/2019    Speciality Comments: No specialty comments available.  Procedures:  No procedures performed Allergies: Penicillins, Casein, and Augmentin [amoxicillin-pot clavulanate]   Assessment / Plan:     Visit Diagnoses: Polyarthralgia-patient complains of pain in her multiple joints and her bones for the last 5 years.  She states the symptoms have been getting worse in the last 1 year.  No synovitis was noted on examination.  All her autoimmune work-up has been negative so far.  I have advised her to contact me in case she develops any joint swelling.  Myalgia-she also complains of achiness in her muscles.  She had no muscular weakness on examination.  I believe she has underlying myofascial pain syndrome.  She states she has positive family history of fibromyalgia syndrome in her mother.  Detailed counseling was provided.  Good sleep hygiene was discussed.  Need for regular exercise was discussed.  Weight loss was discussed.  Her BMI is 42.6.  I have given her a handout on [place] weight management program.  She may benefit from the use of Cymbalta.  I have advised her to discuss that further with Dr. Pleas Koch.  She also discussed possible use of gabapentin.  I am concerned about the weight gain side effect.  Use of water aerobics and swimming was discussed.  I also offered physical therapy which she declined due to her busy schedule.  Elevated sed rate - 03/04/19: ANA negative, RF<10, sed rate 32, CRP 1.8.  Her sed rate is only mildly elevated.  She had no clinical features of autoimmune disease on examination.  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  Chronic  fatigue-appears to be related to insomnia.  Primary insomnia-good sleep hygiene was discussed.  Iron deficiency anemia due to chronic blood loss-patient gets iron infusions about 3 times per year.  Vitamin D deficiency-she is on vitamin D supplement.  Her vitamin D is still low.  I detailed discussion with her  about vitamin D deficiency and associated arthralgias and myalgias.  Folliculitis-patient recalls being on doxycycline about a year ago.  She did not respond to it.  She may benefit from Keflex.  Have advised her to discuss this further with the dermatologist.  Hypothyroidism due to Hashimoto's thyroiditis  Multinodular goiter  Cystadenoma of right ovary  History of IBS  Hx of migraines  History of gastroesophageal reflux (GERD)  History of anxiety  Orders: No orders of the defined types were placed in this encounter.  No orders of the defined types were placed in this encounter.   Face-to-face time spent with patient was 50 minutes. Greater than 50% of time was spent in counseling and coordination of care.  Follow-Up Instructions: Return if symptoms worsen or fail to improve.   Bo Merino, MD  Note - This record has been created using Editor, commissioning.  Chart creation errors have been sought, but may not always  have been located. Such creation errors do not reflect on  the standard of medical care.

## 2019-03-26 DIAGNOSIS — E6609 Other obesity due to excess calories: Secondary | ICD-10-CM | POA: Diagnosis not present

## 2019-03-26 DIAGNOSIS — F419 Anxiety disorder, unspecified: Secondary | ICD-10-CM | POA: Diagnosis not present

## 2019-03-26 DIAGNOSIS — R5383 Other fatigue: Secondary | ICD-10-CM | POA: Diagnosis not present

## 2019-03-26 DIAGNOSIS — Z6841 Body Mass Index (BMI) 40.0 and over, adult: Secondary | ICD-10-CM | POA: Diagnosis not present

## 2019-03-26 DIAGNOSIS — D509 Iron deficiency anemia, unspecified: Secondary | ICD-10-CM | POA: Diagnosis not present

## 2019-04-14 DIAGNOSIS — R5383 Other fatigue: Secondary | ICD-10-CM | POA: Diagnosis not present

## 2019-04-14 DIAGNOSIS — D509 Iron deficiency anemia, unspecified: Secondary | ICD-10-CM | POA: Diagnosis not present

## 2019-04-14 DIAGNOSIS — F419 Anxiety disorder, unspecified: Secondary | ICD-10-CM | POA: Diagnosis not present

## 2019-04-14 DIAGNOSIS — E6609 Other obesity due to excess calories: Secondary | ICD-10-CM | POA: Diagnosis not present

## 2019-04-16 ENCOUNTER — Ambulatory Visit: Payer: BC Managed Care – PPO | Admitting: Rheumatology

## 2019-04-23 DIAGNOSIS — L738 Other specified follicular disorders: Secondary | ICD-10-CM | POA: Diagnosis not present

## 2019-05-12 ENCOUNTER — Ambulatory Visit: Payer: BC Managed Care – PPO | Admitting: Rheumatology

## 2019-05-26 DIAGNOSIS — D509 Iron deficiency anemia, unspecified: Secondary | ICD-10-CM | POA: Diagnosis not present

## 2019-05-26 DIAGNOSIS — F419 Anxiety disorder, unspecified: Secondary | ICD-10-CM | POA: Diagnosis not present

## 2019-05-26 DIAGNOSIS — R5383 Other fatigue: Secondary | ICD-10-CM | POA: Diagnosis not present

## 2019-05-26 DIAGNOSIS — E6609 Other obesity due to excess calories: Secondary | ICD-10-CM | POA: Diagnosis not present

## 2019-05-26 DIAGNOSIS — M26601 Right temporomandibular joint disorder, unspecified: Secondary | ICD-10-CM | POA: Diagnosis not present

## 2019-05-26 DIAGNOSIS — M7918 Myalgia, other site: Secondary | ICD-10-CM | POA: Diagnosis not present

## 2019-06-01 DIAGNOSIS — E063 Autoimmune thyroiditis: Secondary | ICD-10-CM | POA: Diagnosis not present

## 2019-06-01 DIAGNOSIS — E038 Other specified hypothyroidism: Secondary | ICD-10-CM | POA: Diagnosis not present

## 2019-06-02 LAB — T4, FREE: Free T4: 1.1 ng/dL (ref 0.8–1.8)

## 2019-06-02 LAB — TSH: TSH: 1.29 mIU/L

## 2019-06-08 ENCOUNTER — Other Ambulatory Visit: Payer: Self-pay

## 2019-06-08 ENCOUNTER — Encounter: Payer: Self-pay | Admitting: "Endocrinology

## 2019-06-08 ENCOUNTER — Ambulatory Visit (INDEPENDENT_AMBULATORY_CARE_PROVIDER_SITE_OTHER): Payer: BC Managed Care – PPO | Admitting: "Endocrinology

## 2019-06-08 DIAGNOSIS — E038 Other specified hypothyroidism: Secondary | ICD-10-CM

## 2019-06-08 DIAGNOSIS — E063 Autoimmune thyroiditis: Secondary | ICD-10-CM | POA: Diagnosis not present

## 2019-06-08 MED ORDER — LEVOTHYROXINE SODIUM 88 MCG PO TABS: ORAL_TABLET | ORAL | 1 refills | Status: DC

## 2019-06-08 NOTE — Progress Notes (Signed)
06/08/2019                                Endocrinology Telehealth Visit Follow up Note -During COVID -19 Pandemic  I connected with Maureen Yates on 06/08/2019   by telephone and verified that I am speaking with the correct person using two identifiers. Maureen Yates, 08-Jul-1981. she has verbally consented to this visit. All issues noted in this document were discussed and addressed. The format was not optimal for physical exam.    Subjective:    Patient ID: Maureen Yates, female    DOB: 03-25-1981, PCP Burdine, Virgina Evener, MD   Past Medical History:  Diagnosis Date  . Anxiety   . Environmental allergies   . GERD (gastroesophageal reflux disease)   . IBS (irritable bowel syndrome)   . Tubal ectopic pregnancy    Past Surgical History:  Procedure Laterality Date  . CHOLECYSTECTOMY  Sept of 2012 gallstones  . COLONOSCOPY  02/27/2012   Procedure: COLONOSCOPY;  Surgeon: Rogene Houston, MD;  [facility];  Service: Endoscopy;  Laterality: N/A;  225  . eptopic pregnancy  2015  . rt ovary      removed 03/2010 for 4 pound tumor  . TUBAL LIGATION Bilateral 04/2013   Social History   Socioeconomic History  . Marital status: Married    Spouse name: Not on file  . Number of children: Not on file  . Years of education: Not on file  . Highest education level: Not on file  Occupational History  . Not on file  Social Needs  . Financial resource strain: Not on file  . Food insecurity    Worry: Not on file    Inability: Not on file  . Transportation needs    Medical: Not on file    Non-medical: Not on file  Tobacco Use  . Smoking status: Never Smoker  . Smokeless tobacco: Never Used  Substance and Sexual Activity  . Alcohol use: No  . Drug use: No  . Sexual activity: Yes  Lifestyle  . Physical activity    Days per week: Not on file    Minutes per session: Not on file  . Stress: Not on file  Relationships  . Social Herbalist on phone: Not on  file    Gets together: Not on file    Attends religious service: Not on file    Active member of club or organization: Not on file    Attends meetings of clubs or organizations: Not on file    Relationship status: Not on file  Other Topics Concern  . Not on file  Social History Narrative  . Not on file   Outpatient Encounter Medications as of 06/08/2019  Medication Sig  . acyclovir (ZOVIRAX) 400 MG tablet TAKE 1 TABLET (400 MG TOTAL) BY MOUTH TWO (2) TIMES A DAY.  . ergocalciferol (VITAMIN D2) 1.25 MG (50000 UT) capsule Take 1 capsule (50,000 Units total) by mouth once a week.  . fexofenadine (ALLEGRA) 180 MG tablet Take 180 mg by mouth as needed.   . fluconazole (DIFLUCAN) 150 MG tablet Take 150 mg by mouth every 30 (thirty) days.   Marland Kitchen FLUoxetine (PROZAC) 20 MG capsule Take 60 mg by mouth daily.  Marland Kitchen lamoTRIgine (LAMICTAL) 100 MG tablet Take 100 mg by mouth daily.  Marland Kitchen levothyroxine (SYNTHROID) 88 MCG tablet TAKE 1 TABLET BY MOUTH ONCE DAILY BEFORE  BREAKFAST  . methocarbamol (ROBAXIN) 500 MG tablet Take 500 mg by mouth 2 (two) times daily as needed.  . Multiple Vitamin (MULTIVITAMIN) tablet Take 1 tablet by mouth daily.  Marland Kitchen omeprazole (PRILOSEC) 40 MG capsule Take 1 capsule (40 mg total) by mouth daily. (Patient taking differently: Take 40 mg by mouth daily as needed. )  . [DISCONTINUED] levothyroxine (SYNTHROID) 75 MCG tablet TAKE 1 TABLET BY MOUTH ONCE DAILY BEFORE BREAKFAST   No facility-administered encounter medications on file as of 06/08/2019.    ALLERGIES: Allergies  Allergen Reactions  . Penicillins Rash  . Casein Other (See Comments)    G.I. Upset  . Augmentin [Amoxicillin-Pot Clavulanate] Swelling and Rash    VACCINATION STATUS:  There is no immunization history on file for this patient.  HPI  38 year old female patient with medical history as above. She is being seen in Follow-up for hypothyroidism, vitamin D deficiency, and multinodular goiter. -She has lab evidence  of  celiac disease not on treatment.  She is on treatment for anemia. -  She has family history of Graves' disease in one of her grandparents. -She is currently on levothyroxine 25 mcg p.o. daily before breakfast.  She reports compliance to her medications.  She has no new complaints today.    -She denies heat intolerance nor cold intolerance. She denies any dysphagia, shortness of breath, nor voice change. -She is on antianxiety medications including Prozac and Lamictal.   Review of Systems  Limited as above.  Objective:    There were no vitals taken for this visit.  Wt Readings from Last 3 Encounters:  03/25/19 272 lb (123.4 kg)  03/04/19 271 lb 6.4 oz (123.1 kg)  12/02/18 269 lb (122 kg)        Recent Results (from the past 2160 hour(s))  TSH     Status: None   Collection Time: 06/01/19 10:10 AM  Result Value Ref Range   TSH 1.29 mIU/L    Comment:           Reference Range .           > or = 20 Years  0.40-4.50 .                Pregnancy Ranges           First trimester    0.26-2.66           Second trimester   0.55-2.73           Third trimester    0.43-2.91   T4, free     Status: None   Collection Time: 06/01/19 10:10 AM  Result Value Ref Range   Free T4 1.1 0.8 - 1.8 ng/dL       October 17, 2016 lab work showed TPO antibodies normal at 14, thyroglobulin antibodies <1, free T4 1.6, TSH 1.83, free T3 3.0  Thyroid sonograms from 09/24/2016 showed right lobe 4.4 cm left lobe 4.8 cm. There are multiple punctate/partially cystic nodules, largest 0.6 cm on the left lobe, and 0.4 cm on the right lobe.  Thyroid ultrasound from 03/13/2017, right lobe 5 cm with 0.6 cm nodule, left lobe 4.8 cm with 0.5 cm stable nodule.  Her thyroid ultrasound on May 13, 2018 showed right lobe 4.9 cm, previously 5 cm; left lobe 4.8 cm unchanged from prior study in September 2018.  0.7 cm nodule on the right lobe which does not meet any imaging criteria for biopsy.  0.6 cm left lobe  nodule also  does not meet criteria for biopsy.   Assessment & Plan:   1.  Hypothyroidism 2.  Multinodular  Goiter 3.  Vitamin D deficiency -Her previsit thyroid function tests are consistent with appropriate replacement, however, she would benefit from slight increase in her levothyroxine dose.  I discussed and increase her levothyroxine to 88 mcg p.o. daily before breakfast.   Time for this visit: 15 minutes. Maureen Yates  participated in the discussions, expressed understanding, and voiced agreement with the above plans.  All questions were answered to her satisfaction. she is encouraged to contact clinic should she have any questions or concerns prior to her return visit.   -  she does have multinodular goiter- which has been stable over more than 18 months.  - She will not require immediate intervention at this time, she does not require fine-needle aspiration of the small nodules at this time.  She will be considered for repeat thyroid ultrasound in 1 year.  Regarding her vitamin D deficiency: It remains low normal at 25.  She is advised to continue vitamin D3 5000 units daily.    - I advised patient to maintain close follow up with Burdine, Virgina Evener, MD for primary care needs.   Time for this visit: 15 minutes. Maureen Yates  participated in the discussions, expressed understanding, and voiced agreement with the above plans.  All questions were answered to her satisfaction. she is encouraged to contact clinic should she have any questions or concerns prior to her return visit.  Follow up plan: Return in about 6 months (around 12/06/2019) for Follow up with Pre-visit Labs.   Time for this visit 15 minutes.  Maureen Yates participated in the discussions, expressed understanding, and voiced agreement with the above plans.  All questions were answered to her satisfaction. she is encouraged to contact clinic should she have any questions or concerns prior to her return  visit.  Glade Lloyd, MD Phone: [phone]  Fax: [phone]  This note was partially dictated with voice recognition software. Similar sounding words can be transcribed inadequately or may not  be corrected upon review.  06/08/2019, 12:14 PM

## 2019-07-07 ENCOUNTER — Inpatient Hospital Stay (HOSPITAL_COMMUNITY): Payer: BC Managed Care – PPO | Attending: Hematology

## 2019-07-07 ENCOUNTER — Other Ambulatory Visit: Payer: Self-pay

## 2019-07-07 DIAGNOSIS — K5900 Constipation, unspecified: Secondary | ICD-10-CM | POA: Insufficient documentation

## 2019-07-07 DIAGNOSIS — R5383 Other fatigue: Secondary | ICD-10-CM | POA: Diagnosis not present

## 2019-07-07 DIAGNOSIS — Z79899 Other long term (current) drug therapy: Secondary | ICD-10-CM | POA: Diagnosis not present

## 2019-07-07 DIAGNOSIS — E559 Vitamin D deficiency, unspecified: Secondary | ICD-10-CM | POA: Diagnosis not present

## 2019-07-07 DIAGNOSIS — F419 Anxiety disorder, unspecified: Secondary | ICD-10-CM | POA: Diagnosis not present

## 2019-07-07 DIAGNOSIS — K589 Irritable bowel syndrome without diarrhea: Secondary | ICD-10-CM | POA: Diagnosis not present

## 2019-07-07 DIAGNOSIS — D509 Iron deficiency anemia, unspecified: Secondary | ICD-10-CM | POA: Insufficient documentation

## 2019-07-07 DIAGNOSIS — K219 Gastro-esophageal reflux disease without esophagitis: Secondary | ICD-10-CM | POA: Insufficient documentation

## 2019-07-07 LAB — CBC WITH DIFFERENTIAL/PLATELET
Abs Immature Granulocytes: 0.09 10*3/uL — ABNORMAL HIGH (ref 0.00–0.07)
Basophils Absolute: 0.1 10*3/uL (ref 0.0–0.1)
Basophils Relative: 1 %
Eosinophils Absolute: 0.4 10*3/uL (ref 0.0–0.5)
Eosinophils Relative: 3 %
HCT: 40.7 % (ref 36.0–46.0)
Hemoglobin: 13.2 g/dL (ref 12.0–15.0)
Immature Granulocytes: 1 %
Lymphocytes Relative: 17 %
Lymphs Abs: 2.2 10*3/uL (ref 0.7–4.0)
MCH: 31.4 pg (ref 26.0–34.0)
MCHC: 32.4 g/dL (ref 30.0–36.0)
MCV: 96.9 fL (ref 80.0–100.0)
Monocytes Absolute: 0.5 10*3/uL (ref 0.1–1.0)
Monocytes Relative: 4 %
Neutro Abs: 9.7 10*3/uL — ABNORMAL HIGH (ref 1.7–7.7)
Neutrophils Relative %: 74 %
Platelets: 387 10*3/uL (ref 150–400)
RBC: 4.2 MIL/uL (ref 3.87–5.11)
RDW: 14.6 % (ref 11.5–15.5)
WBC: 13 10*3/uL — ABNORMAL HIGH (ref 4.0–10.5)
nRBC: 0 % (ref 0.0–0.2)

## 2019-07-07 LAB — COMPREHENSIVE METABOLIC PANEL
ALT: 38 U/L (ref 0–44)
AST: 26 U/L (ref 15–41)
Albumin: 3.8 g/dL (ref 3.5–5.0)
Alkaline Phosphatase: 77 U/L (ref 38–126)
Anion gap: 10 (ref 5–15)
BUN: 9 mg/dL (ref 6–20)
CO2: 25 mmol/L (ref 22–32)
Calcium: 9.2 mg/dL (ref 8.9–10.3)
Chloride: 103 mmol/L (ref 98–111)
Creatinine, Ser: 0.59 mg/dL (ref 0.44–1.00)
GFR calc Af Amer: >60 mL/min (ref >60)
GFR calc non Af Amer: >60 mL/min (ref >60)
Glucose, Bld: 121 mg/dL — ABNORMAL HIGH (ref 70–99)
Potassium: 4.2 mmol/L (ref 3.5–5.1)
Sodium: 138 mmol/L (ref 135–145)
Total Bilirubin: 0.6 mg/dL (ref 0.3–1.2)
Total Protein: 7.4 g/dL (ref 6.5–8.1)

## 2019-07-07 LAB — VITAMIN B12: Vitamin B-12: 365 pg/mL (ref 180–914)

## 2019-07-07 LAB — VITAMIN D 25 HYDROXY (VIT D DEFICIENCY, FRACTURES): Vit D, 25-Hydroxy: 35.87 ng/mL (ref 30–100)

## 2019-07-07 LAB — FERRITIN: Ferritin: 353 ng/mL — ABNORMAL HIGH (ref 11–307)

## 2019-07-07 LAB — IRON AND TIBC
Iron: 54 ug/dL (ref 28–170)
Saturation Ratios: 15 % (ref 10.4–31.8)
TIBC: 358 ug/dL (ref 250–450)
UIBC: 304 ug/dL

## 2019-07-07 LAB — LACTATE DEHYDROGENASE: LDH: 170 U/L (ref 98–192)

## 2019-07-09 ENCOUNTER — Inpatient Hospital Stay (HOSPITAL_BASED_OUTPATIENT_CLINIC_OR_DEPARTMENT_OTHER): Payer: BC Managed Care – PPO | Admitting: Nurse Practitioner

## 2019-07-09 ENCOUNTER — Other Ambulatory Visit: Payer: Self-pay

## 2019-07-09 DIAGNOSIS — Z79899 Other long term (current) drug therapy: Secondary | ICD-10-CM | POA: Diagnosis not present

## 2019-07-09 DIAGNOSIS — D509 Iron deficiency anemia, unspecified: Secondary | ICD-10-CM

## 2019-07-09 DIAGNOSIS — K219 Gastro-esophageal reflux disease without esophagitis: Secondary | ICD-10-CM | POA: Diagnosis not present

## 2019-07-09 DIAGNOSIS — K5900 Constipation, unspecified: Secondary | ICD-10-CM | POA: Diagnosis not present

## 2019-07-09 DIAGNOSIS — K589 Irritable bowel syndrome without diarrhea: Secondary | ICD-10-CM | POA: Diagnosis not present

## 2019-07-09 DIAGNOSIS — R5383 Other fatigue: Secondary | ICD-10-CM | POA: Diagnosis not present

## 2019-07-09 DIAGNOSIS — E559 Vitamin D deficiency, unspecified: Secondary | ICD-10-CM | POA: Diagnosis not present

## 2019-07-09 DIAGNOSIS — F419 Anxiety disorder, unspecified: Secondary | ICD-10-CM | POA: Diagnosis not present

## 2019-07-09 NOTE — Progress Notes (Signed)
[place], [place] [address]   CLINIC:  Medical Oncology/Hematology  PCP:  [name], MD [place] [id number] [phone]   REASON FOR VISIT: Follow-up for iron deficiency anemia  CURRENT THERAPY: Intermittent iron infusions   INTERVAL HISTORY:  Maureen Yates 38 y.o. female returns for routine follow-up for iron deficiency anemia.  Patient reports she is since her last visit.  She denies any bright red bleeding per rectum or melena she denies any easy bruising or bleeding.  She does report problems with constipation due to a switch in her antidepressant medicine.  She is following up closely with her PCP for this. Denies any nausea, vomiting, or diarrhea. Denies any new pains. Had not noticed any recent bleeding such as epistaxis, hematuria or hematochezia. Denies recent chest pain on exertion, shortness of breath on minimal exertion, pre-syncopal episodes, or palpitations. Denies any numbness or tingling in hands or feet. Denies any recent fevers, infections, or recent hospitalizations. Patient reports appetite at 100% and energy level at 75%.  She is eating well maintain her weight this time.    REVIEW OF SYSTEMS:  Review of Systems  HENT:   Positive for nosebleeds.   Gastrointestinal: Positive for constipation.  Neurological: Positive for numbness.  Psychiatric/Behavioral: Positive for sleep disturbance.  All other systems reviewed and are negative.    PAST MEDICAL/SURGICAL HISTORY:  Past Medical History:  Diagnosis Date  . Anxiety   . Environmental allergies   . GERD (gastroesophageal reflux disease)   . IBS (irritable bowel syndrome)   . Tubal ectopic pregnancy    Past Surgical History:  Procedure Laterality Date  . CHOLECYSTECTOMY  Sept of 2012 gallstones  . COLONOSCOPY  02/27/2012   Procedure: COLONOSCOPY;  Surgeon: Rogene Houston, MD;  [facility];  Service: Endoscopy;  Laterality: N/A;  225    . eptopic pregnancy  2015  . rt ovary      removed 03/2010 for 4 pound tumor  . TUBAL LIGATION Bilateral 04/2013     SOCIAL HISTORY:  Social History   Socioeconomic History  . Marital status: Married    Spouse name: Not on file  . Number of children: Not on file  . Years of education: Not on file  . Highest education level: Not on file  Occupational History  . Not on file  Tobacco Use  . Smoking status: Never Smoker  . Smokeless tobacco: Never Used  Substance and Sexual Activity  . Alcohol use: No  . Drug use: No  . Sexual activity: Yes  Other Topics Concern  . Not on file  Social History Narrative  . Not on file   Social Determinants of Health   Financial Resource Strain:   . Difficulty of Paying Living Expenses: Not on file  Food Insecurity:   . Worried About Charity fundraiser in the Last Year: Not on file  . Ran Out of Food in the Last Year: Not on file  Transportation Needs:   . Lack of Transportation (Medical): Not on file  . Lack of Transportation (Non-Medical): Not on file  Physical Activity:   . Days of Exercise per Week: Not on file  . Minutes of Exercise per Session: Not on file  Stress:   . Feeling of Stress : Not on file  Social Connections:   . Frequency of Communication with Friends and Family: Not on file  . Frequency of Social Gatherings with Friends  and Family: Not on file  . Attends Religious Services: Not on file  . Active Member of Clubs or Organizations: Not on file  . Attends Archivist Meetings: Not on file  . Marital Status: Not on file  Intimate Partner Violence:   . Fear of Current or Ex-Partner: Not on file  . Emotionally Abused: Not on file  . Physically Abused: Not on file  . Sexually Abused: Not on file    FAMILY HISTORY:  Family History  Problem Relation Age of Onset  . Epilepsy Mother   . Epilepsy Brother   . Lupus Brother   . Crohn's disease Father   . COPD Father   . Healthy Son   . Allergy (severe) Son    . Healthy Daughter     CURRENT MEDICATIONS:  Outpatient Encounter Medications as of 07/09/2019  Medication Sig Note  . DULoxetine (CYMBALTA) 60 MG capsule Take 60 mg by mouth daily.    . ergocalciferol (VITAMIN D2) 1.25 MG (50000 UT) capsule Take 1 capsule (50,000 Units total) by mouth once a week.   . fluconazole (DIFLUCAN) 150 MG tablet Take 150 mg by mouth every 30 (thirty) days.  03/04/2019: Every 30 days as needed  . lamoTRIgine (LAMICTAL) 100 MG tablet Take 100 mg by mouth daily.   Marland Kitchen levothyroxine (SYNTHROID) 88 MCG tablet TAKE 1 TABLET BY MOUTH ONCE DAILY BEFORE BREAKFAST   . acyclovir (ZOVIRAX) 400 MG tablet Take 400 mg by mouth as needed.    . clindamycin (CLEOCIN T) 1 % lotion Apply topically as needed.    . fexofenadine (ALLEGRA) 180 MG tablet Take 180 mg by mouth as needed.    Marland Kitchen FLUoxetine (PROZAC) 20 MG capsule Take 60 mg by mouth daily.   . methocarbamol (ROBAXIN) 500 MG tablet Take 500 mg by mouth 2 (two) times daily as needed.   Marland Kitchen omeprazole (PRILOSEC) 40 MG capsule Take 1 capsule (40 mg total) by mouth daily. (Patient not taking: Reported on 07/09/2019)   . [DISCONTINUED] Multiple Vitamin (MULTIVITAMIN) tablet Take 1 tablet by mouth daily.    No facility-administered encounter medications on file as of 07/09/2019.    ALLERGIES:  Allergies  Allergen Reactions  . Penicillins Rash  . Casein Other (See Comments)    G.I. Upset  . Augmentin [Amoxicillin-Pot Clavulanate] Swelling and Rash     PHYSICAL EXAM:  ECOG Performance status: 1  Vitals:   07/09/19 0932  BP: (!) 135/92  Pulse: (!) 111  Resp: 18  Temp: 97.9 F (36.6 C)  SpO2: 98%   Filed Weights   07/09/19 0932  Weight: 276 lb (125.2 kg)    Physical Exam Constitutional:      Appearance: Normal appearance.  Cardiovascular:     Rate and Rhythm: Normal rate and regular rhythm.     Heart sounds: Normal heart sounds.  Pulmonary:     Effort: Pulmonary effort is normal.     Breath sounds: Normal  breath sounds.  Abdominal:     General: Bowel sounds are normal.     Palpations: Abdomen is soft.  Musculoskeletal:        General: Normal range of motion.  Skin:    General: Skin is warm.  Neurological:     Mental Status: She is alert and oriented to person, place, and time. Mental status is at baseline.  Psychiatric:        Mood and Affect: Mood normal.        Behavior: Behavior normal.  Thought Content: Thought content normal.        Judgment: Judgment normal.      LABORATORY DATA:  I have reviewed the labs as listed.  CBC    Component Value Date/Time   WBC 13.0 (H) 07/07/2019 1253   RBC 4.20 07/07/2019 1253   HGB 13.2 07/07/2019 1253   HCT 40.7 07/07/2019 1253   PLT 387 07/07/2019 1253   MCV 96.9 07/07/2019 1253   MCH 31.4 07/07/2019 1253   MCHC 32.4 07/07/2019 1253   RDW 14.6 07/07/2019 1253   LYMPHSABS 2.2 07/07/2019 1253   MONOABS 0.5 07/07/2019 1253   EOSABS 0.4 07/07/2019 1253   BASOSABS 0.1 07/07/2019 1253   CMP Latest Ref Rng & Units 07/07/2019 03/02/2019 08/19/2018  Glucose 70 - 99 mg/dL 121(H) 107(H) 99  BUN 6 - 20 mg/dL 9 12 7   Creatinine 0.44 - 1.00 mg/dL 0.59 0.60 0.63  Sodium 135 - 145 mmol/L 138 139 140  Potassium 3.5 - 5.1 mmol/L 4.2 3.8 4.0  Chloride 98 - 111 mmol/L 103 106 106  CO2 22 - 32 mmol/L 25 24 24   Calcium 8.9 - 10.3 mg/dL 9.2 8.9 8.7(L)  Total Protein 6.5 - 8.1 g/dL 7.4 7.6 7.4  Total Bilirubin 0.3 - 1.2 mg/dL 0.6 0.2(L) 0.4  Alkaline Phos 38 - 126 U/L 77 78 72  AST 15 - 41 U/L 26 20 33  ALT 0 - 44 U/L 38 29 43   I personally performed a face-to-face visit.  All questions were answered to patient's stated satisfaction. Encouraged patient to call with any new concerns or questions before his next visit to the cancer center and we can certain see him sooner, if needed.     ASSESSMENT & PLAN:   Iron deficiency anemia 1.  Iron deficiency anemia: - Patient reports she does have occasional bleeding per rectum. 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 -Labs on 07/07/2019 showed potassium 4.2, creatinine 0.59 ferritin 353, percent saturation 15, hemoglobin 13.2, WBC 13.9, platelets 387 -I do not recommend any IV iron at this time. -We will see her back in 4 months with repeat labs.  2.  Severe fatigue: - Patient reports she has a family history of lupus and wishes to be tested. -Patient reports she saw a rheumatologist lupus test was negative.   3.  Vitamin D deficiency: -Labs done on 03/02/2019 showed her vitamin D level at 21.3. -She is taking vitamin D twice daily this is not helping her levels. -I have prescribed vitamin D 50,000 units once weekly -Labs on 07/07/2019 showed vitamin D at 35.87 -We will recheck her labs in 4 months.      Orders placed this encounter:  Orders Placed This Encounter  Procedures  . Lactate dehydrogenase  . CBC with Differential/Platelet  . Comprehensive metabolic panel  . Ferritin  . Iron and TIBC  . Vitamin B12  . Vitamin D 25 hydroxy  . Folate      Francene Finders, FNP-C [facility] [phone]

## 2019-07-09 NOTE — Assessment & Plan Note (Signed)
1.  Iron deficiency anemia: - Patient reports she does have occasional bleeding per rectum. 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 -Labs on 07/07/2019 showed potassium 4.2, creatinine 0.59 ferritin 353, percent saturation 15, hemoglobin 13.2, WBC 13.9, platelets 387 -I do not recommend any IV iron at this time. -We will see her back in 4 months with repeat labs.  2.  Severe fatigue: - Patient reports she has a family history of lupus and wishes to be tested. -Patient reports she saw a rheumatologist lupus test was negative.   3.  Vitamin D deficiency: -Labs done on 03/02/2019 showed her vitamin D level at 21.3. -She is taking vitamin D twice daily this is not helping her levels. -I have prescribed vitamin D 50,000 units once weekly -Labs on 07/07/2019 showed vitamin D at 35.87 -We will recheck her labs in 4 months.

## 2019-07-09 NOTE — Patient Instructions (Signed)
[facility] Discharge Instructions  Follow up in 4 months with labs    Thank you for choosing [facility] at [facility] to provide your oncology and hematology care.  To afford each patient quality time with our provider, please arrive at least 15 minutes before your scheduled appointment time.   If you have a lab appointment with the Cancer Center please come in thru the Main Entrance and check in at the main information desk.  You need to re-schedule your appointment should you arrive 10 or more minutes late.  We strive to give you quality time with our providers, and arriving late affects you and other patients whose appointments are after yours.  Also, if you no show three or more times for appointments you may be dismissed from the clinic at the providers discretion.     Again, thank you for choosing [facility].  Our hope is that these requests will decrease the amount of time that you wait before being seen by our physicians.       _____________________________________________________________  Should you have questions after your visit to [facility], please contact our office at [phone] between the hours of 8:00 a.m. and 4:30 p.m.  Voicemails left after 4:00 p.m. will not be returned until the following business day.  For prescription refill requests, have your pharmacy contact our office and allow 72 hours.    Due to Covid, you will need to wear a mask upon entering the hospital. If you do not have a mask, a mask will be given to you at the Main Entrance upon arrival. For doctor visits, patients may have 1 support person with them. For treatment visits, patients can not have anyone with them due to social distancing guidelines and our immunocompromised population.      

## 2019-07-14 ENCOUNTER — Ambulatory Visit (HOSPITAL_COMMUNITY): Payer: BC Managed Care – PPO | Admitting: Nurse Practitioner

## 2019-09-07 DIAGNOSIS — R5383 Other fatigue: Secondary | ICD-10-CM | POA: Diagnosis not present

## 2019-09-07 DIAGNOSIS — G252 Other specified forms of tremor: Secondary | ICD-10-CM | POA: Diagnosis not present

## 2019-09-07 DIAGNOSIS — F419 Anxiety disorder, unspecified: Secondary | ICD-10-CM | POA: Diagnosis not present

## 2019-09-07 DIAGNOSIS — D509 Iron deficiency anemia, unspecified: Secondary | ICD-10-CM | POA: Diagnosis not present

## 2019-09-09 ENCOUNTER — Other Ambulatory Visit: Payer: Self-pay | Admitting: Family Medicine

## 2019-09-09 ENCOUNTER — Other Ambulatory Visit (HOSPITAL_COMMUNITY): Payer: Self-pay | Admitting: Family Medicine

## 2019-09-09 DIAGNOSIS — R202 Paresthesia of skin: Secondary | ICD-10-CM

## 2019-09-22 DIAGNOSIS — L03032 Cellulitis of left toe: Secondary | ICD-10-CM | POA: Diagnosis not present

## 2019-09-22 DIAGNOSIS — Z6841 Body Mass Index (BMI) 40.0 and over, adult: Secondary | ICD-10-CM | POA: Diagnosis not present

## 2019-10-01 ENCOUNTER — Ambulatory Visit (HOSPITAL_COMMUNITY)
Admission: RE | Admit: 2019-10-01 | Discharge: 2019-10-01 | Disposition: A | Discharge status: Home / Self Care | Payer: BC Managed Care – PPO | Source: Ambulatory Visit | Attending: Family Medicine | Admitting: Family Medicine

## 2019-10-01 ENCOUNTER — Other Ambulatory Visit: Payer: Self-pay

## 2019-10-01 DIAGNOSIS — R202 Paresthesia of skin: Secondary | ICD-10-CM | POA: Diagnosis not present

## 2019-10-02 ENCOUNTER — Ambulatory Visit (HOSPITAL_COMMUNITY): Payer: BC Managed Care – PPO

## 2019-10-07 ENCOUNTER — Encounter: Payer: Self-pay | Admitting: *Deleted

## 2019-10-09 ENCOUNTER — Ambulatory Visit (INDEPENDENT_AMBULATORY_CARE_PROVIDER_SITE_OTHER): Payer: BC Managed Care – PPO | Admitting: Neurology

## 2019-10-09 ENCOUNTER — Other Ambulatory Visit: Payer: Self-pay

## 2019-10-09 ENCOUNTER — Encounter: Payer: Self-pay | Admitting: Neurology

## 2019-10-09 VITALS — BP 135/87 | HR 105 | Temp 98.4°F | Ht 67.0 in | Wt 274.0 lb

## 2019-10-09 DIAGNOSIS — R251 Tremor, unspecified: Secondary | ICD-10-CM | POA: Insufficient documentation

## 2019-10-09 NOTE — Progress Notes (Signed)
PATIENT: Maureen Yates DOB: July 22, 1980  Chief Complaint  Patient presents with  . New Patient (Initial Visit)    Here alone for new tremor in both arms, R arm more prominent. She noticed the tremor started mid January. Started with L index finger, then by February it included her L thumb. First of March she noticed in R arm, it "went crazy out of nowhere". It comes and goes, cannot predict when it will happen. She has videos of it. Xanax has helped calm it some. Her MGGF had parkinson's. 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 also feels an internal vibration some.    Marland Kitchen Referral    Burdine, Virgina Evener, MD     HISTORICAL  Yazmeen Woolf is a 39 year old female, seen in request by her primary care physician Dr. Judd Lien for evaluation of intermittent bilateral hands tremor, initial evaluation was on October 09, 2019.  I have reviewed and summarized the referring note from the referring physician.  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, she noticed intermittent bilateral hands tremor, usually affected either right or left, occasionally both hands, she describes 1 episode her hand was so shaky, is take her twice as long to finish cutting her customer's hair, the shaking can last for few minutes, during the spells, she noticed some subjective weakness of her hand muscles, but the function almost bounced back to normal, she denied persistent sensory change, denied pain,  I personally reviewed MRI of the brain without contrast on October 01, 2019 that was essentially normal Laboratory evaluations in 2020 showed normal folic acid, vitamin D, B12,  ferritin level is elevated at 353, normal LDH, CMP showed mild elevated glucose 121, CBC was mildly elevated 13, normal TSH, free T4, ANA, rheumatoid factor,  REVIEW OF SYSTEMS: Full 14 system review of systems performed and notable only for as above All other review of systems were negative.  ALLERGIES: Allergies  Allergen Reactions  . Penicillins Rash  . Casein Other (See Comments)    G.I. Upset  . Augmentin [Amoxicillin-Pot Clavulanate] Swelling and Rash    HOME MEDICATIONS: Current Outpatient Medications  Medication Sig Dispense Refill  . ALPRAZolam (XANAX) 0.5 MG tablet Take 0.25-0.5 mg by mouth as needed (start of tremor).    . cyclobenzaprine (FLEXERIL) 10 MG tablet Take 10 mg by mouth every 8 (eight) hours as needed.    . DULoxetine (CYMBALTA) 60 MG capsule Take 60 mg by mouth daily.     . ergocalciferol (VITAMIN D2) 1.25 MG (50000 UT) capsule Take 1 capsule (50,000 Units total) by mouth once a week. 30 capsule 1  . fluconazole (DIFLUCAN) 150 MG tablet Take 150 mg by mouth every 30 (thirty) days.     Marland Kitchen lamoTRIgine (LAMICTAL) 100 MG tablet Take 100 mg by mouth daily.    Marland Kitchen levothyroxine (SYNTHROID) 88 MCG tablet TAKE 1 TABLET BY MOUTH ONCE DAILY BEFORE BREAKFAST 90 tablet 1  . mupirocin ointment (BACTROBAN) 2 % APPLY TO AFFECTED AREA TWICE A DAY    . FLUoxetine (PROZAC) 20 MG capsule Take 60 mg by mouth daily.    . methocarbamol (ROBAXIN) 500 MG tablet Take  500 mg by mouth 2 (two) times daily as needed.     No current facility-administered medications for this visit.    PAST MEDICAL HISTORY: Past Medical History:  Diagnosis Date  . Anxiety   . Environmental allergies   . GERD (gastroesophageal reflux disease)   . Hashimoto's thyroiditis   . IBS (irritable bowel syndrome)   . Myofascial pain   . Tremor    right arm  . Tubal ectopic pregnancy     PAST SURGICAL HISTORY: Past Surgical History:  Procedure Laterality Date  . CHOLECYSTECTOMY  Sept of 2012 gallstones  .  COLONOSCOPY  02/27/2012   Procedure: COLONOSCOPY;  Surgeon: Rogene Houston, MD;  [facility];  Service: Endoscopy;  Laterality: N/A;  225  . eptopic pregnancy  2015  . rt ovary      removed 03/2010 for 4 pound tumor  . TUBAL LIGATION Bilateral 04/2013    FAMILY HISTORY: Family History  Problem Relation Age of Onset  . Epilepsy Mother   . Fibromyalgia Mother   . Epilepsy Brother   . Lupus Brother   . Crohn's disease Father   . COPD Father   . Graves' disease Maternal Grandmother   . Healthy Son   . Allergy (severe) Son   . Healthy Daughter   . Parkinson's disease Maternal Great-grandfather     SOCIAL HISTORY: Social History   Socioeconomic History  . Marital status: Married    Spouse name: Corene Cornea  . Number of children: 2  . Years of education: Not on file  . Highest education level: Not on file  Occupational History  . Not on file  Tobacco Use  . Smoking status: Never Smoker  . Smokeless tobacco: Never Used  Substance and Sexual Activity  . Alcohol use: No  . Drug use: No  . Sexual activity: Yes  Other Topics Concern  . Not on file  Social History Narrative   Lives at home with husband and children   Right handed   Caffeine: avg of 16 oz soda daily   Social Determinants of Health   Financial Resource Strain:   . Difficulty of Paying Living Expenses:   Food Insecurity:   . Worried About Charity fundraiser in the Last Year:   . Arboriculturist in the Last Year:   Transportation Needs:   . Film/video editor (Medical):   Marland Kitchen Lack of Transportation (Non-Medical):   Physical Activity:   . Days of Exercise per Week:   . Minutes of Exercise per Session:   Stress:   . Feeling of Stress :   Social Connections:   . Frequency of Communication with Friends and Family:   . Frequency of Social Gatherings with Friends and Family:   . Attends Religious Services:   . Active Member of Clubs or Organizations:   . Attends Archivist Meetings:     Marland Kitchen Marital Status:   Intimate Partner Violence:   . Fear of Current or Ex-Partner:   . Emotionally Abused:   Marland Kitchen Physically Abused:   . Sexually Abused:      PHYSICAL EXAM   Vitals:   10/09/19 1053  BP: 135/87  Pulse: (!) 105  Temp: 98.4 F (36.9 C)  Weight: 274 lb (124.3 kg)  Height: 5' 7"  (1.702 m)    Not recorded      Body mass index is 42.91 kg/m.  PHYSICAL EXAMNIATION:  Gen: NAD, conversant, well nourised, well groomed  Cardiovascular: Regular rate rhythm, no peripheral edema, warm, nontender. Eyes: Conjunctivae clear without exudates or hemorrhage Neck: Supple, no carotid bruits. Pulmonary: Clear to auscultation bilaterally   NEUROLOGICAL EXAM:  MENTAL STATUS: Speech:    Speech is normal; fluent and spontaneous with normal comprehension.  Cognition:     Orientation to time, place and person     Normal recent and remote memory     Normal Attention span and concentration     Normal Language, naming, repeating,spontaneous speech     Fund of knowledge   CRANIAL NERVES: CN II: Visual fields are full to confrontation. Pupils are round equal and briskly reactive to light. CN III, IV, VI: extraocular movement are normal. No ptosis. CN V: Facial sensation is intact to light touch CN VII: Face is symmetric with normal eye closure  CN VIII: Hearing is normal to causal conversation. CN IX, X: Phonation is normal. CN XI: Head turning and shoulder shrug are intact  MOTOR: I witnessed intermittent right hand posturing tremor, most consistent with exaggerated physiological tremor, muscle bulk and tone are normal. Muscle strength is normal.  REFLEXES: Reflexes are 2+ and symmetric at the biceps, triceps, knees, and ankles. Plantar responses are flexor.  SENSORY: Intact to light touch, pinprick and vibratory sensation are intact in fingers and toes.  COORDINATION: There is no trunk or limb dysmetria noted.  GAIT/STANCE: Posture is normal.  Gait is steady with normal steps, base, arm swing, and turning. Heel and toe walking are normal. Tandem gait is normal.  Romberg is absent.   DIAGNOSTIC DATA (LABS, IMAGING, TESTING) - I reviewed patient records, labs, notes, testing and imaging myself where available.   ASSESSMENT AND PLAN  Francina Beery is a 39 y.o. 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 Ph.D.  [facility] [address], [place], [place] [address] Ph: [phone] Fax: [phone]  CC: [name], MD

## 2019-10-29 ENCOUNTER — Other Ambulatory Visit (HOSPITAL_COMMUNITY): Payer: BC Managed Care – PPO

## 2019-11-05 ENCOUNTER — Ambulatory Visit (HOSPITAL_COMMUNITY): Payer: BC Managed Care – PPO | Admitting: Nurse Practitioner

## 2019-11-07 ENCOUNTER — Other Ambulatory Visit (HOSPITAL_COMMUNITY): Payer: Self-pay | Admitting: Nurse Practitioner

## 2019-11-07 DIAGNOSIS — E559 Vitamin D deficiency, unspecified: Secondary | ICD-10-CM

## 2019-11-29 ENCOUNTER — Other Ambulatory Visit: Payer: Self-pay | Admitting: "Endocrinology

## 2019-11-30 DIAGNOSIS — E038 Other specified hypothyroidism: Secondary | ICD-10-CM | POA: Diagnosis not present

## 2019-11-30 DIAGNOSIS — E063 Autoimmune thyroiditis: Secondary | ICD-10-CM | POA: Diagnosis not present

## 2019-11-30 LAB — TSH: TSH: 0.99 mIU/L

## 2019-11-30 LAB — T4, FREE: Free T4: 1.1 ng/dL (ref 0.8–1.8)

## 2019-12-08 ENCOUNTER — Inpatient Hospital Stay (HOSPITAL_COMMUNITY): Payer: BC Managed Care – PPO | Attending: Hematology

## 2019-12-08 ENCOUNTER — Encounter: Payer: Self-pay | Admitting: "Endocrinology

## 2019-12-08 ENCOUNTER — Other Ambulatory Visit: Payer: Self-pay

## 2019-12-08 ENCOUNTER — Ambulatory Visit (INDEPENDENT_AMBULATORY_CARE_PROVIDER_SITE_OTHER): Payer: BC Managed Care – PPO | Admitting: "Endocrinology

## 2019-12-08 VITALS — BP 131/89 | HR 106 | Ht 67.0 in | Wt 278.4 lb

## 2019-12-08 DIAGNOSIS — K625 Hemorrhage of anus and rectum: Secondary | ICD-10-CM | POA: Insufficient documentation

## 2019-12-08 DIAGNOSIS — R200 Anesthesia of skin: Secondary | ICD-10-CM | POA: Insufficient documentation

## 2019-12-08 DIAGNOSIS — Z79899 Other long term (current) drug therapy: Secondary | ICD-10-CM | POA: Diagnosis not present

## 2019-12-08 DIAGNOSIS — K589 Irritable bowel syndrome without diarrhea: Secondary | ICD-10-CM | POA: Insufficient documentation

## 2019-12-08 DIAGNOSIS — E038 Other specified hypothyroidism: Secondary | ICD-10-CM

## 2019-12-08 DIAGNOSIS — E063 Autoimmune thyroiditis: Secondary | ICD-10-CM

## 2019-12-08 DIAGNOSIS — K219 Gastro-esophageal reflux disease without esophagitis: Secondary | ICD-10-CM | POA: Insufficient documentation

## 2019-12-08 DIAGNOSIS — R61 Generalized hyperhidrosis: Secondary | ICD-10-CM | POA: Diagnosis not present

## 2019-12-08 DIAGNOSIS — D509 Iron deficiency anemia, unspecified: Secondary | ICD-10-CM

## 2019-12-08 DIAGNOSIS — F419 Anxiety disorder, unspecified: Secondary | ICD-10-CM | POA: Insufficient documentation

## 2019-12-08 DIAGNOSIS — E559 Vitamin D deficiency, unspecified: Secondary | ICD-10-CM | POA: Diagnosis not present

## 2019-12-08 DIAGNOSIS — R5383 Other fatigue: Secondary | ICD-10-CM | POA: Insufficient documentation

## 2019-12-08 DIAGNOSIS — K5900 Constipation, unspecified: Secondary | ICD-10-CM | POA: Diagnosis not present

## 2019-12-08 LAB — CBC WITH DIFFERENTIAL/PLATELET
Abs Immature Granulocytes: 0.05 10*3/uL (ref 0.00–0.07)
Basophils Absolute: 0.1 10*3/uL (ref 0.0–0.1)
Basophils Relative: 1 %
Eosinophils Absolute: 0.5 10*3/uL (ref 0.0–0.5)
Eosinophils Relative: 4 %
HCT: 42.9 % (ref 36.0–46.0)
Hemoglobin: 13.9 g/dL (ref 12.0–15.0)
Immature Granulocytes: 0 %
Lymphocytes Relative: 18 %
Lymphs Abs: 2.2 10*3/uL (ref 0.7–4.0)
MCH: 30.8 pg (ref 26.0–34.0)
MCHC: 32.4 g/dL (ref 30.0–36.0)
MCV: 95.1 fL (ref 80.0–100.0)
Monocytes Absolute: 0.6 10*3/uL (ref 0.1–1.0)
Monocytes Relative: 5 %
Neutro Abs: 9.2 10*3/uL — ABNORMAL HIGH (ref 1.7–7.7)
Neutrophils Relative %: 72 %
Platelets: 475 10*3/uL — ABNORMAL HIGH (ref 150–400)
RBC: 4.51 MIL/uL (ref 3.87–5.11)
RDW: 13.7 % (ref 11.5–15.5)
WBC: 12.6 10*3/uL — ABNORMAL HIGH (ref 4.0–10.5)
nRBC: 0 % (ref 0.0–0.2)

## 2019-12-08 LAB — COMPREHENSIVE METABOLIC PANEL
ALT: 54 U/L — ABNORMAL HIGH (ref 0–44)
AST: 37 U/L (ref 15–41)
Albumin: 4.4 g/dL (ref 3.5–5.0)
Alkaline Phosphatase: 89 U/L (ref 38–126)
Anion gap: 11 (ref 5–15)
BUN: 7 mg/dL (ref 6–20)
CO2: 26 mmol/L (ref 22–32)
Calcium: 9.3 mg/dL (ref 8.9–10.3)
Chloride: 102 mmol/L (ref 98–111)
Creatinine, Ser: 0.58 mg/dL (ref 0.44–1.00)
GFR calc Af Amer: >60 mL/min (ref >60)
GFR calc non Af Amer: >60 mL/min (ref >60)
Glucose, Bld: 111 mg/dL — ABNORMAL HIGH (ref 70–99)
Potassium: 3.5 mmol/L (ref 3.5–5.1)
Sodium: 139 mmol/L (ref 135–145)
Total Bilirubin: 0.4 mg/dL (ref 0.3–1.2)
Total Protein: 8.1 g/dL (ref 6.5–8.1)

## 2019-12-08 LAB — VITAMIN D 25 HYDROXY (VIT D DEFICIENCY, FRACTURES): Vit D, 25-Hydroxy: 41.13 ng/mL (ref 30–100)

## 2019-12-08 LAB — IRON AND TIBC
Iron: 56 ug/dL (ref 28–170)
Saturation Ratios: 15 % (ref 10.4–31.8)
TIBC: 377 ug/dL (ref 250–450)
UIBC: 321 ug/dL

## 2019-12-08 LAB — FERRITIN: Ferritin: 417 ng/mL — ABNORMAL HIGH (ref 11–307)

## 2019-12-08 LAB — LACTATE DEHYDROGENASE: LDH: 185 U/L (ref 98–192)

## 2019-12-08 LAB — FOLATE: Folate: 16.5 ng/mL (ref >5.9)

## 2019-12-08 LAB — VITAMIN B12: Vitamin B-12: 516 pg/mL (ref 180–914)

## 2019-12-08 NOTE — Progress Notes (Signed)
12/08/2019                                Endocrinology Telehealth Visit Follow up Note -During COVID -19 Pandemic  I connected with Maureen Yates on 12/08/2019   by telephone and verified that I am speaking with the correct person using two identifiers. Maureen Yates, 10-23-1980. she has verbally consented to this visit. All issues noted in this document were discussed and addressed. The format was not optimal for physical exam.    Subjective:    Patient ID: Maureen Yates, female    DOB: 14-Aug-1980, PCP Pleas Koch Virgina Evener, MD   Past Medical History:  Diagnosis Date   Anxiety    Environmental allergies    GERD (gastroesophageal reflux disease)    Hashimoto's thyroiditis    IBS (irritable bowel syndrome)    Myofascial pain    Tremor    right arm   Tubal ectopic pregnancy    Past Surgical History:  Procedure Laterality Date   CHOLECYSTECTOMY  Sept of 2012 gallstones   COLONOSCOPY  02/27/2012   Procedure: COLONOSCOPY;  Surgeon: Rogene Houston, MD;  [facility];  Service: Endoscopy;  Laterality: N/A;  225   eptopic pregnancy  2015   rt ovary      removed 03/2010 for 4 pound tumor   TUBAL LIGATION Bilateral 04/2013   Social History   Socioeconomic History   Marital status: Married    Spouse name: Corene Cornea   Number of children: 2   Years of education: Not on file   Highest education level: Not on file  Occupational History   Not on file  Tobacco Use   Smoking status: Never Smoker   Smokeless tobacco: Never Used  Substance and Sexual Activity   Alcohol use: No   Drug use: No   Sexual activity: Yes  Other Topics Concern   Not on file  Social History Narrative   Lives at home with husband and children   Right handed   Caffeine: avg of 16 oz soda daily   Social Determinants of Health   Financial Resource Strain:    Difficulty of Paying Living Expenses:   Food Insecurity:    Worried About Charity fundraiser in the Last  Year:    Arboriculturist in the Last Year:   Transportation Needs:    Film/video editor (Medical):    Lack of Transportation (Non-Medical):   Physical Activity:    Days of Exercise per Week:    Minutes of Exercise per Session:   Stress:    Feeling of Stress :   Social Connections:    Frequency of Communication with Friends and Family:    Frequency of Social Gatherings with Friends and Family:    Attends Religious Services:    Active Member of Clubs or Organizations:    Attends Archivist Meetings:    Marital Status:    Outpatient Encounter Medications as of 12/08/2019  Medication Sig   ALPRAZolam (XANAX) 0.5 MG tablet Take 0.25-0.5 mg by mouth as needed (start of tremor).   cyclobenzaprine (FLEXERIL) 10 MG tablet Take 10 mg by mouth every 8 (eight) hours as needed.   DULoxetine (CYMBALTA) 60 MG capsule Take 60 mg by mouth daily.    fluconazole (DIFLUCAN) 150 MG tablet Take 150 mg by mouth every 30 (thirty) days.    lamoTRIgine (LAMICTAL) 100 MG tablet Take 100  mg by mouth daily.   levothyroxine (SYNTHROID) 88 MCG tablet TAKE 1 TABLET BY MOUTH ONCE DAILY BEFORE BREAKFAST   methocarbamol (ROBAXIN) 500 MG tablet Take 500 mg by mouth 2 (two) times daily as needed.   mupirocin ointment (BACTROBAN) 2 % APPLY TO AFFECTED AREA TWICE A DAY   Vitamin D, Ergocalciferol, (DRISDOL) 1.25 MG (50000 UNIT) CAPS capsule TAKE 1 CAPSULE BY MOUTH ONE TIME PER WEEK   [DISCONTINUED] FLUoxetine (PROZAC) 20 MG capsule Take 60 mg by mouth daily.   No facility-administered encounter medications on file as of 12/08/2019.   ALLERGIES: Allergies  Allergen Reactions   Penicillins Rash   Casein Other (See Comments)    G.I. Upset   Augmentin [Amoxicillin-Pot Clavulanate] Swelling and Rash    VACCINATION STATUS:  There is no immunization history on file for this patient.  HPI  39 year old female patient with medical history as above. She is being seen in Follow-up  for hypothyroidism, vitamin D deficiency.  She is currently on levothyroxine 88 mcg p.o. daily before breakfast.  She reports compliance with medication.  She has no new complaints today, except the fact that she cannot lose weight. -  She has family history of Graves' disease in one of her grandparents. -She denies heat intolerance nor cold intolerance. She denies any dysphagia, shortness of breath, nor voice change. -She is on antianxiety medications including Prozac and Lamictal.   Review of Systems  Limited as above.  Objective:    BP 131/89    Pulse (!) 106    Ht 5' 7"  (1.702 m)    Wt 278 lb 6.4 oz (126.3 kg)    BMI 43.60 kg/m   Wt Readings from Last 3 Encounters:  12/08/19 278 lb 6.4 oz (126.3 kg)  10/09/19 274 lb (124.3 kg)  07/09/19 276 lb (125.2 kg)        Recent Results (from the past 2160 hour(s))  TSH SOLSTAS     Status: None   Collection Time: 11/30/19  9:55 AM  Result Value Ref Range   TSH 0.99 mIU/L    Comment:           Reference Range .           > or = 20 Years  0.40-4.50 .                Pregnancy Ranges           First trimester    0.26-2.66           Second trimester   0.55-2.73           Third trimester    0.43-2.91   T4, free SOLSTAS     Status: None   Collection Time: 11/30/19  9:55 AM  Result Value Ref Range   Free T4 1.1 0.8 - 1.8 ng/dL  Lactate dehydrogenase     Status: None   Collection Time: 12/08/19  3:01 PM  Result Value Ref Range   LDH 185 98 - 192 U/L    Comment: Performed at [facility], [address], [place], [place] [address]  CBC with Differential/Platelet     Status: Abnormal   Collection Time: 12/08/19  3:01 PM  Result Value Ref Range   WBC 12.6 (H) 4.0 - 10.5 K/uL   RBC 4.51 3.87 - 5.11 MIL/uL   Hemoglobin 13.9 12.0 - 15.0 g/dL   HCT 42.9 36.0 - 46.0 %   MCV 95.1 80.0 - 100.0 fL   MCH 30.8  26.0 - 34.0 pg   MCHC 32.4 30.0 - 36.0 g/dL   RDW 13.7 11.5 - 15.5 %   Platelets 475 (H) 150 - 400 K/uL   nRBC 0.0 0.0 - 0.2 %    Neutrophils Relative % 72 %   Neutro Abs 9.2 (H) 1.7 - 7.7 K/uL   Lymphocytes Relative 18 %   Lymphs Abs 2.2 0.7 - 4.0 K/uL   Monocytes Relative 5 %   Monocytes Absolute 0.6 0.1 - 1.0 K/uL   Eosinophils Relative 4 %   Eosinophils Absolute 0.5 0.0 - 0.5 K/uL   Basophils Relative 1 %   Basophils Absolute 0.1 0.0 - 0.1 K/uL   Immature Granulocytes 0 %   Abs Immature Granulocytes 0.05 0.00 - 0.07 K/uL    Comment: Performed at [facility], [address] [place], [place] [address]  Comprehensive metabolic panel     Status: Abnormal   Collection Time: 12/08/19  3:01 PM  Result Value Ref Range   Sodium 139 135 - 145 mmol/L   Potassium 3.5 3.5 - 5.1 mmol/L   Chloride 102 98 - 111 mmol/L   CO2 26 22 - 32 mmol/L   Glucose, Bld 111 (H) 70 - 99 mg/dL    Comment: Glucose reference range applies only to samples taken after fasting for at least 8 hours.   BUN 7 6 - 20 mg/dL   Creatinine, Ser 0.58 0.44 - 1.00 mg/dL   Calcium 9.3 8.9 - 10.3 mg/dL   Total Protein 8.1 6.5 - 8.1 g/dL   Albumin 4.4 3.5 - 5.0 g/dL   AST 37 15 - 41 U/L   ALT 54 (H) 0 - 44 U/L   Alkaline Phosphatase 89 38 - 126 U/L   Total Bilirubin 0.4 0.3 - 1.2 mg/dL   GFR calc non Af Amer >60 >60 mL/min   GFR calc Af Amer >60 >60 mL/min   Anion gap 11 5 - 15    Comment: Performed at [facility], [address], [place], [place] [address]  Ferritin     Status: Abnormal   Collection Time: 12/08/19  3:01 PM  Result Value Ref Range   Ferritin 417 (H) 11 - 307 ng/mL    Comment: Performed at [facility], [address] [place], [place] [address]  Iron and TIBC     Status: None   Collection Time: 12/08/19  3:01 PM  Result Value Ref Range   Iron 56 28 - 170 ug/dL   TIBC 377 250 - 450 ug/dL   Saturation Ratios 15 10.4 - 31.8 %   UIBC 321 ug/dL    Comment: Performed at [facility], [address] [place], [place] [address]  Vitamin B12     Status: None   Collection Time: 12/08/19  3:01 PM  Result Value Ref Range   Vitamin B-12  516 180 - 914 pg/mL    Comment: (NOTE) This assay is not validated for testing neonatal or myeloproliferative syndrome specimens for Vitamin B12 levels. Performed at [facility], [address] [place], [place] [address]   Folate     Status: None   Collection Time: 12/08/19  3:01 PM  Result Value Ref Range   Folate 16.5 >5.9 ng/mL    Comment: Performed at [facility], [address], [place], [place] [address]       October 17, 2016 lab work showed TPO antibodies normal at 14, thyroglobulin antibodies <1, free T4 1.6, TSH 1.83, free T3 3.0  Thyroid sonograms  from 09/24/2016 showed right lobe 4.4 cm left lobe 4.8 cm. There are multiple punctate/partially cystic nodules, largest 0.6 cm on the left lobe, and 0.4 cm on the right lobe.  Thyroid ultrasound from 03/13/2017, right lobe 5 cm with 0.6 cm nodule, left lobe 4.8 cm with 0.5 cm stable nodule.  Her thyroid ultrasound on May 13, 2018 showed right lobe 4.9 cm, previously 5 cm; left lobe 4.8 cm unchanged from prior study in September 2018.  0.7 cm nodule on the right lobe which does not meet any imaging criteria for biopsy.  0.6 cm left lobe nodule also does not meet criteria for biopsy.   Assessment & Plan:   1.  Hypothyroidism 2.  Multinodular  Goiter 3.  Vitamin D deficiency -Her previsit thyroid function tests are consistent with appropriate replacement.  Given her recent unexplained resting tremors, she will be kept on the same dose of levothyroxine at 88 mcg p.o. daily before breakfast.     - We discussed about the correct intake of her thyroid hormone, on empty stomach at fasting, with water, separated by at least 30 minutes from breakfast and other medications,  and separated by more than 4 hours from calcium, iron, multivitamins, acid reflux medications (PPIs). -Patient is made aware of the fact that thyroid hormone replacement is needed for life, dose to be adjusted by periodic monitoring of thyroid function  tests.  -Should be considered for repeat ultrasound after her next visit. -She is advised to continue vitamin D supplement with 50,000 units weekly until she finishes her current supply. -Regarding her concern about weight: - she  admits there is a room for improvement in her diet and drink choices. -  Suggestion is made for her to avoid simple carbohydrates  from her diet including Cakes, Sweet Desserts / Pastries, Ice Cream, Soda (diet and regular), Sweet Tea, Candies, Chips, Cookies, Sweet Pastries,  Store Bought Juices, Alcohol in Excess of  1-2 drinks a day, Artificial Sweeteners, Coffee Creamer, and "Sugar-free" Products. This will help patient to have stable blood glucose profile and potentially avoid unintended weight gain. She will be arranged to see a dietitian. She is also given information and contact number for bariatric surgery.   - I advised patient to maintain close follow up with Burdine, Virgina Evener, MD for primary care needs.    - Time spent on this patient care encounter:  20 minutes of which 50% was spent in  counseling and the rest reviewing  her current and  previous labs / studies and medications  doses and developing a plan for long term care. Maureen Yates  participated in the discussions, expressed understanding, and voiced agreement with the above plans.  All questions were answered to her satisfaction. she is encouraged to contact clinic should she have any questions or concerns prior to her return visit. Follow up plan: Return in about 6 months (around 06/08/2020) for F/U with Pre-visit Labs.   Time for this visit 15 minutes.  Maureen Yates participated in the discussions, expressed understanding, and voiced agreement with the above plans.  All questions were answered to her satisfaction. she is encouraged to contact clinic should she have any questions or concerns prior to her return visit.  Glade Lloyd, MD Phone: [phone]  Fax: [phone]  This note  was partially dictated with voice recognition software. Similar sounding words can be transcribed inadequately or may not  be corrected upon review.  12/08/2019, 6:10 PM

## 2019-12-08 NOTE — Patient Instructions (Signed)

## 2019-12-09 ENCOUNTER — Inpatient Hospital Stay (HOSPITAL_COMMUNITY): Payer: Self-pay

## 2019-12-09 ENCOUNTER — Other Ambulatory Visit (HOSPITAL_COMMUNITY): Payer: Self-pay

## 2019-12-10 ENCOUNTER — Ambulatory Visit: Payer: BC Managed Care – PPO | Admitting: Neurology

## 2019-12-16 ENCOUNTER — Inpatient Hospital Stay (HOSPITAL_COMMUNITY): Payer: BC Managed Care – PPO | Admitting: Nurse Practitioner

## 2019-12-16 VITALS — BP 140/84 | HR 111 | Temp 97.8°F | Resp 18 | Wt 279.4 lb

## 2019-12-16 DIAGNOSIS — R200 Anesthesia of skin: Secondary | ICD-10-CM | POA: Diagnosis not present

## 2019-12-16 DIAGNOSIS — R5383 Other fatigue: Secondary | ICD-10-CM | POA: Diagnosis not present

## 2019-12-16 DIAGNOSIS — K589 Irritable bowel syndrome without diarrhea: Secondary | ICD-10-CM | POA: Diagnosis not present

## 2019-12-16 DIAGNOSIS — R61 Generalized hyperhidrosis: Secondary | ICD-10-CM | POA: Diagnosis not present

## 2019-12-16 DIAGNOSIS — D509 Iron deficiency anemia, unspecified: Secondary | ICD-10-CM

## 2019-12-16 DIAGNOSIS — F419 Anxiety disorder, unspecified: Secondary | ICD-10-CM | POA: Diagnosis not present

## 2019-12-16 DIAGNOSIS — E559 Vitamin D deficiency, unspecified: Secondary | ICD-10-CM | POA: Diagnosis not present

## 2019-12-16 DIAGNOSIS — K625 Hemorrhage of anus and rectum: Secondary | ICD-10-CM | POA: Diagnosis not present

## 2019-12-16 DIAGNOSIS — E063 Autoimmune thyroiditis: Secondary | ICD-10-CM | POA: Diagnosis not present

## 2019-12-16 DIAGNOSIS — Z79899 Other long term (current) drug therapy: Secondary | ICD-10-CM | POA: Diagnosis not present

## 2019-12-16 DIAGNOSIS — K5900 Constipation, unspecified: Secondary | ICD-10-CM | POA: Diagnosis not present

## 2019-12-16 DIAGNOSIS — K219 Gastro-esophageal reflux disease without esophagitis: Secondary | ICD-10-CM | POA: Diagnosis not present

## 2019-12-16 NOTE — Progress Notes (Signed)
Maureen Yates,  [id number]   CLINIC:  Medical Oncology/Hematology  PCP:  [name], MD [place] [id number] [phone]   REASON FOR VISIT: Follow-up for iron deficiency anemia   CURRENT THERAPY: Intermittent iron infusions   INTERVAL HISTORY:  Maureen Yates 39 y.o. female returns for routine follow-up for iron deficiency anemia.  Patient reports she is doing well since her last visit.  She does still have chronic fatigue.  She denies any bright red bleeding per rectum or melena.  She denies any easy bruising or bleeding.  She does report having severe night sweats where she has to change the bed at night.  She has been having these for quite a while now.  Denies any nausea, vomiting, or diarrhea. Denies any new pains. Had not noticed any recent bleeding such as epistaxis, hematuria or hematochezia. Denies recent chest pain on exertion, shortness of breath on minimal exertion, pre-syncopal episodes, or palpitations. Denies any numbness or tingling in hands or feet. Denies any recent fevers, infections, or recent hospitalizations. Patient reports appetite at 75% and energy level at 50%.  She is eating well maintain her weight at this time.     REVIEW OF SYSTEMS:  Review of Systems  Constitutional: Positive for fatigue.  Gastrointestinal: Positive for constipation.  Neurological: Positive for numbness.  Psychiatric/Behavioral: Positive for sleep disturbance.  All other systems reviewed and are negative.    PAST MEDICAL/SURGICAL HISTORY:  Past Medical History:  Diagnosis Date  . Anxiety   . Environmental allergies   . GERD (gastroesophageal reflux disease)   . Hashimoto's thyroiditis   . IBS (irritable bowel syndrome)   . Myofascial pain   . Tremor    right arm  . Tubal ectopic pregnancy    Past Surgical History:  Procedure Laterality Date  . CHOLECYSTECTOMY  Sept of 2012 gallstones  . COLONOSCOPY  02/27/2012     Procedure: COLONOSCOPY;  Surgeon: Rogene Houston, MD;  [facility];  Service: Endoscopy;  Laterality: N/A;  225  . eptopic pregnancy  2015  . rt ovary      removed 03/2010 for 4 pound tumor  . TUBAL LIGATION Bilateral 04/2013     SOCIAL HISTORY:  Social History   Socioeconomic History  . Marital status: Married    Spouse name: Corene Cornea  . Number of children: 2  . Years of education: Not on file  . Highest education level: Not on file  Occupational History  . Not on file  Tobacco Use  . Smoking status: Never Smoker  . Smokeless tobacco: Never Used  Substance and Sexual Activity  . Alcohol use: No  . Drug use: No  . Sexual activity: Yes  Other Topics Concern  . Not on file  Social History Narrative   Lives at home with husband and children   Right handed   Caffeine: avg of 16 oz soda daily   Social Determinants of Health   Financial Resource Strain:   . Difficulty of Paying Living Expenses:   Food Insecurity:   . Worried About Charity fundraiser in the Last Year:   . Arboriculturist in the Last Year:   Transportation Needs:   . Film/video editor (Medical):   Marland Kitchen Lack of Transportation (Non-Medical):   Physical Activity:   . Days of Exercise per Week:   . Minutes of Exercise per Session:   Stress:   . Feeling  of Stress :   Social Connections:   . Frequency of Communication with Friends and Family:   . Frequency of Social Gatherings with Friends and Family:   . Attends Religious Services:   . Active Member of Clubs or Organizations:   . Attends Archivist Meetings:   Marland Kitchen Marital Status:   Intimate Partner Violence:   . Fear of Current or Ex-Partner:   . Emotionally Abused:   Marland Kitchen Physically Abused:   . Sexually Abused:     FAMILY HISTORY:  Family History  Problem Relation Age of Onset  . Epilepsy Mother   . Fibromyalgia Mother   . Epilepsy Brother   . Lupus Brother   . Crohn's disease Father   . COPD Father   . Graves' disease  Maternal Grandmother   . Healthy Son   . Allergy (severe) Son   . Healthy Daughter   . Parkinson's disease Maternal Great-grandfather     CURRENT MEDICATIONS:  Outpatient Encounter Medications as of 12/16/2019  Medication Sig Note  . DULoxetine (CYMBALTA) 60 MG capsule Take 60 mg by mouth daily.    Marland Kitchen ketoconazole (NIZORAL) 2 % shampoo USE AS BODY WASH AS DIRECTED   . lamoTRIgine (LAMICTAL) 100 MG tablet Take 100 mg by mouth daily.   Marland Kitchen levothyroxine (SYNTHROID) 88 MCG tablet TAKE 1 TABLET BY MOUTH ONCE DAILY BEFORE BREAKFAST   . mupirocin ointment (BACTROBAN) 2 % APPLY TO AFFECTED AREA TWICE A DAY   . Vitamin D, Ergocalciferol, (DRISDOL) 1.25 MG (50000 UNIT) CAPS capsule TAKE 1 CAPSULE BY MOUTH ONE TIME PER WEEK   . ALPRAZolam (XANAX) 0.5 MG tablet Take 0.25-0.5 mg by mouth as needed (start of tremor).   . cyclobenzaprine (FLEXERIL) 10 MG tablet Take 10 mg by mouth every 8 (eight) hours as needed.   . fluconazole (DIFLUCAN) 150 MG tablet Take 150 mg by mouth every 30 (thirty) days.  10/09/2019: As needed  . methocarbamol (ROBAXIN) 500 MG tablet Take 500 mg by mouth 2 (two) times daily as needed.    No facility-administered encounter medications on file as of 12/16/2019.    ALLERGIES:  Allergies  Allergen Reactions  . Penicillins Rash  . Casein Other (See Comments)    G.I. Upset  . Augmentin [Amoxicillin-Pot Clavulanate] Swelling and Rash     PHYSICAL EXAM:  ECOG Performance status: 1  Vitals:   12/16/19 1102  BP: 140/84  Pulse: (!) 111  Resp: 18  Temp: 97.8 F (36.6 C)  SpO2: 99%   Filed Weights   12/16/19 1102  Weight: 279 lb 6.4 oz (126.7 kg)   Physical Exam Constitutional:      Appearance: Normal appearance. She is normal weight.  Cardiovascular:     Rate and Rhythm: Normal rate and regular rhythm.     Heart sounds: Normal heart sounds.  Pulmonary:     Effort: Pulmonary effort is normal.     Breath sounds: Normal breath sounds.  Abdominal:     General: Bowel  sounds are normal.     Palpations: Abdomen is soft.  Musculoskeletal:        General: Normal range of motion.     Cervical back: Normal range of motion and neck supple.  Skin:    General: Skin is warm.  Neurological:     Mental Status: She is alert and oriented to person, place, and time. Mental status is at baseline.  Psychiatric:        Mood and Affect: Mood normal.  Behavior: Behavior normal.        Thought Content: Thought content normal.        Judgment: Judgment normal.      LABORATORY DATA:  I have reviewed the labs as listed.  CBC    Component Value Date/Time   WBC 12.6 (H) 12/08/2019 1501   RBC 4.51 12/08/2019 1501   HGB 13.9 12/08/2019 1501   HCT 42.9 12/08/2019 1501   PLT 475 (H) 12/08/2019 1501   MCV 95.1 12/08/2019 1501   MCH 30.8 12/08/2019 1501   MCHC 32.4 12/08/2019 1501   RDW 13.7 12/08/2019 1501   LYMPHSABS 2.2 12/08/2019 1501   MONOABS 0.6 12/08/2019 1501   EOSABS 0.5 12/08/2019 1501   BASOSABS 0.1 12/08/2019 1501   CMP Latest Ref Rng & Units 12/08/2019 07/07/2019 03/02/2019  Glucose 70 - 99 mg/dL 111(H) 121(H) 107(H)  BUN 6 - 20 mg/dL 7 9 12   Creatinine 0.44 - 1.00 mg/dL 0.58 0.59 0.60  Sodium 135 - 145 mmol/L 139 138 139  Potassium 3.5 - 5.1 mmol/L 3.5 4.2 3.8  Chloride 98 - 111 mmol/L 102 103 106  CO2 22 - 32 mmol/L 26 25 24   Calcium 8.9 - 10.3 mg/dL 9.3 9.2 8.9  Total Protein 6.5 - 8.1 g/dL 8.1 7.4 7.6  Total Bilirubin 0.3 - 1.2 mg/dL 0.4 0.6 0.2(L)  Alkaline Phos 38 - 126 U/L 89 77 78  AST 15 - 41 U/L 37 26 20  ALT 0 - 44 U/L 54(H) 38 29    All questions were answered to patient's stated satisfaction. Encouraged patient to call with any new concerns or questions before his next visit to the cancer center and we can certain see him sooner, if needed.     ASSESSMENT & PLAN:  Iron deficiency anemia 1.  Iron deficiency anemia: - Patient reports she does have occasional bleeding per rectum. 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 -Labs on 12/08/2019 showed potassium 3.5, creatinine 0.58 ferritin 417, percent saturation 15, hemoglobin 13.9, WBC 12.6, platelets 475 -I do not recommend any IV iron at this time. -We will see her back in 4 months with repeat labs.  2.  Severe fatigue: - Patient reports she has a family history of lupus and wishes to be tested. -Patient reports she saw a rheumatologist lupus test was negative.  3.  Vitamin D deficiency: -Labs done on 03/02/2019 showed her vitamin D level at 21.3. -She is taking vitamin D twice daily this is not helping her levels. -I have prescribed vitamin D 50,000 units once weekly -Labs on 12/08/2019 showed vitamin D at 41.13 -We will recheck her labs in 4 months.  4.  Severe night sweats: -She has had severe soaking night sweats for a few years now. -She has to change the bed once a night. -We will order BCR ABL and JAK2 testing at her next visit. -Labs done on 12/08/2019 showed platelet count 475 WBC 12.6     Orders placed this encounter:  Orders Placed This Encounter  Procedures  . Lactate dehydrogenase  . JAK2 V617F, w Reflex to CALR/E12/MPL  . Methylmalonic acid, serum  . CBC with Differential/Platelet  . Comprehensive metabolic panel  . Ferritin  . Iron and TIBC  . Vitamin B12  . VITAMIN D 25 Hydroxy (Vit-D Deficiency, Fractures)  . Folate  . BCR-ABL1,  CML/ALL, PCR, QUANT      Francene Finders, FNP-C [facility] [phone]

## 2019-12-16 NOTE — Assessment & Plan Note (Addendum)
1.  Iron deficiency anemia: - Patient reports she does have occasional bleeding per rectum. 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 -Labs on 12/08/2019 showed potassium 3.5, creatinine 0.58 ferritin 417, percent saturation 15, hemoglobin 13.9, WBC 12.6, platelets 475 -I do not recommend any IV iron at this time. -We will see her back in 4 months with repeat labs.  2.  Severe fatigue: - Patient reports she has a family history of lupus and wishes to be tested. -Patient reports she saw a rheumatologist lupus test was negative.  3.  Vitamin D deficiency: -Labs done on 03/02/2019 showed her vitamin D level at 21.3. -She is taking vitamin D twice daily this is not helping her levels. -I have prescribed vitamin D 50,000 units once weekly -Labs on 12/08/2019 showed vitamin D at 41.13 -We will recheck her labs in 4 months.  4.  Severe night sweats: -She has had severe soaking night sweats for a few years now. -She has to change the bed once a night. -We will order BCR ABL and JAK2 testing at her next visit. -Labs done on 12/08/2019 showed platelet count 475 WBC 12.6

## 2020-02-16 DIAGNOSIS — Z01419 Encounter for gynecological examination (general) (routine) without abnormal findings: Secondary | ICD-10-CM | POA: Diagnosis not present

## 2020-02-16 DIAGNOSIS — Z6841 Body Mass Index (BMI) 40.0 and over, adult: Secondary | ICD-10-CM | POA: Diagnosis not present

## 2020-04-12 ENCOUNTER — Other Ambulatory Visit (HOSPITAL_COMMUNITY): Payer: Self-pay

## 2020-04-12 DIAGNOSIS — D500 Iron deficiency anemia secondary to blood loss (chronic): Secondary | ICD-10-CM

## 2020-04-12 DIAGNOSIS — D509 Iron deficiency anemia, unspecified: Secondary | ICD-10-CM

## 2020-04-12 DIAGNOSIS — E559 Vitamin D deficiency, unspecified: Secondary | ICD-10-CM

## 2020-04-13 ENCOUNTER — Inpatient Hospital Stay (HOSPITAL_COMMUNITY): Payer: BC Managed Care – PPO | Attending: Hematology

## 2020-04-13 ENCOUNTER — Other Ambulatory Visit: Payer: Self-pay

## 2020-04-13 DIAGNOSIS — E063 Autoimmune thyroiditis: Secondary | ICD-10-CM | POA: Diagnosis not present

## 2020-04-13 DIAGNOSIS — R5383 Other fatigue: Secondary | ICD-10-CM | POA: Diagnosis not present

## 2020-04-13 DIAGNOSIS — M2550 Pain in unspecified joint: Secondary | ICD-10-CM | POA: Diagnosis not present

## 2020-04-13 DIAGNOSIS — M545 Low back pain, unspecified: Secondary | ICD-10-CM | POA: Diagnosis not present

## 2020-04-13 DIAGNOSIS — Z820 Family history of epilepsy and other diseases of the nervous system: Secondary | ICD-10-CM | POA: Insufficient documentation

## 2020-04-13 DIAGNOSIS — Z818 Family history of other mental and behavioral disorders: Secondary | ICD-10-CM | POA: Insufficient documentation

## 2020-04-13 DIAGNOSIS — D500 Iron deficiency anemia secondary to blood loss (chronic): Secondary | ICD-10-CM

## 2020-04-13 DIAGNOSIS — Z881 Allergy status to other antibiotic agents status: Secondary | ICD-10-CM | POA: Diagnosis not present

## 2020-04-13 DIAGNOSIS — Z8349 Family history of other endocrine, nutritional and metabolic diseases: Secondary | ICD-10-CM | POA: Diagnosis not present

## 2020-04-13 DIAGNOSIS — D509 Iron deficiency anemia, unspecified: Secondary | ICD-10-CM | POA: Insufficient documentation

## 2020-04-13 DIAGNOSIS — K5900 Constipation, unspecified: Secondary | ICD-10-CM | POA: Diagnosis not present

## 2020-04-13 DIAGNOSIS — R61 Generalized hyperhidrosis: Secondary | ICD-10-CM | POA: Diagnosis not present

## 2020-04-13 DIAGNOSIS — M549 Dorsalgia, unspecified: Secondary | ICD-10-CM | POA: Diagnosis not present

## 2020-04-13 DIAGNOSIS — M353 Polymyalgia rheumatica: Secondary | ICD-10-CM | POA: Insufficient documentation

## 2020-04-13 DIAGNOSIS — Z8269 Family history of other diseases of the musculoskeletal system and connective tissue: Secondary | ICD-10-CM | POA: Insufficient documentation

## 2020-04-13 DIAGNOSIS — M791 Myalgia, unspecified site: Secondary | ICD-10-CM | POA: Insufficient documentation

## 2020-04-13 DIAGNOSIS — Z9049 Acquired absence of other specified parts of digestive tract: Secondary | ICD-10-CM | POA: Insufficient documentation

## 2020-04-13 DIAGNOSIS — Z832 Family history of diseases of the blood and blood-forming organs and certain disorders involving the immune mechanism: Secondary | ICD-10-CM | POA: Diagnosis not present

## 2020-04-13 DIAGNOSIS — Z880 Allergy status to penicillin: Secondary | ICD-10-CM | POA: Diagnosis not present

## 2020-04-13 DIAGNOSIS — R232 Flushing: Secondary | ICD-10-CM | POA: Diagnosis not present

## 2020-04-13 DIAGNOSIS — D72829 Elevated white blood cell count, unspecified: Secondary | ICD-10-CM | POA: Insufficient documentation

## 2020-04-13 DIAGNOSIS — Z836 Family history of other diseases of the respiratory system: Secondary | ICD-10-CM | POA: Insufficient documentation

## 2020-04-13 DIAGNOSIS — E559 Vitamin D deficiency, unspecified: Secondary | ICD-10-CM | POA: Insufficient documentation

## 2020-04-13 DIAGNOSIS — R635 Abnormal weight gain: Secondary | ICD-10-CM | POA: Diagnosis not present

## 2020-04-13 DIAGNOSIS — Z79899 Other long term (current) drug therapy: Secondary | ICD-10-CM | POA: Insufficient documentation

## 2020-04-13 LAB — FERRITIN: Ferritin: 334 ng/mL — ABNORMAL HIGH (ref 11–307)

## 2020-04-13 LAB — CBC WITH DIFFERENTIAL/PLATELET
Abs Immature Granulocytes: 0.17 10*3/uL — ABNORMAL HIGH (ref 0.00–0.07)
Basophils Absolute: 0.1 10*3/uL (ref 0.0–0.1)
Basophils Relative: 1 %
Eosinophils Absolute: 0.1 10*3/uL (ref 0.0–0.5)
Eosinophils Relative: 1 %
HCT: 38.7 % (ref 36.0–46.0)
Hemoglobin: 12.5 g/dL (ref 12.0–15.0)
Immature Granulocytes: 1 %
Lymphocytes Relative: 11 %
Lymphs Abs: 1.9 10*3/uL (ref 0.7–4.0)
MCH: 30.7 pg (ref 26.0–34.0)
MCHC: 32.3 g/dL (ref 30.0–36.0)
MCV: 95.1 fL (ref 80.0–100.0)
Monocytes Absolute: 0.5 10*3/uL (ref 0.1–1.0)
Monocytes Relative: 3 %
Neutro Abs: 14.1 10*3/uL — ABNORMAL HIGH (ref 1.7–7.7)
Neutrophils Relative %: 83 %
Platelets: 428 10*3/uL — ABNORMAL HIGH (ref 150–400)
RBC: 4.07 MIL/uL (ref 3.87–5.11)
RDW: 15.9 % — ABNORMAL HIGH (ref 11.5–15.5)
WBC: 16.9 10*3/uL — ABNORMAL HIGH (ref 4.0–10.5)
nRBC: 0 % (ref 0.0–0.2)

## 2020-04-13 LAB — COMPREHENSIVE METABOLIC PANEL
ALT: 32 U/L (ref 0–44)
AST: 18 U/L (ref 15–41)
Albumin: 4.1 g/dL (ref 3.5–5.0)
Alkaline Phosphatase: 67 U/L (ref 38–126)
Anion gap: 11 (ref 5–15)
BUN: 13 mg/dL (ref 6–20)
CO2: 27 mmol/L (ref 22–32)
Calcium: 9.7 mg/dL (ref 8.9–10.3)
Chloride: 100 mmol/L (ref 98–111)
Creatinine, Ser: 0.73 mg/dL (ref 0.44–1.00)
GFR calc non Af Amer: >60 mL/min (ref >60)
Glucose, Bld: 119 mg/dL — ABNORMAL HIGH (ref 70–99)
Potassium: 3.6 mmol/L (ref 3.5–5.1)
Sodium: 138 mmol/L (ref 135–145)
Total Bilirubin: 0.8 mg/dL (ref 0.3–1.2)
Total Protein: 7.6 g/dL (ref 6.5–8.1)

## 2020-04-13 LAB — VITAMIN B12: Vitamin B-12: 400 pg/mL (ref 180–914)

## 2020-04-13 LAB — IRON AND TIBC
Iron: 44 ug/dL (ref 28–170)
Saturation Ratios: 12 % (ref 10.4–31.8)
TIBC: 376 ug/dL (ref 250–450)
UIBC: 332 ug/dL

## 2020-04-13 LAB — LACTATE DEHYDROGENASE: LDH: 207 U/L — ABNORMAL HIGH (ref 98–192)

## 2020-04-13 LAB — VITAMIN D 25 HYDROXY (VIT D DEFICIENCY, FRACTURES): Vit D, 25-Hydroxy: 44.71 ng/mL (ref 30–100)

## 2020-04-13 LAB — FOLATE: Folate: 17.7 ng/mL (ref >5.9)

## 2020-04-17 LAB — METHYLMALONIC ACID, SERUM: Methylmalonic Acid, Quantitative: 185 nmol/L (ref 0–378)

## 2020-04-17 NOTE — Progress Notes (Signed)
Maureen Yates, [place] [address]   CLINIC:  Medical Oncology/Hematology  PCP:  [name], MD [place] [address] [phone]   REASON FOR VISIT: Follow-up for iron deficiency anemia   CURRENT THERAPY: Intermittent iron infusions   INTERVAL HISTORY:  Maureen Yates 39 y.o. female returns for routine follow-up for iron deficiency anemia. She reports not feeling well since her last visit. She has had weight gain, lower back pain X 3 days, drenching night sweats for several years, fatigue, no energy, insomnia, appetite change and all over joint pain. She denies an recent infections. Denies bleeding. At her last visit, NP added additional lab work d/t B symptoms (night sweats). Results are pending.   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  ANA was negative, sed rate was 28 and CRP was 13 per patient. She is on plaquenil and prednisone 10 mg daily. Symptoms have not improved.   She is "miserable". She has young children who are active and she is unable to interact with them because of her severe fatigue and pains.   Has family history of Chronic Leukemia (maternal aunt).   REVIEW OF SYSTEMS:  Review of Systems  Constitutional: Positive for appetite change, fatigue and unexpected weight change. Negative for fever.  HENT:   Negative for nosebleeds, sore throat and trouble swallowing.   Eyes: Negative.   Respiratory: Negative.  Negative for cough, shortness of breath and wheezing.   Cardiovascular: Negative.  Negative for chest pain and leg swelling.  Gastrointestinal: Positive for constipation. Negative for abdominal pain, blood in stool, diarrhea, nausea and vomiting.  Endocrine: Positive for hot flashes.  Genitourinary: Negative.  Negative for bladder incontinence, hematuria and nocturia.   Musculoskeletal: Positive for arthralgias, back pain and myalgias. Negative  for flank pain.  Skin: Negative.   Neurological: Negative.  Negative for dizziness, headaches, light-headedness and numbness.  Hematological: Negative.   Psychiatric/Behavioral: Negative for confusion. The patient is nervous/anxious.      PAST MEDICAL/SURGICAL HISTORY:  Past Medical History:  Diagnosis Date  . Anxiety   . Environmental allergies   . GERD (gastroesophageal reflux disease)   . Hashimoto's thyroiditis   . IBS (irritable bowel syndrome)   . Myofascial pain   . Tremor    right arm  . Tubal ectopic pregnancy    Past Surgical History:  Procedure Laterality Date  . CHOLECYSTECTOMY  Sept of 2012 gallstones  . COLONOSCOPY  02/27/2012   Procedure: COLONOSCOPY;  Surgeon: Rogene Houston, MD;  [facility];  Service: Endoscopy;  Laterality: N/A;  225  . eptopic pregnancy  2015  . rt ovary      removed 03/2010 for 4 pound tumor  . TUBAL LIGATION Bilateral 04/2013     SOCIAL HISTORY:  Social History   Socioeconomic History  . Marital status: Married    Spouse name: Corene Cornea  . Number of children: 2  . Years of education: Not on file  . Highest education level: Not on file  Occupational History  . Not on file  Tobacco Use  . Smoking status: Never Smoker  . Smokeless tobacco: Never Used  Vaping Use  . Vaping Use: Never used  Substance and Sexual Activity  . Alcohol use: No  . Drug use: No  . Sexual activity: Yes  Other Topics Concern  . Not on file  Social History Narrative   Lives at home  with husband and children   Right handed   Caffeine: avg of 16 oz soda daily   Social Determinants of Health   Financial Resource Strain:   . Difficulty of Paying Living Expenses: Not on file  Food Insecurity:   . Worried About Charity fundraiser in the Last Year: Not on file  . Ran Out of Food in the Last Year: Not on file  Transportation Needs:   . Lack of Transportation (Medical): Not on file  . Lack of Transportation (Non-Medical): Not on file    Physical Activity:   . Days of Exercise per Week: Not on file  . Minutes of Exercise per Session: Not on file  Stress:   . Feeling of Stress : Not on file  Social Connections:   . Frequency of Communication with Friends and Family: Not on file  . Frequency of Social Gatherings with Friends and Family: Not on file  . Attends Religious Services: Not on file  . Active Member of Clubs or Organizations: Not on file  . Attends Archivist Meetings: Not on file  . Marital Status: Not on file  Intimate Partner Violence:   . Fear of Current or Ex-Partner: Not on file  . Emotionally Abused: Not on file  . Physically Abused: Not on file  . Sexually Abused: Not on file    FAMILY HISTORY:  Family History  Problem Relation Age of Onset  . Epilepsy Mother   . Fibromyalgia Mother   . Epilepsy Brother   . Lupus Brother   . Crohn's disease Father   . COPD Father   . Graves' disease Maternal Grandmother   . Healthy Son   . Allergy (severe) Son   . Healthy Daughter   . Parkinson's disease Maternal Great-grandfather     CURRENT MEDICATIONS:  Outpatient Encounter Medications as of 04/18/2020  Medication Sig Note  . ALPRAZolam (XANAX) 0.5 MG tablet Take 0.25-0.5 mg by mouth as needed (start of tremor).   . cyclobenzaprine (FLEXERIL) 10 MG tablet Take 10 mg by mouth every 8 (eight) hours as needed.   . DULoxetine (CYMBALTA) 60 MG capsule Take 60 mg by mouth daily.    . fluconazole (DIFLUCAN) 150 MG tablet Take 150 mg by mouth every 30 (thirty) days.  10/09/2019: As needed  . ketoconazole (NIZORAL) 2 % shampoo USE AS BODY WASH AS DIRECTED   . lamoTRIgine (LAMICTAL) 100 MG tablet Take 100 mg by mouth daily.   Marland Kitchen levothyroxine (SYNTHROID) 88 MCG tablet TAKE 1 TABLET BY MOUTH ONCE DAILY BEFORE BREAKFAST   . methocarbamol (ROBAXIN) 500 MG tablet Take 500 mg by mouth 2 (two) times daily as needed.   . mupirocin ointment (BACTROBAN) 2 % APPLY TO AFFECTED AREA TWICE A DAY   . Vitamin D,  Ergocalciferol, (DRISDOL) 1.25 MG (50000 UNIT) CAPS capsule TAKE 1 CAPSULE BY MOUTH ONE TIME PER WEEK    No facility-administered encounter medications on file as of 04/18/2020.    ALLERGIES:  Allergies  Allergen Reactions  . Penicillins Rash  . Casein Other (See Comments)    G.I. Upset  . Augmentin [Amoxicillin-Pot Clavulanate] Swelling and Rash     PHYSICAL EXAM:  ECOG Performance status: 1  There were no vitals filed for this visit. There were no vitals filed for this visit. Physical Exam Constitutional:      Appearance: Normal appearance.  HENT:     Head: Normocephalic and atraumatic.  Eyes:     Pupils: Pupils are equal, round,  and reactive to light.  Cardiovascular:     Rate and Rhythm: Normal rate and regular rhythm.     Heart sounds: Normal heart sounds. No murmur heard.   Pulmonary:     Effort: Pulmonary effort is normal.     Breath sounds: Normal breath sounds. No wheezing.  Abdominal:     General: Bowel sounds are normal. There is no distension.     Palpations: Abdomen is soft.     Tenderness: There is no abdominal tenderness.  Musculoskeletal:        General: Normal range of motion.     Cervical back: Normal range of motion.  Skin:    General: Skin is warm and dry.     Findings: No rash.  Neurological:     Mental Status: She is alert and oriented to person, place, and time.  Psychiatric:        Judgment: Judgment normal.      LABORATORY DATA:  I have reviewed the labs as listed.  CBC    Component Value Date/Time   WBC 16.9 (H) 04/13/2020 1059   RBC 4.07 04/13/2020 1059   HGB 12.5 04/13/2020 1059   HCT 38.7 04/13/2020 1059   PLT 428 (H) 04/13/2020 1059   MCV 95.1 04/13/2020 1059   MCH 30.7 04/13/2020 1059   MCHC 32.3 04/13/2020 1059   RDW 15.9 (H) 04/13/2020 1059   LYMPHSABS 1.9 04/13/2020 1059   MONOABS 0.5 04/13/2020 1059   EOSABS 0.1 04/13/2020 1059   BASOSABS 0.1 04/13/2020 1059   CMP Latest Ref Rng & Units 04/13/2020 12/08/2019  07/07/2019  Glucose 70 - 99 mg/dL 119(H) 111(H) 121(H)  BUN 6 - 20 mg/dL 13 7 9   Creatinine 0.44 - 1.00 mg/dL 0.73 0.58 0.59  Sodium 135 - 145 mmol/L 138 139 138  Potassium 3.5 - 5.1 mmol/L 3.6 3.5 4.2  Chloride 98 - 111 mmol/L 100 102 103  CO2 22 - 32 mmol/L 27 26 25   Calcium 8.9 - 10.3 mg/dL 9.7 9.3 9.2  Total Protein 6.5 - 8.1 g/dL 7.6 8.1 7.4  Total Bilirubin 0.3 - 1.2 mg/dL 0.8 0.4 0.6  Alkaline Phos 38 - 126 U/L 67 89 77  AST 15 - 41 U/L 18 37 26  ALT 0 - 44 U/L 32 54(H) 38    All questions were answered to patient's stated satisfaction. Encouraged patient to call with any new concerns or questions before his next visit to the cancer center and we can certain see him sooner, if needed.     ASSESSMENT & PLAN:  Iron deficiency anemia 1.  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 -Labs on 04/13/2020 show Iron saturations 12% (15%), ferritin 334 (417), B12 400 (516), MMA 185, Vitamin D 44.71 (41.13), hemoglobin 12.5 (13.9). --Had colonoscopy (can't locate in Epic) that showed IBS.  -No additional IV iron needed at this time.  -We will see her back in 4 months with repeat labs.   2.  Severe fatigue: -Unclear etiology --ANA was negative per patient, CRP and Sed rate both normal  3.  Vitamin D deficiency: -Continue vitamin D 50,000 units once weekly improved viatmin d levels  4.  Severe night sweats: -She has had severe soaking night sweats for a few years now. -She has  to change the bed once a night. -BCR ABL and JAK2 pending at this time  5. Leukocytosis --Likely secondary to recent steroid use.  --No signs of infection  Spoke with Dr. Tera Helper and he recommends on waiting on pending labs for any additional work-up at this time. Will  call patient with results.   Disposition: RTC in 4 months for f/u with labs.   No problem-specific Assessment & Plan notes found for this encounter.  Greater than 50% was spent in counseling and coordination of care with this patient including but not limited to discussion of the relevant topics above (See A&P) including, but not limited to diagnosis and management of acute and chronic medical conditions.     Orders placed this encounter:  No orders of the defined types were placed in this encounter.   Faythe Casa, NP 04/20/2020 11:07 AM  [place] [phone]

## 2020-04-18 ENCOUNTER — Other Ambulatory Visit: Payer: Self-pay

## 2020-04-18 ENCOUNTER — Encounter (HOSPITAL_COMMUNITY): Payer: Self-pay | Admitting: Oncology

## 2020-04-18 ENCOUNTER — Inpatient Hospital Stay (HOSPITAL_COMMUNITY): Payer: BC Managed Care – PPO | Admitting: Oncology

## 2020-04-18 VITALS — BP 127/92 | HR 110 | Temp 96.9°F | Resp 20 | Wt 288.1 lb

## 2020-04-18 DIAGNOSIS — D72829 Elevated white blood cell count, unspecified: Secondary | ICD-10-CM

## 2020-04-18 DIAGNOSIS — K5900 Constipation, unspecified: Secondary | ICD-10-CM | POA: Diagnosis not present

## 2020-04-18 DIAGNOSIS — M353 Polymyalgia rheumatica: Secondary | ICD-10-CM | POA: Diagnosis not present

## 2020-04-18 DIAGNOSIS — D509 Iron deficiency anemia, unspecified: Secondary | ICD-10-CM

## 2020-04-18 DIAGNOSIS — E559 Vitamin D deficiency, unspecified: Secondary | ICD-10-CM | POA: Diagnosis not present

## 2020-04-18 DIAGNOSIS — R5383 Other fatigue: Secondary | ICD-10-CM | POA: Diagnosis not present

## 2020-04-18 DIAGNOSIS — M549 Dorsalgia, unspecified: Secondary | ICD-10-CM | POA: Diagnosis not present

## 2020-04-18 DIAGNOSIS — Z79899 Other long term (current) drug therapy: Secondary | ICD-10-CM | POA: Diagnosis not present

## 2020-04-18 DIAGNOSIS — E063 Autoimmune thyroiditis: Secondary | ICD-10-CM | POA: Diagnosis not present

## 2020-04-18 DIAGNOSIS — M545 Low back pain, unspecified: Secondary | ICD-10-CM | POA: Diagnosis not present

## 2020-04-18 DIAGNOSIS — R232 Flushing: Secondary | ICD-10-CM | POA: Diagnosis not present

## 2020-04-18 DIAGNOSIS — Z880 Allergy status to penicillin: Secondary | ICD-10-CM | POA: Diagnosis not present

## 2020-04-18 DIAGNOSIS — M2550 Pain in unspecified joint: Secondary | ICD-10-CM | POA: Diagnosis not present

## 2020-04-18 DIAGNOSIS — R635 Abnormal weight gain: Secondary | ICD-10-CM | POA: Diagnosis not present

## 2020-04-18 DIAGNOSIS — M791 Myalgia, unspecified site: Secondary | ICD-10-CM | POA: Diagnosis not present

## 2020-04-18 DIAGNOSIS — R61 Generalized hyperhidrosis: Secondary | ICD-10-CM | POA: Diagnosis not present

## 2020-04-18 NOTE — Patient Instructions (Signed)
[place] at [facility] Discharge Instructions  You saw Rulon Abide, NP, today.   Thank you for choosing [place] at [facility] to provide your oncology and hematology care.  To afford each patient quality time with our provider, please arrive at least 15 minutes before your scheduled appointment time.   If you have a lab appointment with the [facility] please come in thru the Main Entrance and check in at the main information desk.  You need to re-schedule your appointment should you arrive 10 or more minutes late.  We strive to give you quality time with our providers, and arriving late affects you and other patients whose appointments are after yours.  Also, if you no show three or more times for appointments you may be dismissed from the clinic at the providers discretion.     Again, thank you for choosing [facility].  Our hope is that these requests will decrease the amount of time that you wait before being seen by our physicians.       _____________________________________________________________  Should you have questions after your visit to [facility], please contact our office at [phone] and follow the prompts.  Our office hours are 8:00 a.m. and 4:30 p.m. Monday - Friday.  Please note that voicemails left after 4:00 p.m. may not be returned until the following business day.  We are closed weekends and major holidays.  You do have access to a nurse 24-7, just call the main number to the clinic [phone] and do not press any options, hold on the line and a nurse will answer the phone.    For prescription refill requests, have your pharmacy contact our office and allow 72 hours.    Due to Covid, you will need to wear a mask upon entering the hospital. If you do not have a mask, a mask will be given to you at the Main Entrance upon arrival. For doctor visits, patients may have 1 support person age 18  or older with them. For treatment visits, patients can not have anyone with them due to social distancing guidelines and our immunocompromised population.

## 2020-04-20 ENCOUNTER — Encounter (HOSPITAL_COMMUNITY): Payer: Self-pay | Admitting: Oncology

## 2020-04-20 LAB — BCR-ABL1, CML/ALL, PCR, QUANT: Interpretation (BCRAL):: NEGATIVE

## 2020-04-22 ENCOUNTER — Ambulatory Visit (HOSPITAL_COMMUNITY): Payer: BC Managed Care – PPO | Admitting: Oncology

## 2020-04-24 LAB — CALR + JAK2 E12-15 + MPL (REFLEXED)

## 2020-04-24 LAB — JAK2 V617F, W REFLEX TO CALR/E12/MPL

## 2020-04-29 ENCOUNTER — Encounter (HOSPITAL_COMMUNITY): Payer: Self-pay | Admitting: Oncology

## 2020-05-05 DIAGNOSIS — H9202 Otalgia, left ear: Secondary | ICD-10-CM | POA: Diagnosis not present

## 2020-05-05 DIAGNOSIS — Z8639 Personal history of other endocrine, nutritional and metabolic disease: Secondary | ICD-10-CM | POA: Diagnosis not present

## 2020-05-05 DIAGNOSIS — E039 Hypothyroidism, unspecified: Secondary | ICD-10-CM | POA: Diagnosis not present

## 2020-05-05 DIAGNOSIS — R198 Other specified symptoms and signs involving the digestive system and abdomen: Secondary | ICD-10-CM | POA: Diagnosis not present

## 2020-05-13 DIAGNOSIS — E049 Nontoxic goiter, unspecified: Secondary | ICD-10-CM | POA: Diagnosis not present

## 2020-05-13 DIAGNOSIS — E042 Nontoxic multinodular goiter: Secondary | ICD-10-CM | POA: Diagnosis not present

## 2020-05-23 ENCOUNTER — Other Ambulatory Visit: Payer: Self-pay | Admitting: "Endocrinology

## 2020-06-09 ENCOUNTER — Ambulatory Visit: Payer: BC Managed Care – PPO | Admitting: "Endocrinology

## 2020-06-23 DIAGNOSIS — M25579 Pain in unspecified ankle and joints of unspecified foot: Secondary | ICD-10-CM | POA: Diagnosis not present

## 2020-06-23 DIAGNOSIS — S92324A Nondisplaced fracture of second metatarsal bone, right foot, initial encounter for closed fracture: Secondary | ICD-10-CM | POA: Diagnosis not present

## 2020-06-23 DIAGNOSIS — M79674 Pain in right toe(s): Secondary | ICD-10-CM | POA: Diagnosis not present

## 2020-06-23 DIAGNOSIS — M79671 Pain in right foot: Secondary | ICD-10-CM | POA: Diagnosis not present

## 2020-08-22 IMAGING — MR MR HEAD W/O CM
6 of 10 series · 28 of 48 positions shown · non-contrast
Comparison: None.

CLINICAL DATA: Paresthesias

EXAM:
MRI HEAD WITHOUT CONTRAST
TECHNIQUE: Multiplanar, multiecho pulse sequences of the brain and surrounding
structures were obtained without intravenous contrast.

[Series 2: DWI · axial · 3.0mm · 0.77mm/px · z∈[-65,+89]mm · 8 of 106 slices shown (1 of 2)]
[im 1/106]
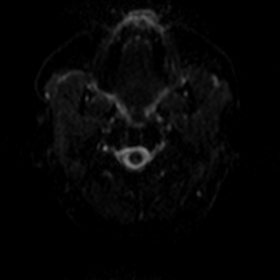
[im 12/106]
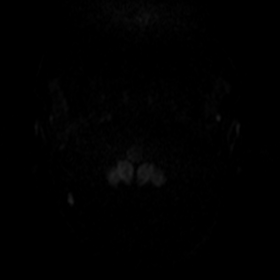
[im 36/106]
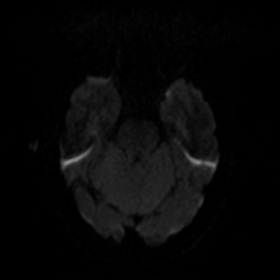
[im 47/106]
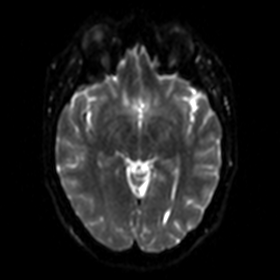
[im 59/106]
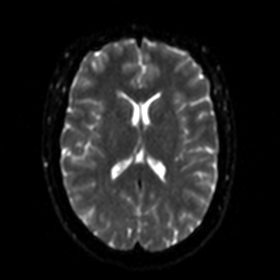
[im 71/106]
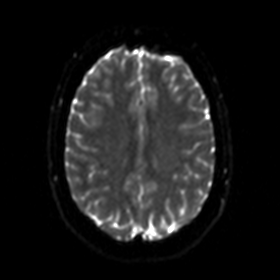
[im 94/106]
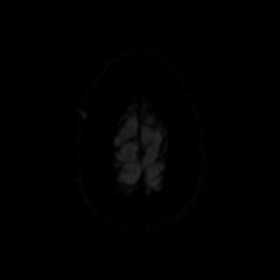
[im 106/106]
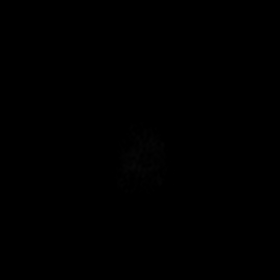

[Series 4: DWI · coronal · 5.0mm · 0.51mm/px · 7 of 76 slices shown (2 of 2)]
[im 1/76]
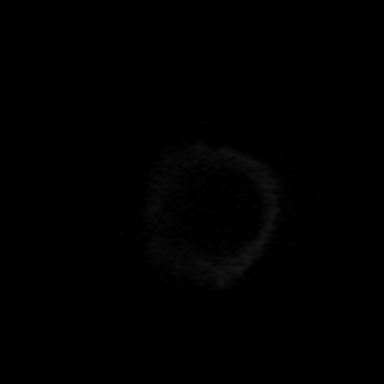
[im 13/76]
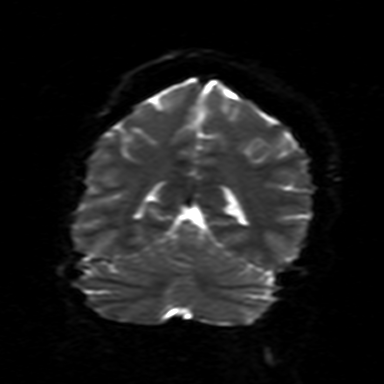
[im 26/76]
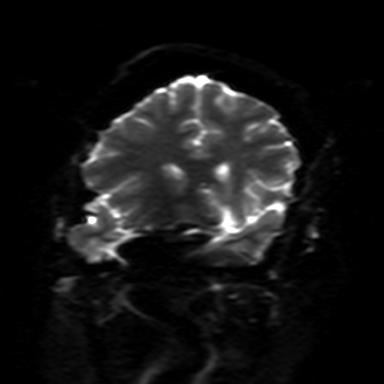
[im 38/76]
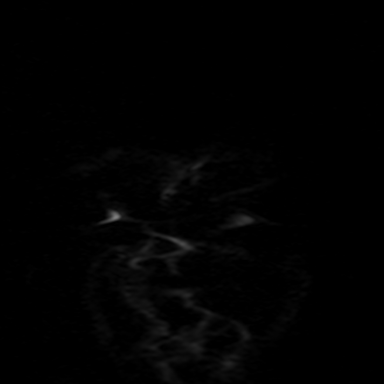
[im 51/76]
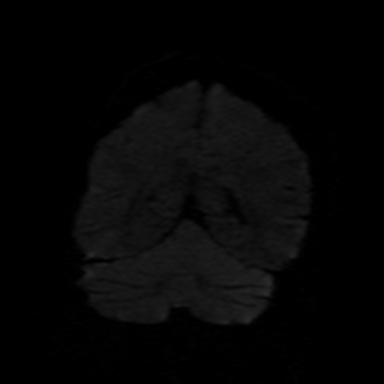
[im 63/76]
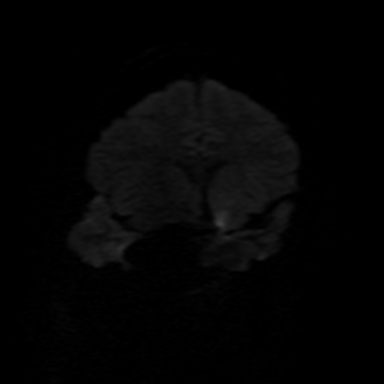
[im 76/76]
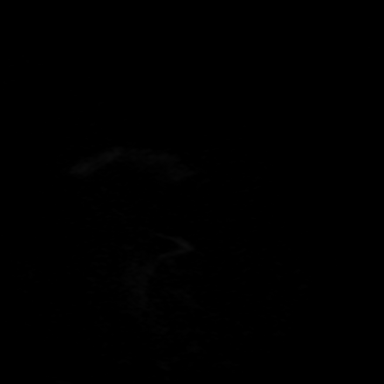

[Series 7: T2 · axial · 5.0mm · 0.48mm/px · z∈[-59,+89]mm · 2 of 24 slices shown (1 of 3)]
[im 1/24]
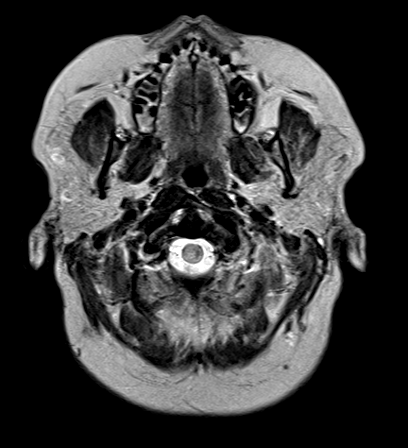
[im 24/24]
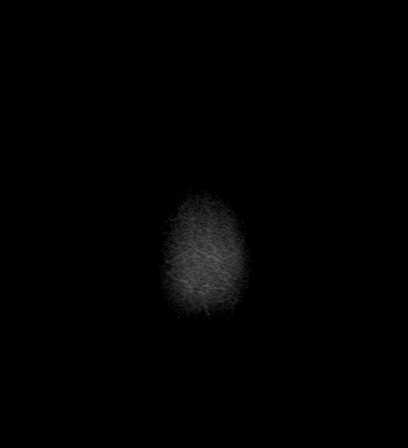

[Series 8: T2 · axial · 4.0mm · 0.42mm/px · z∈[-59,+90]mm · 3 of 31 slices shown (2 of 3)]
[im 1/31]
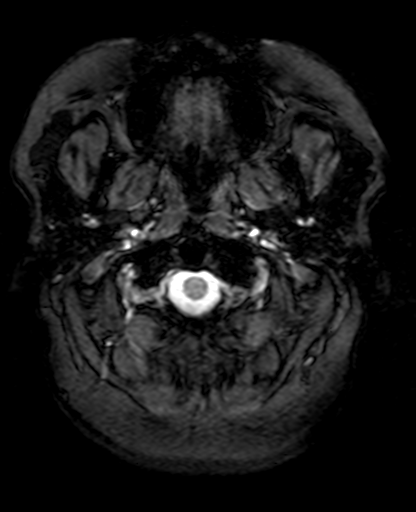
[im 16/31]
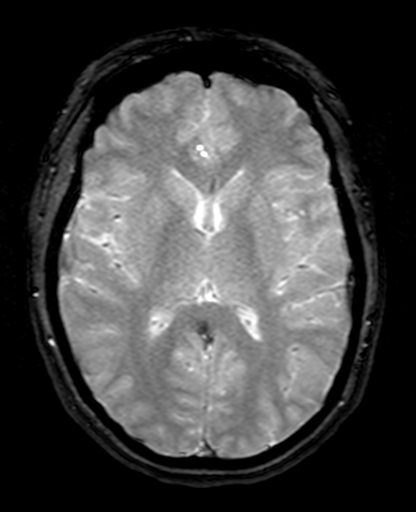
[im 31/31]
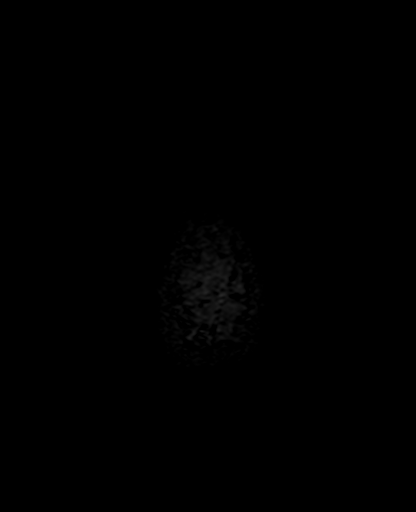

[Series 9: FLAIR · axial · 3.0mm · 0.33mm/px · z∈[-57,+86]mm · 5 of 49 slices shown]
[im 1/49]
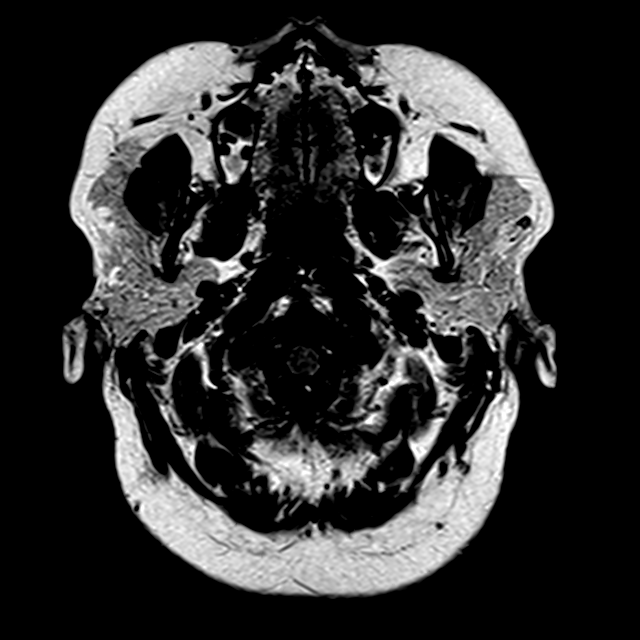
[im 13/49]
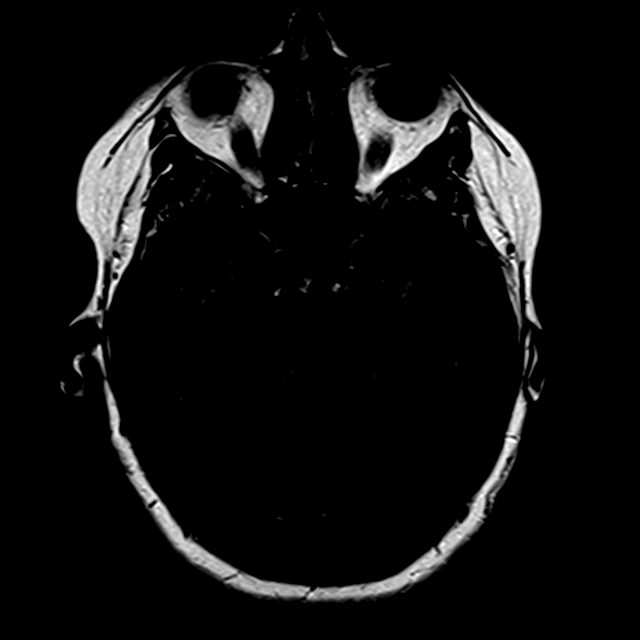
[im 25/49]
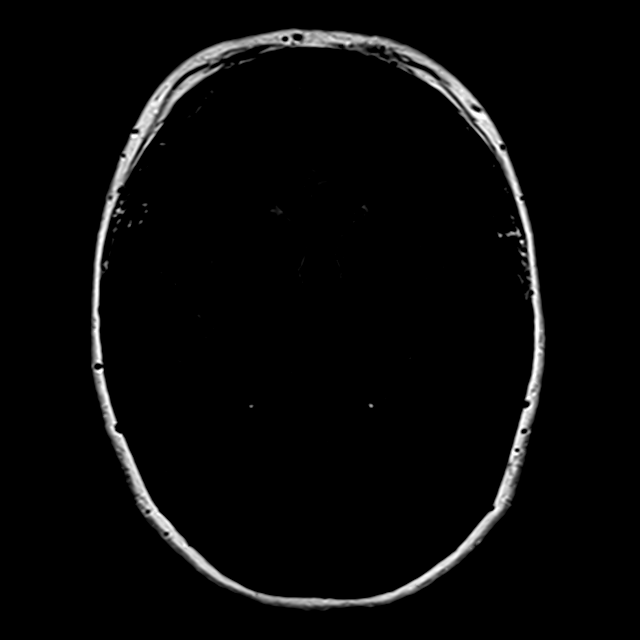
[im 37/49]
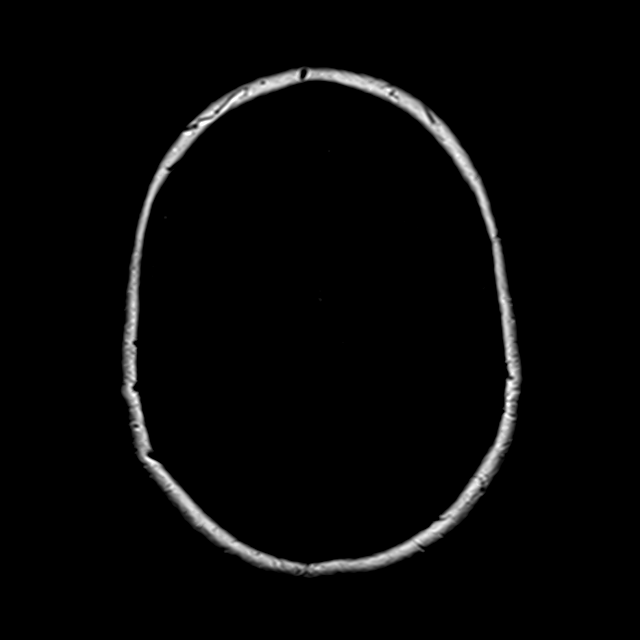
[im 49/49]
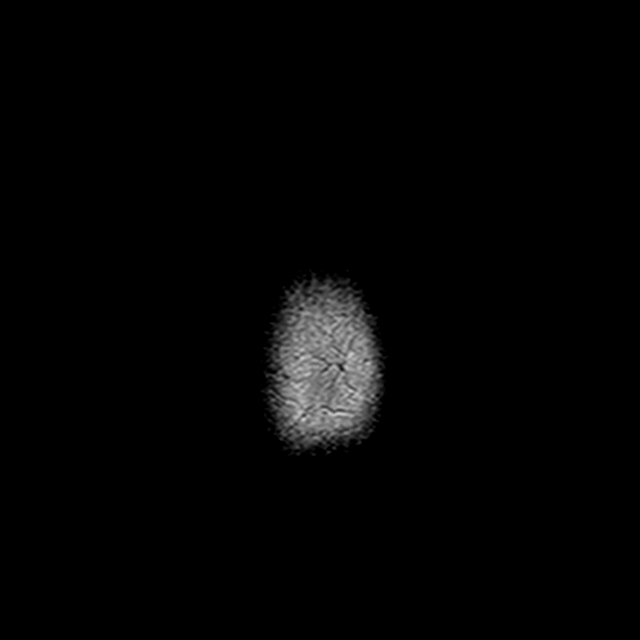

[Series 11: T2 · coronal · 5.0mm · 0.43mm/px · 3 of 28 slices shown (3 of 3)]
[im 1/28]
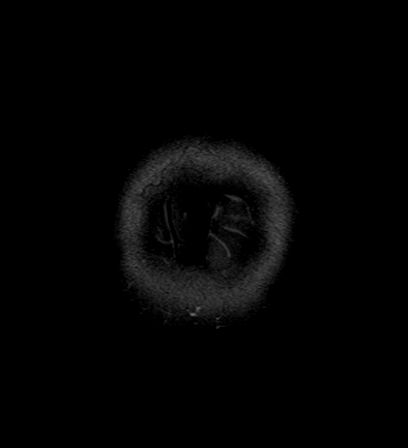
[im 14/28]
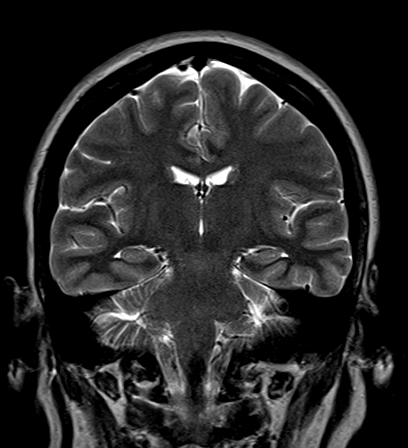
[im 28/28]
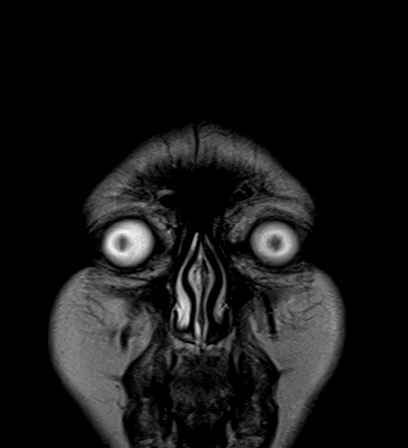

[28 of 48 positions shown; findings below may reference images not displayed]

FINDINGS: Brain: There is no acute infarction or intracranial hemorrhage.
There is no intracranial mass, mass effect, or edema. There is no
hydrocephalus or extra-axial fluid collection. Minimal small foci of
T2 hyperintensity are present in the periventricular and subcortical
supratentorial white matter.

Vascular: Major vessel flow voids at the skull base are preserved.

Skull and upper cervical spine: Normal marrow signal is preserved.

Sinuses/Orbits: Paranasal sinuses are aerated. Orbits are
unremarkable.

Other: Sella is unremarkable.  Mastoid air cells are clear.
IMPRESSION: Minimal small foci of T2 hyperintensity in the cerebral white matter
likely reflecting nonspecific gliosis/demyelination potentially of
no clinical significance. Otherwise unremarkable study.

## 2020-09-05 ENCOUNTER — Other Ambulatory Visit (HOSPITAL_COMMUNITY): Payer: Self-pay

## 2020-09-05 DIAGNOSIS — D72829 Elevated white blood cell count, unspecified: Secondary | ICD-10-CM

## 2020-09-05 DIAGNOSIS — D509 Iron deficiency anemia, unspecified: Secondary | ICD-10-CM

## 2020-09-05 DIAGNOSIS — D500 Iron deficiency anemia secondary to blood loss (chronic): Secondary | ICD-10-CM

## 2020-09-05 DIAGNOSIS — E559 Vitamin D deficiency, unspecified: Secondary | ICD-10-CM

## 2020-09-06 ENCOUNTER — Inpatient Hospital Stay (HOSPITAL_COMMUNITY): Payer: BC Managed Care – PPO | Attending: Hematology

## 2020-09-06 DIAGNOSIS — M3500 Sicca syndrome, unspecified: Secondary | ICD-10-CM | POA: Insufficient documentation

## 2020-09-06 DIAGNOSIS — E559 Vitamin D deficiency, unspecified: Secondary | ICD-10-CM | POA: Insufficient documentation

## 2020-09-06 DIAGNOSIS — M353 Polymyalgia rheumatica: Secondary | ICD-10-CM | POA: Insufficient documentation

## 2020-09-06 DIAGNOSIS — D509 Iron deficiency anemia, unspecified: Secondary | ICD-10-CM | POA: Insufficient documentation

## 2020-09-06 DIAGNOSIS — K219 Gastro-esophageal reflux disease without esophagitis: Secondary | ICD-10-CM | POA: Insufficient documentation

## 2020-09-06 DIAGNOSIS — D72829 Elevated white blood cell count, unspecified: Secondary | ICD-10-CM | POA: Insufficient documentation

## 2020-09-06 DIAGNOSIS — R21 Rash and other nonspecific skin eruption: Secondary | ICD-10-CM | POA: Insufficient documentation

## 2020-09-06 DIAGNOSIS — R61 Generalized hyperhidrosis: Secondary | ICD-10-CM | POA: Insufficient documentation

## 2020-09-06 DIAGNOSIS — R5383 Other fatigue: Secondary | ICD-10-CM | POA: Insufficient documentation

## 2020-09-06 DIAGNOSIS — K625 Hemorrhage of anus and rectum: Secondary | ICD-10-CM | POA: Insufficient documentation

## 2020-09-06 DIAGNOSIS — E063 Autoimmune thyroiditis: Secondary | ICD-10-CM | POA: Insufficient documentation

## 2020-09-06 DIAGNOSIS — Z79899 Other long term (current) drug therapy: Secondary | ICD-10-CM | POA: Insufficient documentation

## 2020-09-13 ENCOUNTER — Ambulatory Visit (HOSPITAL_COMMUNITY): Payer: BC Managed Care – PPO | Admitting: Oncology

## 2020-09-13 ENCOUNTER — Ambulatory Visit (HOSPITAL_COMMUNITY): Payer: BC Managed Care – PPO | Admitting: Hematology

## 2020-09-22 ENCOUNTER — Inpatient Hospital Stay (HOSPITAL_COMMUNITY): Payer: BC Managed Care – PPO | Attending: Hematology

## 2020-09-22 ENCOUNTER — Other Ambulatory Visit: Payer: Self-pay

## 2020-09-22 DIAGNOSIS — R002 Palpitations: Secondary | ICD-10-CM | POA: Insufficient documentation

## 2020-09-22 DIAGNOSIS — E669 Obesity, unspecified: Secondary | ICD-10-CM | POA: Diagnosis not present

## 2020-09-22 DIAGNOSIS — M3500 Sicca syndrome, unspecified: Secondary | ICD-10-CM | POA: Insufficient documentation

## 2020-09-22 DIAGNOSIS — D72829 Elevated white blood cell count, unspecified: Secondary | ICD-10-CM

## 2020-09-22 DIAGNOSIS — R21 Rash and other nonspecific skin eruption: Secondary | ICD-10-CM | POA: Insufficient documentation

## 2020-09-22 DIAGNOSIS — M353 Polymyalgia rheumatica: Secondary | ICD-10-CM | POA: Diagnosis not present

## 2020-09-22 DIAGNOSIS — M791 Myalgia, unspecified site: Secondary | ICD-10-CM | POA: Diagnosis not present

## 2020-09-22 DIAGNOSIS — R630 Anorexia: Secondary | ICD-10-CM | POA: Insufficient documentation

## 2020-09-22 DIAGNOSIS — E559 Vitamin D deficiency, unspecified: Secondary | ICD-10-CM | POA: Diagnosis not present

## 2020-09-22 DIAGNOSIS — D72828 Other elevated white blood cell count: Secondary | ICD-10-CM | POA: Insufficient documentation

## 2020-09-22 DIAGNOSIS — R5383 Other fatigue: Secondary | ICD-10-CM | POA: Diagnosis not present

## 2020-09-22 DIAGNOSIS — D509 Iron deficiency anemia, unspecified: Secondary | ICD-10-CM | POA: Diagnosis present

## 2020-09-22 DIAGNOSIS — R61 Generalized hyperhidrosis: Secondary | ICD-10-CM | POA: Insufficient documentation

## 2020-09-22 DIAGNOSIS — D500 Iron deficiency anemia secondary to blood loss (chronic): Secondary | ICD-10-CM

## 2020-09-22 LAB — CBC WITH DIFFERENTIAL/PLATELET
Abs Immature Granulocytes: 0.13 10*3/uL — ABNORMAL HIGH (ref 0.00–0.07)
Basophils Absolute: 0.1 10*3/uL (ref 0.0–0.1)
Basophils Relative: 1 %
Eosinophils Absolute: 0.2 10*3/uL (ref 0.0–0.5)
Eosinophils Relative: 1 %
HCT: 41.1 % (ref 36.0–46.0)
Hemoglobin: 13.2 g/dL (ref 12.0–15.0)
Immature Granulocytes: 1 %
Lymphocytes Relative: 15 %
Lymphs Abs: 2.2 10*3/uL (ref 0.7–4.0)
MCH: 30.3 pg (ref 26.0–34.0)
MCHC: 32.1 g/dL (ref 30.0–36.0)
MCV: 94.3 fL (ref 80.0–100.0)
Monocytes Absolute: 0.6 10*3/uL (ref 0.1–1.0)
Monocytes Relative: 4 %
Neutro Abs: 11.6 10*3/uL — ABNORMAL HIGH (ref 1.7–7.7)
Neutrophils Relative %: 78 %
Platelets: 429 10*3/uL — ABNORMAL HIGH (ref 150–400)
RBC: 4.36 MIL/uL (ref 3.87–5.11)
RDW: 15.5 % (ref 11.5–15.5)
WBC: 14.8 10*3/uL — ABNORMAL HIGH (ref 4.0–10.5)
nRBC: 0 % (ref 0.0–0.2)

## 2020-09-22 LAB — COMPREHENSIVE METABOLIC PANEL
ALT: 54 U/L — ABNORMAL HIGH (ref 0–44)
AST: 22 U/L (ref 15–41)
Albumin: 3.8 g/dL (ref 3.5–5.0)
Alkaline Phosphatase: 77 U/L (ref 38–126)
Anion gap: 10 (ref 5–15)
BUN: 10 mg/dL (ref 6–20)
CO2: 27 mmol/L (ref 22–32)
Calcium: 9.2 mg/dL (ref 8.9–10.3)
Chloride: 103 mmol/L (ref 98–111)
Creatinine, Ser: 0.58 mg/dL (ref 0.44–1.00)
GFR, Estimated: >60 mL/min (ref >60)
Glucose, Bld: 122 mg/dL — ABNORMAL HIGH (ref 70–99)
Potassium: 3.3 mmol/L — ABNORMAL LOW (ref 3.5–5.1)
Sodium: 140 mmol/L (ref 135–145)
Total Bilirubin: 0.3 mg/dL (ref 0.3–1.2)
Total Protein: 7.2 g/dL (ref 6.5–8.1)

## 2020-09-22 LAB — FOLATE: Folate: 12 ng/mL (ref >5.9)

## 2020-09-22 LAB — IRON AND TIBC
Iron: 40 ug/dL (ref 28–170)
Saturation Ratios: 11 % (ref 10.4–31.8)
TIBC: 357 ug/dL (ref 250–450)
UIBC: 317 ug/dL

## 2020-09-22 LAB — VITAMIN B12: Vitamin B-12: 350 pg/mL (ref 180–914)

## 2020-09-22 LAB — FERRITIN: Ferritin: 268 ng/mL (ref 11–307)

## 2020-09-22 LAB — LACTATE DEHYDROGENASE: LDH: 169 U/L (ref 98–192)

## 2020-09-22 LAB — VITAMIN D 25 HYDROXY (VIT D DEFICIENCY, FRACTURES): Vit D, 25-Hydroxy: 46.5 ng/mL (ref 30–100)

## 2020-09-27 ENCOUNTER — Other Ambulatory Visit: Payer: Self-pay

## 2020-09-27 ENCOUNTER — Inpatient Hospital Stay (HOSPITAL_COMMUNITY): Payer: BC Managed Care – PPO | Admitting: Hematology

## 2020-09-27 VITALS — BP 123/72 | HR 110 | Temp 97.4°F | Resp 18 | Wt 274.9 lb

## 2020-09-27 DIAGNOSIS — R61 Generalized hyperhidrosis: Secondary | ICD-10-CM | POA: Diagnosis not present

## 2020-09-27 DIAGNOSIS — D509 Iron deficiency anemia, unspecified: Secondary | ICD-10-CM | POA: Diagnosis present

## 2020-09-27 DIAGNOSIS — Z79899 Other long term (current) drug therapy: Secondary | ICD-10-CM | POA: Diagnosis not present

## 2020-09-27 DIAGNOSIS — M3500 Sicca syndrome, unspecified: Secondary | ICD-10-CM | POA: Diagnosis not present

## 2020-09-27 DIAGNOSIS — K625 Hemorrhage of anus and rectum: Secondary | ICD-10-CM | POA: Diagnosis not present

## 2020-09-27 DIAGNOSIS — R21 Rash and other nonspecific skin eruption: Secondary | ICD-10-CM | POA: Diagnosis not present

## 2020-09-27 DIAGNOSIS — M353 Polymyalgia rheumatica: Secondary | ICD-10-CM | POA: Diagnosis not present

## 2020-09-27 DIAGNOSIS — R5383 Other fatigue: Secondary | ICD-10-CM | POA: Diagnosis not present

## 2020-09-27 DIAGNOSIS — E063 Autoimmune thyroiditis: Secondary | ICD-10-CM | POA: Diagnosis not present

## 2020-09-27 DIAGNOSIS — D72829 Elevated white blood cell count, unspecified: Secondary | ICD-10-CM | POA: Diagnosis not present

## 2020-09-27 DIAGNOSIS — K219 Gastro-esophageal reflux disease without esophagitis: Secondary | ICD-10-CM | POA: Diagnosis not present

## 2020-09-27 DIAGNOSIS — E559 Vitamin D deficiency, unspecified: Secondary | ICD-10-CM | POA: Diagnosis not present

## 2020-09-27 NOTE — Progress Notes (Signed)
[place], [place] [id number]   CLINIC:  Medical Oncology/Hematology  PCP:  [name], MD [address] [place] [address]  [phone]  REASON FOR VISIT:  Follow-up for IDA  PRIOR THERAPY: None  CURRENT THERAPY: Intermittent Feraheme last on 03/23/2019  INTERVAL HISTORY:  Ms. Tanesia Butner, a 40 y.o. female, returns for routine follow-up for her IDA. Tawn was last seen by Faythe Casa, NP, on 04/18/2020.  Today she reports feeling okay. Her energy levels have decreased for the past 1 month. She is going to see a rheumatologist and she was diagnosed with Sjorgren syndrome and polymyalgia rheumatica. She continues having night sweats every night. She is taking vitamin D once a week. She has never had cardiac issues but continues to have occasional flutters in her chest. She continues having an ongoing yeast rash on her abdomen.    REVIEW OF SYSTEMS:  Review of Systems  Constitutional: Positive for appetite change (75%), diaphoresis (night sweats) and fatigue (50%).  Cardiovascular: Positive for palpitations.  Musculoskeletal: Positive for myalgias (4/10 dull ache all over).  Skin: Positive for rash.  All other systems reviewed and are negative.   PAST MEDICAL/SURGICAL HISTORY:  Past Medical History:  Diagnosis Date  . Anxiety   . Environmental allergies   . GERD (gastroesophageal reflux disease)   . Hashimoto's thyroiditis   . IBS (irritable bowel syndrome)   . Myofascial pain   . Tremor    right arm  . Tubal ectopic pregnancy    Past Surgical History:  Procedure Laterality Date  . CHOLECYSTECTOMY  Sept of 2012 gallstones  . COLONOSCOPY  02/27/2012   Procedure: COLONOSCOPY;  Surgeon: Rogene Houston, MD;  [facility]: [facility];  Service: Endoscopy;  Laterality: N/A;  225  . eptopic pregnancy  2015  . rt ovary      removed 03/2010 for 4 pound tumor  . TUBAL LIGATION Bilateral 04/2013    SOCIAL HISTORY:   Social History   Socioeconomic History  . Marital status: Married    Spouse name: Corene Cornea  . Number of children: 2  . Years of education: Not on file  . Highest education level: Not on file  Occupational History  . Not on file  Tobacco Use  . Smoking status: Never Smoker  . Smokeless tobacco: Never Used  Vaping Use  . Vaping Use: Never used  Substance and Sexual Activity  . Alcohol use: No  . Drug use: No  . Sexual activity: Yes  Other Topics Concern  . Not on file  Social History Narrative   Lives at home with husband and children   Right handed   Caffeine: avg of 16 oz soda daily   Social Determinants of Health   Financial Resource Strain: Not on file  Food Insecurity: Not on file  Transportation Needs: Not on file  Physical Activity: Not on file  Stress: Not on file  Social Connections: Not on file  Intimate Partner Violence: Not on file    FAMILY HISTORY:  Family History  Problem Relation Age of Onset  . Epilepsy Mother   . Fibromyalgia Mother   . Epilepsy Brother   . Lupus Brother   . Crohn's disease Father   . COPD Father   . Graves' disease Maternal Grandmother   . Healthy Son   . Allergy (severe) Son   . Healthy Daughter   . Parkinson's disease Maternal Great-grandfather     CURRENT  MEDICATIONS:  Current Outpatient Medications  Medication Sig Dispense Refill  . ALPRAZolam (XANAX) 0.5 MG tablet Take 0.25-0.5 mg by mouth as needed (start of tremor).    . DULoxetine (CYMBALTA) 60 MG capsule Take 60 mg by mouth daily.     . fluconazole (DIFLUCAN) 150 MG tablet Take 150 mg by mouth every 30 (thirty) days.     . hydroxychloroquine (PLAQUENIL) 200 MG tablet Take 200 mg by mouth daily.    Marland Kitchen ketoconazole (NIZORAL) 2 % shampoo USE AS BODY WASH AS DIRECTED    . lamoTRIgine (LAMICTAL) 100 MG tablet Take 100 mg by mouth daily.    Marland Kitchen levothyroxine (SYNTHROID) 88 MCG tablet TAKE 1 TABLET BY MOUTH ONCE DAILY BEFORE BREAKFAST 90 tablet 1  . mupirocin ointment  (BACTROBAN) 2 % APPLY TO AFFECTED AREA TWICE A DAY    . Vitamin D, Ergocalciferol, (DRISDOL) 1.25 MG (50000 UNIT) CAPS capsule TAKE 1 CAPSULE BY MOUTH ONE TIME PER WEEK 30 capsule 1   No current facility-administered medications for this visit.    ALLERGIES:  Allergies  Allergen Reactions  . Penicillins Rash  . Casein Other (See Comments)    G.I. Upset  . Augmentin [Amoxicillin-Pot Clavulanate] Swelling and Rash    PHYSICAL EXAM:  Performance status (ECOG): 1 - Symptomatic but completely ambulatory  Vitals:   09/27/20 1411  BP: 123/72  Pulse: (!) 110  Resp: 18  Temp: (!) 97.4 F (36.3 C)  SpO2: 99%   Wt Readings from Last 3 Encounters:  09/27/20 274 lb 14.6 oz (124.7 kg)  04/18/20 288 lb 2.3 oz (130.7 kg)  12/16/19 279 lb 6.4 oz (126.7 kg)   Physical Exam Vitals reviewed.  Constitutional:      Appearance: Normal appearance. She is obese.  Cardiovascular:     Rate and Rhythm: Normal rate and regular rhythm.     Pulses: Normal pulses.     Heart sounds: Normal heart sounds.  Pulmonary:     Effort: Pulmonary effort is normal.     Breath sounds: Normal breath sounds.  Chest:  Breasts:     Right: No axillary adenopathy or supraclavicular adenopathy.     Left: No axillary adenopathy or supraclavicular adenopathy.    Abdominal:     Palpations: Abdomen is soft. There is no splenomegaly or mass.     Tenderness: There is no abdominal tenderness.     Hernia: No hernia is present.  Lymphadenopathy:     Cervical: No cervical adenopathy.     Upper Body:     Right upper body: No supraclavicular, axillary or pectoral adenopathy.     Left upper body: No supraclavicular, axillary or pectoral adenopathy.  Neurological:     General: No focal deficit present.     Mental Status: She is alert and oriented to person, place, and time.  Psychiatric:        Mood and Affect: Mood normal.        Behavior: Behavior normal.     LABORATORY DATA:  I have reviewed the labs as listed.   CBC Latest Ref Rng & Units 09/22/2020 04/13/2020 12/08/2019  WBC 4.0 - 10.5 K/uL 14.8(H) 16.9(H) 12.6(H)  Hemoglobin 12.0 - 15.0 g/dL 13.2 12.5 13.9  Hematocrit 36.0 - 46.0 % 41.1 38.7 42.9  Platelets 150 - 400 K/uL 429(H) 428(H) 475(H)   CMP Latest Ref Rng & Units 09/22/2020 04/13/2020 12/08/2019  Glucose 70 - 99 mg/dL 122(H) 119(H) 111(H)  BUN 6 - 20 mg/dL 10 13 7   Creatinine  0.44 - 1.00 mg/dL 0.58 0.73 0.58  Sodium 135 - 145 mmol/L 140 138 139  Potassium 3.5 - 5.1 mmol/L 3.3(L) 3.6 3.5  Chloride 98 - 111 mmol/L 103 100 102  CO2 22 - 32 mmol/L 27 27 26   Calcium 8.9 - 10.3 mg/dL 9.2 9.7 9.3  Total Protein 6.5 - 8.1 g/dL 7.2 7.6 8.1  Total Bilirubin 0.3 - 1.2 mg/dL 0.3 0.8 0.4  Alkaline Phos 38 - 126 U/L 77 67 89  AST 15 - 41 U/L 22 18 37  ALT 0 - 44 U/L 54(H) 32 54(H)      Component Value Date/Time   RBC 4.36 09/22/2020 1259   MCV 94.3 09/22/2020 1259   MCH 30.3 09/22/2020 1259   MCHC 32.1 09/22/2020 1259   RDW 15.5 09/22/2020 1259   LYMPHSABS 2.2 09/22/2020 1259   MONOABS 0.6 09/22/2020 1259   EOSABS 0.2 09/22/2020 1259   BASOSABS 0.1 09/22/2020 1259   Lab Results  Component Value Date   LDH 169 09/22/2020   LDH 207 (H) 04/13/2020   LDH 185 12/08/2019   Lab Results  Component Value Date   VD25OH 46.50 09/22/2020   VD25OH 44.71 04/13/2020   VD25OH 41.13 12/08/2019   Lab Results  Component Value Date   TIBC 357 09/22/2020   TIBC 376 04/13/2020   TIBC 377 12/08/2019   FERRITIN 268 09/22/2020   FERRITIN 334 (H) 04/13/2020   FERRITIN 417 (H) 12/08/2019   IRONPCTSAT 11 09/22/2020   IRONPCTSAT 12 04/13/2020   IRONPCTSAT 15 12/08/2019    DIAGNOSTIC IMAGING:  I have independently reviewed the scans and discussed with the patient. No results found.   ASSESSMENT:  1.  Iron deficiency anemia: - Patient reports she does have occasional bleeding per rectum. 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 -Last Feraheme on 03/13/2019 on 03/23/2019.  2.  Severe fatigue: -She was evaluated by rheumatology and was diagnosed with polymyalgia rheumatica and Sjogren's syndrome.     PLAN:  1.  Iron deficiency anemia: -We reviewed labs from 09/22/2020.  Ferritin is 268, percent saturation is 11.  Hemoglobin is normal at 13.2. -No parenteral iron therapy needed.  RTC 6 months with repeat labs.  2.  Reactive leukocytosis: -Labs from 09/22/2020 shows white count 14.8 with elevated absolute neutrophil count. -Work-up for JAK2 V617F and BCR/ABL was negative on 04/13/2020. -She was diagnosed with Sjogren's syndrome and polymyalgia rheumatica which could be the etiology for her reactive leukocytosis.  3.  Vitamin D deficiency: -Vitamin D level is 46.  Continue vitamin D weekly.    Orders placed this encounter:  No orders of the defined types were placed in this encounter.    Derek Jack, MD [place] [phone]   I, Milinda Antis, am acting as a scribe for Dr. Sanda Linger.  I, Derek Jack MD, have reviewed the above documentation for accuracy and completeness, and I agree with the above.

## 2020-09-27 NOTE — Patient Instructions (Signed)
[place] at [facility] Discharge Instructions  You were seen today by Dr. Delton Coombes. He went over your recent results; your leukemia mutations were all negative. Dr. Delton Coombes will see you back in 6 months for labs and follow up.   Thank you for choosing [place] at [facility] to provide your oncology and hematology care.  To afford each patient quality time with our provider, please arrive at least 15 minutes before your scheduled appointment time.   If you have a lab appointment with the [place] please come in thru the Main Entrance and check in at the main information desk  You need to re-schedule your appointment should you arrive 10 or more minutes late.  We strive to give you quality time with our providers, and arriving late affects you and other patients whose appointments are after yours.  Also, if you no show three or more times for appointments you may be dismissed from the clinic at the providers discretion.     Again, thank you for choosing [facility].  Our hope is that these requests will decrease the amount of time that you wait before being seen by our physicians.       _____________________________________________________________  Should you have questions after your visit to [facility], please contact our office at [phone] between the hours of 8:00 a.m. and 4:30 p.m.  Voicemails left after 4:00 p.m. will not be returned until the following business day.  For prescription refill requests, have your pharmacy contact our office and allow 72 hours.    Cancer Center Support Programs:   > Cancer Support Group  2nd Tuesday of the month 1pm-2pm, Journey Room

## 2020-11-16 ENCOUNTER — Other Ambulatory Visit: Payer: Self-pay | Admitting: "Endocrinology

## 2020-11-16 ENCOUNTER — Other Ambulatory Visit (HOSPITAL_COMMUNITY): Payer: Self-pay | Admitting: *Deleted

## 2020-11-16 DIAGNOSIS — E559 Vitamin D deficiency, unspecified: Secondary | ICD-10-CM

## 2020-11-16 MED ORDER — VITAMIN D (ERGOCALCIFEROL) 1.25 MG (50000 UNIT) PO CAPS: ORAL_CAPSULE | ORAL | 1 refills | Status: DC

## 2021-03-24 ENCOUNTER — Inpatient Hospital Stay (HOSPITAL_COMMUNITY): Payer: BC Managed Care – PPO

## 2021-03-27 ENCOUNTER — Other Ambulatory Visit (HOSPITAL_COMMUNITY): Payer: BC Managed Care – PPO

## 2021-03-29 ENCOUNTER — Inpatient Hospital Stay (HOSPITAL_COMMUNITY): Payer: BC Managed Care – PPO | Attending: Hematology

## 2021-03-29 ENCOUNTER — Other Ambulatory Visit: Payer: Self-pay

## 2021-03-29 DIAGNOSIS — M3500 Sicca syndrome, unspecified: Secondary | ICD-10-CM | POA: Insufficient documentation

## 2021-03-29 DIAGNOSIS — E063 Autoimmune thyroiditis: Secondary | ICD-10-CM | POA: Diagnosis not present

## 2021-03-29 DIAGNOSIS — M353 Polymyalgia rheumatica: Secondary | ICD-10-CM | POA: Diagnosis not present

## 2021-03-29 DIAGNOSIS — K219 Gastro-esophageal reflux disease without esophagitis: Secondary | ICD-10-CM | POA: Diagnosis not present

## 2021-03-29 DIAGNOSIS — Z79899 Other long term (current) drug therapy: Secondary | ICD-10-CM | POA: Diagnosis not present

## 2021-03-29 DIAGNOSIS — E559 Vitamin D deficiency, unspecified: Secondary | ICD-10-CM | POA: Insufficient documentation

## 2021-03-29 DIAGNOSIS — K589 Irritable bowel syndrome without diarrhea: Secondary | ICD-10-CM | POA: Insufficient documentation

## 2021-03-29 DIAGNOSIS — D72828 Other elevated white blood cell count: Secondary | ICD-10-CM | POA: Diagnosis not present

## 2021-03-29 DIAGNOSIS — D509 Iron deficiency anemia, unspecified: Secondary | ICD-10-CM | POA: Insufficient documentation

## 2021-03-29 DIAGNOSIS — R519 Headache, unspecified: Secondary | ICD-10-CM | POA: Insufficient documentation

## 2021-03-29 LAB — CBC WITH DIFFERENTIAL/PLATELET
Abs Immature Granulocytes: 0.08 10*3/uL — ABNORMAL HIGH (ref 0.00–0.07)
Basophils Absolute: 0.1 10*3/uL (ref 0.0–0.1)
Basophils Relative: 1 %
Eosinophils Absolute: 0.1 10*3/uL (ref 0.0–0.5)
Eosinophils Relative: 1 %
HCT: 37.3 % (ref 36.0–46.0)
Hemoglobin: 11.8 g/dL — ABNORMAL LOW (ref 12.0–15.0)
Immature Granulocytes: 1 %
Lymphocytes Relative: 18 %
Lymphs Abs: 2.2 10*3/uL (ref 0.7–4.0)
MCH: 29.6 pg (ref 26.0–34.0)
MCHC: 31.6 g/dL (ref 30.0–36.0)
MCV: 93.7 fL (ref 80.0–100.0)
Monocytes Absolute: 0.4 10*3/uL (ref 0.1–1.0)
Monocytes Relative: 3 %
Neutro Abs: 9.1 10*3/uL — ABNORMAL HIGH (ref 1.7–7.7)
Neutrophils Relative %: 76 %
Platelets: 362 10*3/uL (ref 150–400)
RBC: 3.98 MIL/uL (ref 3.87–5.11)
RDW: 15.1 % (ref 11.5–15.5)
WBC: 12 10*3/uL — ABNORMAL HIGH (ref 4.0–10.5)
nRBC: 0 % (ref 0.0–0.2)

## 2021-03-29 LAB — IRON AND TIBC
Iron: 49 ug/dL (ref 28–170)
Saturation Ratios: 13 % (ref 10.4–31.8)
TIBC: 388 ug/dL (ref 250–450)
UIBC: 339 ug/dL

## 2021-03-29 LAB — FERRITIN: Ferritin: 145 ng/mL (ref 11–307)

## 2021-03-29 LAB — SEDIMENTATION RATE: Sed Rate: 33 mm/hr — ABNORMAL HIGH (ref 0–22)

## 2021-03-29 LAB — VITAMIN D 25 HYDROXY (VIT D DEFICIENCY, FRACTURES): Vit D, 25-Hydroxy: 62.32 ng/mL (ref 30–100)

## 2021-03-29 LAB — C-REACTIVE PROTEIN: CRP: 1.3 mg/dL — ABNORMAL HIGH (ref <1.0)

## 2021-03-30 ENCOUNTER — Other Ambulatory Visit (HOSPITAL_COMMUNITY): Payer: BC Managed Care – PPO

## 2021-04-05 NOTE — Progress Notes (Signed)
Maureen Yates, [place] [address]   CLINIC:  Medical Oncology/Hematology  PCP:  [name], MD [address] [place] [address]  [phone]  REASON FOR VISIT:  Follow-up for IDA  PRIOR THERAPY: none  CURRENT THERAPY: Intermittent Feraheme last on 03/23/2019  INTERVAL HISTORY:  Maureen Yates, a 40 y.o. female, returns for routine follow-up for her IDA. Maureen Yates was last seen on 09/27/2020.  Today she reports feeling good. She reports her energy levels are about 75% with respect to her baseline. She has previously taken iron tablets but stopped when they did not help. She reports severe headaches starting 2 months ago. She denies any previous history of migraines. She has light menses every other month.   REVIEW OF SYSTEMS:  Review of Systems  Constitutional:  Negative for appetite change and fatigue (75%).  Gastrointestinal:  Positive for nausea.  Musculoskeletal:  Positive for arthralgias (6/10).  Neurological:  Positive for headaches and numbness (arms, hands, feet, toes).  All other systems reviewed and are negative.  PAST MEDICAL/SURGICAL HISTORY:  Past Medical History:  Diagnosis Date   Anxiety    Environmental allergies    GERD (gastroesophageal reflux disease)    Hashimoto's thyroiditis    IBS (irritable bowel syndrome)    Myofascial pain    Tremor    right arm   Tubal ectopic pregnancy    Past Surgical History:  Procedure Laterality Date   CHOLECYSTECTOMY  Sept of 2012 gallstones   COLONOSCOPY  02/27/2012   Procedure: COLONOSCOPY;  Surgeon: Rogene Houston, MD;  [facility];  Service: Endoscopy;  Laterality: N/A;  225   eptopic pregnancy  2015   rt ovary      removed 03/2010 for 4 pound tumor   TUBAL LIGATION Bilateral 04/2013    SOCIAL HISTORY:  Social History   Socioeconomic History   Marital status: Married    Spouse name: Corene Cornea   Number of children: 2   Years of education: Not on file    Highest education level: Not on file  Occupational History   Not on file  Tobacco Use   Smoking status: Never   Smokeless tobacco: Never  Vaping Use   Vaping Use: Never used  Substance and Sexual Activity   Alcohol use: No   Drug use: No   Sexual activity: Yes  Other Topics Concern   Not on file  Social History Narrative   Lives at home with husband and children   Right handed   Caffeine: avg of 16 oz soda daily   Social Determinants of Health   Financial Resource Strain: Not on file  Food Insecurity: Not on file  Transportation Needs: Not on file  Physical Activity: Not on file  Stress: Not on file  Social Connections: Not on file  Intimate Partner Violence: Not on file    FAMILY HISTORY:  Family History  Problem Relation Age of Onset   Epilepsy Mother    Fibromyalgia Mother    Epilepsy Brother    Lupus Brother    Crohn's disease Father    COPD Father    Graves' disease Maternal Grandmother    Healthy Son    Allergy (severe) Son    Healthy Daughter    Parkinson's disease Maternal Great-grandfather     CURRENT MEDICATIONS:  Current Outpatient Medications  Medication Sig Dispense Refill   ALPRAZolam (XANAX) 0.5 MG tablet Take 0.25-0.5 mg by mouth as needed (start  of tremor).     DULoxetine (CYMBALTA) 60 MG capsule Take 60 mg by mouth daily.      fluconazole (DIFLUCAN) 150 MG tablet Take 150 mg by mouth every 30 (thirty) days.      hydroxychloroquine (PLAQUENIL) 200 MG tablet Take 200 mg by mouth daily.     ketoconazole (NIZORAL) 2 % shampoo USE AS BODY WASH AS DIRECTED     lamoTRIgine (LAMICTAL) 100 MG tablet Take 100 mg by mouth daily.     levothyroxine (SYNTHROID) 88 MCG tablet TAKE 1 TABLET BY MOUTH ONCE DAILY BEFORE BREAKFAST 90 tablet 1   mupirocin ointment (BACTROBAN) 2 % APPLY TO AFFECTED AREA TWICE A DAY     Vitamin D, Ergocalciferol, (DRISDOL) 1.25 MG (50000 UNIT) CAPS capsule TAKE 1 CAPSULE BY MOUTH ONE TIME PER WEEK 30 capsule 1   No current  facility-administered medications for this visit.    ALLERGIES:  Allergies  Allergen Reactions   Penicillins Rash   Casein Other (See Comments)    G.I. Upset   Augmentin [Amoxicillin-Pot Clavulanate] Swelling and Rash    PHYSICAL EXAM:  Performance status (ECOG): 1 - Symptomatic but completely ambulatory  There were no vitals filed for this visit. Wt Readings from Last 3 Encounters:  09/27/20 274 lb 14.6 oz (124.7 kg)  04/18/20 288 lb 2.3 oz (130.7 kg)  12/16/19 279 lb 6.4 oz (126.7 kg)   Physical Exam Vitals reviewed.  Constitutional:      Appearance: Normal appearance.  Cardiovascular:     Rate and Rhythm: Normal rate and regular rhythm.     Pulses: Normal pulses.     Heart sounds: Normal heart sounds.  Pulmonary:     Effort: Pulmonary effort is normal.     Breath sounds: Normal breath sounds.  Neurological:     General: No focal deficit present.     Mental Status: She is alert and oriented to person, place, and time.  Psychiatric:        Mood and Affect: Mood normal.        Behavior: Behavior normal.    LABORATORY DATA:  I have reviewed the labs as listed.  CBC Latest Ref Rng & Units 03/29/2021 09/22/2020 04/13/2020  WBC 4.0 - 10.5 K/uL 12.0(H) 14.8(H) 16.9(H)  Hemoglobin 12.0 - 15.0 g/dL 11.8(L) 13.2 12.5  Hematocrit 36.0 - 46.0 % 37.3 41.1 38.7  Platelets 150 - 400 K/uL 362 429(H) 428(H)   CMP Latest Ref Rng & Units 09/22/2020 04/13/2020 12/08/2019  Glucose 70 - 99 mg/dL 122(H) 119(H) 111(H)  BUN 6 - 20 mg/dL 10 13 7   Creatinine 0.44 - 1.00 mg/dL 0.58 0.73 0.58  Sodium 135 - 145 mmol/L 140 138 139  Potassium 3.5 - 5.1 mmol/L 3.3(L) 3.6 3.5  Chloride 98 - 111 mmol/L 103 100 102  CO2 22 - 32 mmol/L 27 27 26   Calcium 8.9 - 10.3 mg/dL 9.2 9.7 9.3  Total Protein 6.5 - 8.1 g/dL 7.2 7.6 8.1  Total Bilirubin 0.3 - 1.2 mg/dL 0.3 0.8 0.4  Alkaline Phos 38 - 126 U/L 77 67 89  AST 15 - 41 U/L 22 18 37  ALT 0 - 44 U/L 54(H) 32 54(H)      Component Value Date/Time    RBC 3.98 03/29/2021 1038   MCV 93.7 03/29/2021 1038   MCH 29.6 03/29/2021 1038   MCHC 31.6 03/29/2021 1038   RDW 15.1 03/29/2021 1038   LYMPHSABS 2.2 03/29/2021 1038   MONOABS 0.4 03/29/2021 1038   EOSABS  0.1 03/29/2021 1038   BASOSABS 0.1 03/29/2021 1038    DIAGNOSTIC IMAGING:  I have independently reviewed the scans and discussed with the patient. No results found.   ASSESSMENT:  1.  Iron deficiency anemia: - Patient reports she does have occasional bleeding per rectum. 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 -Last Feraheme on 03/13/2019 on 03/23/2019.   2.  Severe fatigue: -She was evaluated by rheumatology and was diagnosed with polymyalgia rheumatica and Sjogren's syndrome.   PLAN:  1.  Iron deficiency anemia: - We reviewed labs from 03/29/2021.  Ferritin is 145 and percent saturation is 13.  Hemoglobin was 11.8.  She reports worsening headaches in the last 2 months. - She is having menstruation every other month which is not heavy. - Because of worsening ferritin and hemoglobin, recommend Venofer 400 mg x 1. - RTC 6 months for follow-up with repeat CBC, ferritin and iron panel.   2.  Reactive leukocytosis: - Work-up for JAK2 V617F and BCR/ABL was negative on 04/13/2020. - Her connective tissue disorders could be causing reactive leukocytosis.  CBC on 03/29/2021 with white count 12 and absolute neutrophil count elevated at 9.1.   3.  Vitamin D deficiency: - Vitamin D level is 62.  Continue vitamin D weekly.  Orders placed this encounter:  No orders of the defined types were placed in this encounter.    Derek Jack, MD [place] [phone]   I, Thana Ates, am acting as a scribe for Dr. Derek Jack.  I, Derek Jack MD, have reviewed the above documentation for accuracy  and completeness, and I agree with the above.

## 2021-04-06 ENCOUNTER — Inpatient Hospital Stay (HOSPITAL_COMMUNITY): Payer: BC Managed Care – PPO | Admitting: Hematology

## 2021-04-06 ENCOUNTER — Other Ambulatory Visit: Payer: Self-pay

## 2021-04-06 VITALS — BP 145/89 | HR 84 | Temp 98.3°F | Resp 19 | Wt 286.1 lb

## 2021-04-06 DIAGNOSIS — D72829 Elevated white blood cell count, unspecified: Secondary | ICD-10-CM | POA: Diagnosis not present

## 2021-04-06 DIAGNOSIS — D509 Iron deficiency anemia, unspecified: Secondary | ICD-10-CM | POA: Diagnosis not present

## 2021-04-06 NOTE — Patient Instructions (Addendum)
[place] at [facility] Discharge Instructions  You were seen today by Dr. Delton Coombes. He went over your recent results. You will be scheduled to receive 1 infusion of IV Iron (Feraheme). Dr. Delton Coombes will see you back in 6 months for labs and follow up.   Thank you for choosing [place] at [facility] to provide your oncology and hematology care.  To afford each patient quality time with our provider, please arrive at least 15 minutes before your scheduled appointment time.   If you have a lab appointment with the [place] please come in thru the Main Entrance and check in at the main information desk  You need to re-schedule your appointment should you arrive 10 or more minutes late.  We strive to give you quality time with our providers, and arriving late affects you and other patients whose appointments are after yours.  Also, if you no show three or more times for appointments you may be dismissed from the clinic at the providers discretion.     Again, thank you for choosing [facility].  Our hope is that these requests will decrease the amount of time that you wait before being seen by our physicians.       _____________________________________________________________  Should you have questions after your visit to [facility], please contact our office at [phone] between the hours of 8:00 a.m. and 4:30 p.m.  Voicemails left after 4:00 p.m. will not be returned until the following business day.  For prescription refill requests, have your pharmacy contact our office and allow 72 hours.    Cancer Center Support Programs:   > Cancer Support Group  2nd Tuesday of the month 1pm-2pm, Journey Room

## 2021-04-14 ENCOUNTER — Other Ambulatory Visit: Payer: Self-pay

## 2021-04-14 ENCOUNTER — Inpatient Hospital Stay (HOSPITAL_COMMUNITY): Payer: BC Managed Care – PPO | Attending: Hematology

## 2021-04-14 VITALS — BP 123/84 | HR 88 | Temp 97.9°F | Resp 18

## 2021-04-14 DIAGNOSIS — D509 Iron deficiency anemia, unspecified: Secondary | ICD-10-CM | POA: Insufficient documentation

## 2021-04-14 DIAGNOSIS — Z79899 Other long term (current) drug therapy: Secondary | ICD-10-CM | POA: Insufficient documentation

## 2021-04-14 DIAGNOSIS — D500 Iron deficiency anemia secondary to blood loss (chronic): Secondary | ICD-10-CM

## 2021-04-14 MED ORDER — ACETAMINOPHEN 325 MG PO TABS
650.0000 mg | ORAL_TABLET | Freq: Once | ORAL | Status: AC
  Administered 2021-04-14: 650 mg via ORAL
  Filled 2021-04-14: qty 2

## 2021-04-14 MED ORDER — FAMOTIDINE 20 MG PO TABS
20.0000 mg | ORAL_TABLET | Freq: Once | ORAL | Status: AC
  Administered 2021-04-14: 20 mg via ORAL
  Filled 2021-04-14: qty 1

## 2021-04-14 MED ORDER — LORATADINE 10 MG PO TABS
10.0000 mg | ORAL_TABLET | Freq: Once | ORAL | Status: AC
  Administered 2021-04-14: 10 mg via ORAL
  Filled 2021-04-14: qty 1

## 2021-04-14 MED ORDER — SODIUM CHLORIDE 0.9 % IV SOLN: Freq: Once | INTRAVENOUS | Status: AC

## 2021-04-14 MED ORDER — SODIUM CHLORIDE 0.9 % IV SOLN
400.0000 mg | Freq: Once | INTRAVENOUS | Status: AC
  Administered 2021-04-14: 400 mg via INTRAVENOUS
  Filled 2021-04-14: qty 20

## 2021-04-14 NOTE — Progress Notes (Signed)
Pt presents today for Venofer IV iron infusion per provider's order. Vital signs stable and pt voiced no new complaints at this time.  Peripheral IV started with good blood return pre and post infusion.   Venofer given today per MD orders. Tolerated infusion without adverse affects. Vital signs stable. No complaints at this time. Discharged from clinic ambulatory in stable condition. Alert and oriented x 3. F/U with [facility] as scheduled.

## 2021-04-14 NOTE — Patient Instructions (Signed)
[place]  Discharge Instructions: Thank you for choosing [place] to provide your oncology and hematology care.  If you have a lab appointment with the [place], please come in thru the Main Entrance and check in at the main information desk.  Wear comfortable clothing and clothing appropriate for easy access to any Portacath or PICC line.   We strive to give you quality time with your provider. You may need to reschedule your appointment if you arrive late (15 or more minutes).  Arriving late affects you and other patients whose appointments are after yours.  Also, if you miss three or more appointments without notifying the office, you may be dismissed from the clinic at the provider's discretion.      For prescription refill requests, have your pharmacy contact our office and allow 72 hours for refills to be completed.    Today you received Venofer IV iron infusion.   BELOW ARE SYMPTOMS THAT SHOULD BE REPORTED IMMEDIATELY: *FEVER GREATER THAN 100.4 F (38 C) OR HIGHER *CHILLS OR SWEATING *NAUSEA AND VOMITING THAT IS NOT CONTROLLED WITH YOUR NAUSEA MEDICATION *UNUSUAL SHORTNESS OF BREATH *UNUSUAL BRUISING OR BLEEDING *URINARY PROBLEMS (pain or burning when urinating, or frequent urination) *BOWEL PROBLEMS (unusual diarrhea, constipation, pain near the anus) TENDERNESS IN MOUTH AND THROAT WITH OR WITHOUT PRESENCE OF ULCERS (sore throat, sores in mouth, or a toothache) UNUSUAL RASH, SWELLING OR PAIN  UNUSUAL VAGINAL DISCHARGE OR ITCHING   Items with * indicate a potential emergency and should be followed up as soon as possible or go to the Emergency Department if any problems should occur.  Please show the CHEMOTHERAPY ALERT CARD or IMMUNOTHERAPY ALERT CARD at check-in to the Emergency Department and triage nurse.  Should you have questions after your visit or need to cancel or reschedule your appointment, please contact [facility]  [phone]  and follow the prompts.  Office hours are 8:00 a.m. to 4:30 p.m. Monday - Friday. Please note that voicemails left after 4:00 p.m. may not be returned until the following business day.  We are closed weekends and major holidays. You have access to a nurse at all times for urgent questions. Please call the main number to the clinic [phone] and follow the prompts.  For any non-urgent questions, you may also contact your provider using MyChart. We now offer e-Visits for anyone 6 and older to request care online for non-urgent symptoms. For details visit [email or web address].   Also download the MyChart app! Go to the app store, search "MyChart", open the app, select [place], and log in with your MyChart username and password.  Due to Covid, a mask is required upon entering the hospital/clinic. If you do not have a mask, one will be given to you upon arrival. For doctor visits, patients may have 1 support person aged 67 or older with them. For treatment visits, patients cannot have anyone with them due to current Covid guidelines and our immunocompromised population.

## 2021-04-17 ENCOUNTER — Telehealth (HOSPITAL_COMMUNITY): Payer: Self-pay | Admitting: *Deleted

## 2021-04-17 NOTE — Telephone Encounter (Signed)
Patient Maureen Yates to advise that she has developed edema in hands, ankles and feet since receiving Venofer on Friday.  States hands are tight and she is having difficulty bending them.  Have attempted to contact her prior sending message for more details, however unable to reach her.

## 2021-04-17 NOTE — Telephone Encounter (Signed)
Patient made aware and verbalized understanding.

## 2021-04-25 ENCOUNTER — Telehealth: Payer: Self-pay

## 2021-04-25 NOTE — Telephone Encounter (Signed)
Received medical record request from [facility]. Sent to [facility] and to scan

## 2021-10-02 ENCOUNTER — Inpatient Hospital Stay (HOSPITAL_COMMUNITY): Payer: BC Managed Care – PPO | Attending: Hematology

## 2021-10-02 DIAGNOSIS — E039 Hypothyroidism, unspecified: Secondary | ICD-10-CM | POA: Diagnosis not present

## 2021-10-02 DIAGNOSIS — E559 Vitamin D deficiency, unspecified: Secondary | ICD-10-CM | POA: Insufficient documentation

## 2021-10-02 DIAGNOSIS — D509 Iron deficiency anemia, unspecified: Secondary | ICD-10-CM | POA: Diagnosis not present

## 2021-10-02 DIAGNOSIS — D72829 Elevated white blood cell count, unspecified: Secondary | ICD-10-CM | POA: Diagnosis not present

## 2021-10-02 LAB — CBC WITH DIFFERENTIAL/PLATELET
Abs Immature Granulocytes: 0.05 10*3/uL (ref 0.00–0.07)
Basophils Absolute: 0.1 10*3/uL (ref 0.0–0.1)
Basophils Relative: 1 %
Eosinophils Absolute: 0.4 10*3/uL (ref 0.0–0.5)
Eosinophils Relative: 3 %
HCT: 40.4 % (ref 36.0–46.0)
Hemoglobin: 12.6 g/dL (ref 12.0–15.0)
Immature Granulocytes: 0 %
Lymphocytes Relative: 18 %
Lymphs Abs: 2.3 10*3/uL (ref 0.7–4.0)
MCH: 28.5 pg (ref 26.0–34.0)
MCHC: 31.2 g/dL (ref 30.0–36.0)
MCV: 91.4 fL (ref 80.0–100.0)
Monocytes Absolute: 0.5 10*3/uL (ref 0.1–1.0)
Monocytes Relative: 4 %
Neutro Abs: 9.7 10*3/uL — ABNORMAL HIGH (ref 1.7–7.7)
Neutrophils Relative %: 74 %
Platelets: 418 10*3/uL — ABNORMAL HIGH (ref 150–400)
RBC: 4.42 MIL/uL (ref 3.87–5.11)
RDW: 15 % (ref 11.5–15.5)
WBC: 13 10*3/uL — ABNORMAL HIGH (ref 4.0–10.5)
nRBC: 0 % (ref 0.0–0.2)

## 2021-10-02 LAB — IRON AND TIBC
Iron: 51 ug/dL (ref 28–170)
Saturation Ratios: 14 % (ref 10.4–31.8)
TIBC: 361 ug/dL (ref 250–450)
UIBC: 310 ug/dL

## 2021-10-02 LAB — COMPREHENSIVE METABOLIC PANEL
ALT: 27 U/L (ref 0–44)
AST: 24 U/L (ref 15–41)
Albumin: 4.2 g/dL (ref 3.5–5.0)
Alkaline Phosphatase: 68 U/L (ref 38–126)
Anion gap: 10 (ref 5–15)
BUN: 10 mg/dL (ref 6–20)
CO2: 25 mmol/L (ref 22–32)
Calcium: 9.1 mg/dL (ref 8.9–10.3)
Chloride: 104 mmol/L (ref 98–111)
Creatinine, Ser: 0.66 mg/dL (ref 0.44–1.00)
GFR, Estimated: >60 mL/min (ref >60)
Glucose, Bld: 80 mg/dL (ref 70–99)
Potassium: 3.3 mmol/L — ABNORMAL LOW (ref 3.5–5.1)
Sodium: 139 mmol/L (ref 135–145)
Total Bilirubin: 0.4 mg/dL (ref 0.3–1.2)
Total Protein: 7.6 g/dL (ref 6.5–8.1)

## 2021-10-02 LAB — FERRITIN: Ferritin: 232 ng/mL (ref 11–307)

## 2021-10-02 LAB — SEDIMENTATION RATE: Sed Rate: 35 mm/hr — ABNORMAL HIGH (ref 0–22)

## 2021-10-02 LAB — VITAMIN D 25 HYDROXY (VIT D DEFICIENCY, FRACTURES): Vit D, 25-Hydroxy: 48.71 ng/mL (ref 30–100)

## 2021-10-02 LAB — C-REACTIVE PROTEIN: CRP: 1.2 mg/dL — ABNORMAL HIGH (ref <1.0)

## 2021-10-04 ENCOUNTER — Inpatient Hospital Stay (HOSPITAL_COMMUNITY): Payer: BC Managed Care – PPO

## 2021-10-11 ENCOUNTER — Inpatient Hospital Stay (HOSPITAL_COMMUNITY): Payer: BC Managed Care – PPO | Attending: Hematology | Admitting: Hematology

## 2021-10-11 ENCOUNTER — Encounter (HOSPITAL_COMMUNITY): Payer: Self-pay | Admitting: Hematology

## 2021-10-11 VITALS — BP 145/96 | HR 97 | Temp 97.9°F | Resp 18 | Ht 65.95 in | Wt 267.4 lb

## 2021-10-11 DIAGNOSIS — D72829 Elevated white blood cell count, unspecified: Secondary | ICD-10-CM | POA: Insufficient documentation

## 2021-10-11 DIAGNOSIS — M353 Polymyalgia rheumatica: Secondary | ICD-10-CM | POA: Insufficient documentation

## 2021-10-11 DIAGNOSIS — K219 Gastro-esophageal reflux disease without esophagitis: Secondary | ICD-10-CM | POA: Insufficient documentation

## 2021-10-11 DIAGNOSIS — Z79899 Other long term (current) drug therapy: Secondary | ICD-10-CM | POA: Insufficient documentation

## 2021-10-11 DIAGNOSIS — E559 Vitamin D deficiency, unspecified: Secondary | ICD-10-CM | POA: Diagnosis not present

## 2021-10-11 DIAGNOSIS — M3500 Sicca syndrome, unspecified: Secondary | ICD-10-CM | POA: Insufficient documentation

## 2021-10-11 DIAGNOSIS — D509 Iron deficiency anemia, unspecified: Secondary | ICD-10-CM | POA: Insufficient documentation

## 2021-10-11 DIAGNOSIS — D500 Iron deficiency anemia secondary to blood loss (chronic): Secondary | ICD-10-CM

## 2021-10-11 DIAGNOSIS — K589 Irritable bowel syndrome without diarrhea: Secondary | ICD-10-CM | POA: Insufficient documentation

## 2021-10-11 DIAGNOSIS — E063 Autoimmune thyroiditis: Secondary | ICD-10-CM | POA: Insufficient documentation

## 2021-10-11 NOTE — Progress Notes (Signed)
? ?[facility] ?[address] ?[place], [place] [address] ? ? ?CLINIC:  ?Medical Oncology/Hematology ? ?PCP:  ?Maureen Labrum, MD ?[address] [place] [address]  ?[phone] ? ?REASON FOR VISIT:  ?Follow-up for IDA ? ?PRIOR THERAPY: none ? ?CURRENT THERAPY: Intermittent Venofer last on 04/14/2021 ? ?INTERVAL HISTORY:  ?[name], a 41 y.o. female, returns for routine follow-up for her IDA. Maureen Yates was last seen on 04/06/2021. ? ?Today she reports feeling well. Her energy is low. She is intentionally losing weight, and she has lost 19 lbs since her last visit. She repots light menses, and she denies hematuria and hematochezia.  ? ?REVIEW OF SYSTEMS:  ?Review of Systems  ?Constitutional:  Positive for fatigue. Negative for unexpected weight change.  ?Gastrointestinal:  Negative for blood in stool.  ?Genitourinary:  Negative for hematuria and menstrual problem.   ?All other systems reviewed and are negative. ? ?PAST MEDICAL/SURGICAL HISTORY:  ?Past Medical History:  ?Diagnosis Date  ? Anxiety   ? Environmental allergies   ? GERD (gastroesophageal reflux disease)   ? Hashimoto's thyroiditis   ? IBS (irritable bowel syndrome)   ? Myofascial pain   ? Tremor   ? right arm  ? Tubal ectopic pregnancy   ? ?Past Surgical History:  ?Procedure Laterality Date  ? CHOLECYSTECTOMY  Sept of 2012 gallstones  ? COLONOSCOPY  02/27/2012  ? Procedure: COLONOSCOPY;  Surgeon: Rogene Houston, MD;  [facility];  Service: Endoscopy;  Laterality: N/A;  225  ? eptopic pregnancy  2015  ? rt ovary     ? removed 03/2010 for 4 pound tumor  ? TUBAL LIGATION Bilateral 04/2013  ? ? ?SOCIAL HISTORY:  ?Social History  ? ?Socioeconomic History  ? Marital status: Married  ?  Spouse name: Maureen Yates  ? Number of children: 2  ? Years of education: Not on file  ? Highest education level: Not on file  ?Occupational History  ? Not on file  ?Tobacco Use  ? Smoking status: Never  ? Smokeless tobacco: Never  ?Vaping Use  ? Vaping  Use: Never used  ?Substance and Sexual Activity  ? Alcohol use: No  ? Drug use: No  ? Sexual activity: Yes  ?Other Topics Concern  ? Not on file  ?Social History Narrative  ? Lives at home with husband and children  ? Right handed  ? Caffeine: avg of 16 oz soda daily  ? ?Social Determinants of Health  ? ?Financial Resource Strain: Not on file  ?Food Insecurity: Not on file  ?Transportation Needs: Not on file  ?Physical Activity: Not on file  ?Stress: Not on file  ?Social Connections: Not on file  ?Intimate Partner Violence: Not on file  ? ? ?FAMILY HISTORY:  ?Family History  ?Problem Relation Age of Onset  ? Epilepsy Mother   ? Fibromyalgia Mother   ? Epilepsy Brother   ? Lupus Brother   ? Crohn's disease Father   ? COPD Father   ? Graves' disease Maternal Grandmother   ? Healthy Son   ? Allergy (severe) Son   ? Healthy Daughter   ? Parkinson's disease Maternal Great-grandfather   ? ? ?CURRENT MEDICATIONS:  ?Current Outpatient Medications  ?Medication Sig Dispense Refill  ? albuterol (VENTOLIN HFA) 108 (90 Base) MCG/ACT inhaler Inhale 2 puffs into the lungs every 6 (six) hours as needed. (Patient not taking: Reported on 04/14/2021)    ? ALPRAZolam (XANAX) 0.5 MG tablet Take 0.25-0.5 mg  by mouth as needed (start of tremor).    ? fluconazole (DIFLUCAN) 150 MG tablet Take 150 mg by mouth every 30 (thirty) days.  (Patient not taking: Reported on 04/14/2021)    ? FLUoxetine (PROZAC) 20 MG capsule Take 20 mg by mouth daily.    ? Gabapentin, Once-Daily, 300 MG TABS     ? hydroxychloroquine (PLAQUENIL) 200 MG tablet Take 200 mg by mouth daily.    ? ibuprofen (ADVIL) 800 MG tablet Take 800 mg by mouth 3 (three) times daily.    ? ketoconazole (NIZORAL) 2 % shampoo USE AS BODY WASH AS DIRECTED    ? lamoTRIgine (LAMICTAL) 100 MG tablet Take 100 mg by mouth daily.    ? levothyroxine (SYNTHROID) 88 MCG tablet TAKE 1 TABLET BY MOUTH ONCE DAILY BEFORE BREAKFAST 90 tablet 1  ? mupirocin ointment (BACTROBAN) 2 % APPLY TO AFFECTED AREA  TWICE A DAY    ? SUMAtriptan (IMITREX) 50 MG tablet SMARTSIG:1 Tablet(s) By Mouth 1-2 Times Daily    ? Vitamin D, Ergocalciferol, (DRISDOL) 1.25 MG (50000 UNIT) CAPS capsule TAKE 1 CAPSULE BY MOUTH ONE TIME PER WEEK 30 capsule 1  ? ?No current facility-administered medications for this visit.  ? ? ?ALLERGIES:  ?Allergies  ?Allergen Reactions  ? Penicillins Rash  ? Casein Other (See Comments)  ?  G.I. Upset  ? Augmentin [Amoxicillin-Pot Clavulanate] Swelling and Rash  ? ? ?PHYSICAL EXAM:  ?Performance status (ECOG): 1 - Symptomatic but completely ambulatory ? ?Vitals:  ? 10/11/21 1156  ?BP: (!) 145/96  ?Pulse: 97  ?Resp: 18  ?Temp: 97.9 ?F (36.6 ?C)  ?SpO2: 98%  ? ?Wt Readings from Last 3 Encounters:  ?10/11/21 267 lb 6.7 oz (121.3 kg)  ?04/06/21 286 lb 1.6 oz (129.8 kg)  ?09/27/20 274 lb 14.6 oz (124.7 kg)  ? ?Physical Exam ?Vitals reviewed.  ?Constitutional:   ?   Appearance: Normal appearance. She is obese.  ?Cardiovascular:  ?   Rate and Rhythm: Normal rate and regular rhythm.  ?   Pulses: Normal pulses.  ?   Heart sounds: Normal heart sounds.  ?Pulmonary:  ?   Effort: Pulmonary effort is normal.  ?   Breath sounds: Normal breath sounds.  ?Neurological:  ?   General: No focal deficit present.  ?   Mental Status: She is alert and oriented to person, place, and time.  ?Psychiatric:     ?   Mood and Affect: Mood normal.     ?   Behavior: Behavior normal.  ? ? ?LABORATORY DATA:  ?I have reviewed the labs as listed.  ? ?  Latest Ref Rng & Units 10/02/2021  ?  1:40 PM 03/29/2021  ? 10:38 AM 09/22/2020  ? 12:59 PM  ?CBC  ?WBC 4.0 - 10.5 K/uL 13.0   12.0   14.8    ?Hemoglobin 12.0 - 15.0 g/dL 12.6   11.8   13.2    ?Hematocrit 36.0 - 46.0 % 40.4   37.3   41.1    ?Platelets 150 - 400 K/uL 418   362   429    ? ? ?  Latest Ref Rng & Units 10/02/2021  ?  1:40 PM 09/22/2020  ? 12:59 PM 04/13/2020  ? 10:59 AM  ?CMP  ?Glucose 70 - 99 mg/dL 80   122   119    ?BUN 6 - 20 mg/dL 10   10   13     ?Creatinine 0.44 - 1.00 mg/dL 0.66   0.58  0.73    ?Sodium 135 - 145 mmol/L 139   140   138    ?Potassium 3.5 - 5.1 mmol/L 3.3   3.3   3.6    ?Chloride 98 - 111 mmol/L 104   103   100    ?CO2 22 - 32 mmol/L 25   27   27     ?Calcium 8.9 - 10.3 mg/dL 9.1   9.2   9.7    ?Total Protein 6.5 - 8.1 g/dL 7.6   7.2   7.6    ?Total Bilirubin 0.3 - 1.2 mg/dL 0.4   0.3   0.8    ?Alkaline Phos 38 - 126 U/L 68   77   67    ?AST 15 - 41 U/L 24   22   18     ?ALT 0 - 44 U/L 27   54   32    ? ?   ?Component Value Date/Time  ? RBC 4.42 10/02/2021 1340  ? MCV 91.4 10/02/2021 1340  ? MCH 28.5 10/02/2021 1340  ? MCHC 31.2 10/02/2021 1340  ? RDW 15.0 10/02/2021 1340  ? LYMPHSABS 2.3 10/02/2021 1340  ? MONOABS 0.5 10/02/2021 1340  ? EOSABS 0.4 10/02/2021 1340  ? BASOSABS 0.1 10/02/2021 1340  ? ? ?DIAGNOSTIC IMAGING:  ?I have independently reviewed the scans and discussed with the patient. ?No results found.  ? ?ASSESSMENT:  ?1.  Iron deficiency anemia: ?- Patient reports she does have occasional bleeding per rectum. 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 ?-Last Feraheme on 03/13/2019 on 03/23/2019. ?  ?2.  Severe fatigue: ?-She was evaluated by rheumatology and was diagnosed with polymyalgia rheumatica and Sjogren's syndrome. ? ? ?PLAN:  ?1.  Iron deficiency anemia: ?- She has menses every other month with moderate bleeding. ?- Last Venofer 400 mg on 04/14/2021. ?- Reviewed labs from 10/02/2021 which showed hemoglobin 12.6.  Ferritin is 232 and percent saturation 14.  Creatinine is normal. ?- No indication for parenteral iron therapy.  RTC 6 months for follow-up. ?  ?2.  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 ?- CRP is elevated at 1.2.  White count is 13 and stable.  Also has reactive thrombocytosis with platelet count 418. ?  ?3.  Vitamin D  deficiency: ?- Vitamin D is 48.  Continue weekly vitamin D. ? ?Orders placed this encounter:  ?No orders of the defined types were placed in this encounter. ? ? ? ?Derek Jack, MD ?[place]

## 2021-10-11 NOTE — Patient Instructions (Signed)
[place] at [facility] ?Discharge Instructions ? ? ?You were seen and examined today by Dr. Delton Coombes. ? ?He reviewed your lab work which is normal/stable ? ?Return as scheduled in 6 months.  ? ? ?Thank you for choosing [place] at [facility] to provide your oncology and hematology care.  To afford each patient quality time with our provider, please arrive at least 15 minutes before your scheduled appointment time.  ? ?If you have a lab appointment with the [place] please come in thru the Main Entrance and check in at the main information desk. ? ?You need to re-schedule your appointment should you arrive 10 or more minutes late.  We strive to give you quality time with our providers, and arriving late affects you and other patients whose appointments are after yours.  Also, if you no show three or more times for appointments you may be dismissed from the clinic at the providers discretion.     ?Again, thank you for choosing [facility].  Our hope is that these requests will decrease the amount of time that you wait before being seen by our physicians.       ?_____________________________________________________________ ? ?Should you have questions after your visit to [facility], please contact our office at [phone] and follow the prompts.  Our office hours are 8:00 a.m. and 4:30 p.m. Monday - Friday.  Please note that voicemails left after 4:00 p.m. may not be returned until the following business day.  We are closed weekends and major holidays.  You do have access to a nurse 24-7, just call the main number to the clinic [phone] and do not press any options, hold on the line and a nurse will answer the phone.   ? ?For prescription refill requests, have your pharmacy contact our office and allow 72 hours.   ? ?Due to Covid, you will need to wear a mask upon entering the hospital. If you do not have a mask, a mask will  be given to you at the Main Entrance upon arrival. For doctor visits, patients may have 1 support person age 2 or older with them. For treatment visits, patients can not have anyone with them due to social distancing guidelines and our immunocompromised population.  ? ?   ?

## 2022-02-02 DIAGNOSIS — Z92241 Personal history of systemic steroid therapy: Secondary | ICD-10-CM | POA: Diagnosis not present

## 2022-02-02 DIAGNOSIS — E063 Autoimmune thyroiditis: Secondary | ICD-10-CM | POA: Diagnosis not present

## 2022-02-02 DIAGNOSIS — E242 Drug-induced Cushing's syndrome: Secondary | ICD-10-CM | POA: Diagnosis not present

## 2022-02-02 DIAGNOSIS — E039 Hypothyroidism, unspecified: Secondary | ICD-10-CM | POA: Diagnosis not present

## 2022-02-10 ENCOUNTER — Other Ambulatory Visit (HOSPITAL_COMMUNITY): Payer: Self-pay | Admitting: Hematology

## 2022-02-10 DIAGNOSIS — E559 Vitamin D deficiency, unspecified: Secondary | ICD-10-CM

## 2022-02-12 ENCOUNTER — Encounter (HOSPITAL_COMMUNITY): Payer: Self-pay | Admitting: Hematology

## 2022-04-17 ENCOUNTER — Inpatient Hospital Stay: Payer: BC Managed Care – PPO | Attending: Hematology

## 2022-04-17 DIAGNOSIS — D509 Iron deficiency anemia, unspecified: Secondary | ICD-10-CM | POA: Diagnosis not present

## 2022-04-17 DIAGNOSIS — D72829 Elevated white blood cell count, unspecified: Secondary | ICD-10-CM | POA: Insufficient documentation

## 2022-04-17 DIAGNOSIS — E559 Vitamin D deficiency, unspecified: Secondary | ICD-10-CM | POA: Diagnosis not present

## 2022-04-17 DIAGNOSIS — D500 Iron deficiency anemia secondary to blood loss (chronic): Secondary | ICD-10-CM

## 2022-04-17 LAB — CBC WITH DIFFERENTIAL/PLATELET
Abs Immature Granulocytes: 0.05 10*3/uL (ref 0.00–0.07)
Basophils Absolute: 0.1 10*3/uL (ref 0.0–0.1)
Basophils Relative: 1 %
Eosinophils Absolute: 0.3 10*3/uL (ref 0.0–0.5)
Eosinophils Relative: 3 %
HCT: 39 % (ref 36.0–46.0)
Hemoglobin: 12.6 g/dL (ref 12.0–15.0)
Immature Granulocytes: 0 %
Lymphocytes Relative: 16 %
Lymphs Abs: 1.9 10*3/uL (ref 0.7–4.0)
MCH: 29.3 pg (ref 26.0–34.0)
MCHC: 32.3 g/dL (ref 30.0–36.0)
MCV: 90.7 fL (ref 80.0–100.0)
Monocytes Absolute: 0.4 10*3/uL (ref 0.1–1.0)
Monocytes Relative: 4 %
Neutro Abs: 8.7 10*3/uL — ABNORMAL HIGH (ref 1.7–7.7)
Neutrophils Relative %: 76 %
Platelets: 364 10*3/uL (ref 150–400)
RBC: 4.3 MIL/uL (ref 3.87–5.11)
RDW: 14.4 % (ref 11.5–15.5)
WBC: 11.4 10*3/uL — ABNORMAL HIGH (ref 4.0–10.5)
nRBC: 0 % (ref 0.0–0.2)

## 2022-04-17 LAB — IRON AND TIBC
Iron: 49 ug/dL (ref 28–170)
Saturation Ratios: 14 % (ref 10.4–31.8)
TIBC: 347 ug/dL (ref 250–450)
UIBC: 298 ug/dL

## 2022-04-17 LAB — C-REACTIVE PROTEIN: CRP: 1.1 mg/dL — ABNORMAL HIGH (ref <1.0)

## 2022-04-17 LAB — VITAMIN D 25 HYDROXY (VIT D DEFICIENCY, FRACTURES): Vit D, 25-Hydroxy: 52.15 ng/mL (ref 30–100)

## 2022-04-17 LAB — FERRITIN: Ferritin: 154 ng/mL (ref 11–307)

## 2022-04-24 ENCOUNTER — Inpatient Hospital Stay: Payer: BC Managed Care – PPO | Admitting: Hematology

## 2022-04-24 VITALS — BP 136/87 | HR 84 | Temp 97.9°F | Resp 18 | Ht 67.0 in | Wt 275.2 lb

## 2022-04-24 DIAGNOSIS — D509 Iron deficiency anemia, unspecified: Secondary | ICD-10-CM | POA: Diagnosis not present

## 2022-04-24 DIAGNOSIS — D500 Iron deficiency anemia secondary to blood loss (chronic): Secondary | ICD-10-CM

## 2022-04-24 DIAGNOSIS — D72829 Elevated white blood cell count, unspecified: Secondary | ICD-10-CM | POA: Diagnosis not present

## 2022-04-24 DIAGNOSIS — E559 Vitamin D deficiency, unspecified: Secondary | ICD-10-CM | POA: Diagnosis not present

## 2022-04-24 NOTE — Patient Instructions (Signed)
[place] at [facility] Discharge Instructions   You were seen and examined today by Dr. Delton Coombes.  He reviewed the results of your lab work which are normal/stable. Your ferritin is normal but has dropped since the last time we saw you.  We will plan to give you 2 doses of Venofer (iron). These will be given 2 weeks apart.  We will see you back in 6 months. We will repeat blood work prior to your next office visit.     Thank you for choosing [place] at [facility] to provide your oncology and hematology care.  To afford each patient quality time with our provider, please arrive at least 15 minutes before your scheduled appointment time.   If you have a lab appointment with the [place] please come in thru the Main Entrance and check in at the main information desk.  You need to re-schedule your appointment should you arrive 10 or more minutes late.  We strive to give you quality time with our providers, and arriving late affects you and other patients whose appointments are after yours.  Also, if you no show three or more times for appointments you may be dismissed from the clinic at the providers discretion.     Again, thank you for choosing [facility].  Our hope is that these requests will decrease the amount of time that you wait before being seen by our physicians.       _____________________________________________________________  Should you have questions after your visit to [facility], please contact our office at [phone] and follow the prompts.  Our office hours are 8:00 a.m. and 4:30 p.m. Monday - Friday.  Please note that voicemails left after 4:00 p.m. may not be returned until the following business day.  We are closed weekends and major holidays.  You do have access to a nurse 24-7, just call the main number to the clinic [phone] and do not press any options, hold on the line and a  nurse will answer the phone.    For prescription refill requests, have your pharmacy contact our office and allow 72 hours.    Due to Covid, you will need to wear a mask upon entering the hospital. If you do not have a mask, a mask will be given to you at the Main Entrance upon arrival. For doctor visits, patients may have 1 support person age 59 or older with them. For treatment visits, patients can not have anyone with them due to social distancing guidelines and our immunocompromised population.

## 2022-04-24 NOTE — Progress Notes (Signed)
[place], [place] [address]   CLINIC:  Medical Oncology/Hematology  PCP:  [name], MD [address] [place] [address]  [phone]  REASON FOR VISIT:  Follow-up for IDA  PRIOR THERAPY: none  CURRENT THERAPY: Intermittent Venofer last on 04/14/2021  INTERVAL HISTORY:  [name], a 41 y.o. female, seen for follow-up of iron deficiency anemia.  Denies any fevers, night sweats or weight loss in the last 6 months.  Menses have been stable.  Reports severe fatigue.  Energy levels are reported at 20%.  Also reports tongue feeling sore and palpitations which are typically present when her iron levels get low.  REVIEW OF SYSTEMS:  Review of Systems  Constitutional:  Positive for fatigue.  Cardiovascular:  Positive for palpitations.  All other systems reviewed and are negative.   PAST MEDICAL/SURGICAL HISTORY:  Past Medical History:  Diagnosis Date   Anxiety    Environmental allergies    GERD (gastroesophageal reflux disease)    Hashimoto's thyroiditis    IBS (irritable bowel syndrome)    Myofascial pain    Tremor    right arm   Tubal ectopic pregnancy    Past Surgical History:  Procedure Laterality Date   CHOLECYSTECTOMY  Sept of 2012 gallstones   COLONOSCOPY  02/27/2012   Procedure: COLONOSCOPY;  Surgeon: Rogene Houston, MD;  Location: [facility];  Service: Endoscopy;  Laterality: N/A;  225   eptopic pregnancy  2015   rt ovary      removed 03/2010 for 4 pound tumor   TUBAL LIGATION Bilateral 04/2013    SOCIAL HISTORY:  Social History   Socioeconomic History   Marital status: Married    Spouse name: Corene Cornea   Number of children: 2   Years of education: Not on file   Highest education level: Not on file  Occupational History   Not on file  Tobacco Use   Smoking status: Never   Smokeless tobacco: Never  Vaping Use   Vaping Use: Never used  Substance and Sexual Activity   Alcohol use: No   Drug  use: No   Sexual activity: Yes  Other Topics Concern   Not on file  Social History Narrative   Lives at home with husband and children   Right handed   Caffeine: avg of 16 oz soda daily   Social Determinants of Health   Financial Resource Strain: Not on file  Food Insecurity: Not on file  Transportation Needs: Not on file  Physical Activity: Not on file  Stress: Not on file  Social Connections: Not on file  Intimate Partner Violence: Not on file    FAMILY HISTORY:  Family History  Problem Relation Age of Onset   Epilepsy Mother    Fibromyalgia Mother    Epilepsy Brother    Lupus Brother    Crohn's disease Father    COPD Father    Graves' disease Maternal Grandmother    Healthy Son    Allergy (severe) Son    Healthy Daughter    Parkinson's disease Maternal Great-grandfather     CURRENT MEDICATIONS:  Current Outpatient Medications  Medication Sig Dispense Refill   albuterol (VENTOLIN HFA) 108 (90 Base) MCG/ACT inhaler Inhale 2 puffs into the lungs every 6 (six) hours as needed. (Patient not taking: Reported on 04/14/2021)     ALPRAZolam (XANAX) 0.5 MG tablet Take 0.25-0.5 mg by mouth as needed (start of tremor).  fluconazole (DIFLUCAN) 150 MG tablet Take 150 mg by mouth every 30 (thirty) days.  (Patient not taking: Reported on 04/14/2021)     FLUoxetine (PROZAC) 20 MG capsule Take 20 mg by mouth daily.     ibuprofen (ADVIL) 800 MG tablet Take 800 mg by mouth 3 (three) times daily.     ketoconazole (NIZORAL) 2 % shampoo USE AS BODY WASH AS DIRECTED     lamoTRIgine (LAMICTAL) 100 MG tablet Take 100 mg by mouth daily.     levothyroxine (SYNTHROID) 88 MCG tablet TAKE 1 TABLET BY MOUTH ONCE DAILY BEFORE BREAKFAST 90 tablet 1   mupirocin ointment (BACTROBAN) 2 % APPLY TO AFFECTED AREA TWICE A DAY     SAXENDA 18 MG/3ML SOPN Inject into the skin.     SUMAtriptan (IMITREX) 50 MG tablet SMARTSIG:1 Tablet(s) By Mouth 1-2 Times Daily     Vitamin D, Ergocalciferol, (DRISDOL) 1.25  MG (50000 UNIT) CAPS capsule TAKE 1 CAPSULE BY MOUTH ONE TIME PER WEEK 12 capsule 4   No current facility-administered medications for this visit.    ALLERGIES:  Allergies  Allergen Reactions   Penicillins Rash   Casein Other (See Comments)    G.I. Upset   Augmentin [Amoxicillin-Pot Clavulanate] Swelling and Rash    PHYSICAL EXAM:  Performance status (ECOG): 1 - Symptomatic but completely ambulatory  There were no vitals filed for this visit.  Wt Readings from Last 3 Encounters:  10/11/21 267 lb 6.7 oz (121.3 kg)  04/06/21 286 lb 1.6 oz (129.8 kg)  09/27/20 274 lb 14.6 oz (124.7 kg)   Physical Exam Vitals reviewed.  Constitutional:      Appearance: Normal appearance. She is obese.  Cardiovascular:     Rate and Rhythm: Normal rate and regular rhythm.     Pulses: Normal pulses.     Heart sounds: Normal heart sounds.  Pulmonary:     Effort: Pulmonary effort is normal.     Breath sounds: Normal breath sounds.  Neurological:     General: No focal deficit present.     Mental Status: She is alert and oriented to person, place, and time.  Psychiatric:        Mood and Affect: Mood normal.        Behavior: Behavior normal.    LABORATORY DATA:  I have reviewed the labs as listed.     Latest Ref Rng & Units 04/17/2022   10:37 AM 10/02/2021    1:40 PM 03/29/2021   10:38 AM  CBC  WBC 4.0 - 10.5 K/uL 11.4  13.0  12.0   Hemoglobin 12.0 - 15.0 g/dL 12.6  12.6  11.8   Hematocrit 36.0 - 46.0 % 39.0  40.4  37.3   Platelets 150 - 400 K/uL 364  418  362       Latest Ref Rng & Units 10/02/2021    1:40 PM 09/22/2020   12:59 PM 04/13/2020   10:59 AM  CMP  Glucose 70 - 99 mg/dL 80  122  119   BUN 6 - 20 mg/dL 10  10  13    Creatinine 0.44 - 1.00 mg/dL 0.66  0.58  0.73   Sodium 135 - 145 mmol/L 139  140  138   Potassium 3.5 - 5.1 mmol/L 3.3  3.3  3.6   Chloride 98 - 111 mmol/L 104  103  100   CO2 22 - 32 mmol/L 25  27  27    Calcium 8.9 - 10.3 mg/dL 9.1  9.2  9.7   Total Protein  6.5 - 8.1 g/dL 7.6  7.2  7.6   Total Bilirubin 0.3 - 1.2 mg/dL 0.4  0.3  0.8   Alkaline Phos 38 - 126 U/L 68  77  67   AST 15 - 41 U/L 24  22  18    ALT 0 - 44 U/L 27  54  32       Component Value Date/Time   RBC 4.30 04/17/2022 1037   MCV 90.7 04/17/2022 1037   MCH 29.3 04/17/2022 1037   MCHC 32.3 04/17/2022 1037   RDW 14.4 04/17/2022 1037   LYMPHSABS 1.9 04/17/2022 1037   MONOABS 0.4 04/17/2022 1037   EOSABS 0.3 04/17/2022 1037   BASOSABS 0.1 04/17/2022 1037    DIAGNOSTIC IMAGING:  I have independently reviewed the scans and discussed with the patient. No results found.   ASSESSMENT:  1.  Iron deficiency anemia: - Patient reports she does have occasional bleeding per rectum. 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 -Last Feraheme on 03/13/2019 on 03/23/2019.   2.  Severe fatigue: -She was evaluated by rheumatology and was diagnosed with polymyalgia rheumatica and Sjogren's syndrome.   PLAN:  1.  Iron deficiency anemia: - She has menses every other month with moderate bleeding.  This is stable. - Last Venofer was on 04/14/2021, 400 mg. - Reviewed labs from 04/17/2022 which showed ferritin is down to 154 from 232.  Hemoglobin is normal at 12.6.  However she has severe fatigue which is limiting her day-to-day activities.  I have recommended Venofer 400 mg IV x2. - RTC 6 months for follow-up with repeat labs including ferritin and iron panel.   2.  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 - CRP is chronically elevated at 1.1.  White count is better today at 11.4 with differential showing predominantly neutrophils.  No further work-up is needed.   3.  Vitamin D deficiency: - Vitamin D is normal at 52.  Continue weekly vitamin D.  Orders  placed this encounter:  No orders of the defined types were placed in this encounter.    Derek Jack, MD [place] [phone]

## 2022-04-30 ENCOUNTER — Inpatient Hospital Stay: Payer: BC Managed Care – PPO

## 2022-04-30 VITALS — BP 114/69 | HR 95 | Temp 98.7°F | Resp 18

## 2022-04-30 DIAGNOSIS — D500 Iron deficiency anemia secondary to blood loss (chronic): Secondary | ICD-10-CM

## 2022-04-30 DIAGNOSIS — D509 Iron deficiency anemia, unspecified: Secondary | ICD-10-CM | POA: Diagnosis not present

## 2022-04-30 DIAGNOSIS — E559 Vitamin D deficiency, unspecified: Secondary | ICD-10-CM | POA: Diagnosis not present

## 2022-04-30 DIAGNOSIS — D72829 Elevated white blood cell count, unspecified: Secondary | ICD-10-CM | POA: Diagnosis not present

## 2022-04-30 MED ORDER — ACETAMINOPHEN 325 MG PO TABS
650.0000 mg | ORAL_TABLET | Freq: Once | ORAL | Status: AC
  Administered 2022-04-30: 650 mg via ORAL
  Filled 2022-04-30: qty 2

## 2022-04-30 MED ORDER — LORATADINE 10 MG PO TABS
10.0000 mg | ORAL_TABLET | Freq: Once | ORAL | Status: AC
  Administered 2022-04-30: 10 mg via ORAL
  Filled 2022-04-30: qty 1

## 2022-04-30 MED ORDER — SODIUM CHLORIDE 0.9 % IV SOLN: Freq: Once | INTRAVENOUS | Status: AC

## 2022-04-30 MED ORDER — FAMOTIDINE 20 MG PO TABS
20.0000 mg | ORAL_TABLET | Freq: Once | ORAL | Status: AC
  Administered 2022-04-30: 20 mg via ORAL
  Filled 2022-04-30: qty 1

## 2022-04-30 MED ORDER — SODIUM CHLORIDE 0.9 % IV SOLN
400.0000 mg | Freq: Once | INTRAVENOUS | Status: AC
  Administered 2022-04-30: 400 mg via INTRAVENOUS
  Filled 2022-04-30: qty 20

## 2022-04-30 NOTE — Patient Instructions (Signed)
[name]  Discharge Instructions: Thank you for choosing [place] to provide your oncology and hematology care.  If you have a lab appointment with the [facility], please come in thru the Main Entrance and check in at the main information desk.  Wear comfortable clothing and clothing appropriate for easy access to any Portacath or PICC line.   We strive to give you quality time with your provider. You may need to reschedule your appointment if you arrive late (15 or more minutes).  Arriving late affects you and other patients whose appointments are after yours.  Also, if you miss three or more appointments without notifying the office, you may be dismissed from the clinic at the provider's discretion.      For prescription refill requests, have your pharmacy contact our office and allow 72 hours for refills to be completed.    Today you received the following venofer, return as scheduled.   To help prevent nausea and vomiting after your treatment, we encourage you to take your nausea medication as directed.  BELOW ARE SYMPTOMS THAT SHOULD BE REPORTED IMMEDIATELY: *FEVER GREATER THAN 100.4 F (38 C) OR HIGHER *CHILLS OR SWEATING *NAUSEA AND VOMITING THAT IS NOT CONTROLLED WITH YOUR NAUSEA MEDICATION *UNUSUAL SHORTNESS OF BREATH *UNUSUAL BRUISING OR BLEEDING *URINARY PROBLEMS (pain or burning when urinating, or frequent urination) *BOWEL PROBLEMS (unusual diarrhea, constipation, pain near the anus) TENDERNESS IN MOUTH AND THROAT WITH OR WITHOUT PRESENCE OF ULCERS (sore throat, sores in mouth, or a toothache) UNUSUAL RASH, SWELLING OR PAIN  UNUSUAL VAGINAL DISCHARGE OR ITCHING   Items with * indicate a potential emergency and should be followed up as soon as possible or go to the Emergency Department if any problems should occur.  Please show the CHEMOTHERAPY ALERT CARD or IMMUNOTHERAPY ALERT CARD at check-in to the Emergency Department and triage  nurse.  Should you have questions after your visit or need to cancel or reschedule your appointment, please contact [place] [phone]  and follow the prompts.  Office hours are 8:00 a.m. to 4:30 p.m. Monday - Friday. Please note that voicemails left after 4:00 p.m. may not be returned until the following business day.  We are closed weekends and major holidays. You have access to a nurse at all times for urgent questions. Please call the main number to the clinic [phone] and follow the prompts.  For any non-urgent questions, you may also contact your provider using MyChart. We now offer e-Visits for anyone 21 and older to request care online for non-urgent symptoms. For details visit [email or web address].   Also download the MyChart app! Go to the app store, search "MyChart", open the app, select [place], and log in with your MyChart username and password.  Masks are optional in the cancer centers. If you would like for your care team to wear a mask while they are taking care of you, please let them know. You may have one support person who is at least 40 years old accompany you for your appointments.

## 2022-04-30 NOTE — Progress Notes (Signed)
Patient tolerated iron infusion with no complaints voiced. Peripheral IV site clean and dry with good blood return noted before and after infusion. Band aid applied. VSS with discharge and left in satisfactory condition with no s/s of distress noted.

## 2022-05-07 ENCOUNTER — Inpatient Hospital Stay: Payer: BC Managed Care – PPO

## 2022-05-09 DIAGNOSIS — M7918 Myalgia, other site: Secondary | ICD-10-CM | POA: Diagnosis not present

## 2022-05-09 DIAGNOSIS — M353 Polymyalgia rheumatica: Secondary | ICD-10-CM | POA: Diagnosis not present

## 2022-05-09 DIAGNOSIS — D509 Iron deficiency anemia, unspecified: Secondary | ICD-10-CM | POA: Diagnosis not present

## 2022-05-09 DIAGNOSIS — E039 Hypothyroidism, unspecified: Secondary | ICD-10-CM | POA: Diagnosis not present

## 2022-05-09 DIAGNOSIS — Z23 Encounter for immunization: Secondary | ICD-10-CM | POA: Diagnosis not present

## 2022-05-11 ENCOUNTER — Inpatient Hospital Stay: Payer: BC Managed Care – PPO | Attending: Hematology

## 2022-05-11 VITALS — BP 122/79 | HR 87 | Temp 97.5°F | Resp 18

## 2022-05-11 DIAGNOSIS — D509 Iron deficiency anemia, unspecified: Secondary | ICD-10-CM | POA: Diagnosis not present

## 2022-05-11 DIAGNOSIS — E559 Vitamin D deficiency, unspecified: Secondary | ICD-10-CM | POA: Insufficient documentation

## 2022-05-11 DIAGNOSIS — D500 Iron deficiency anemia secondary to blood loss (chronic): Secondary | ICD-10-CM

## 2022-05-11 DIAGNOSIS — D72829 Elevated white blood cell count, unspecified: Secondary | ICD-10-CM | POA: Insufficient documentation

## 2022-05-11 MED ORDER — LORATADINE 10 MG PO TABS
10.0000 mg | ORAL_TABLET | Freq: Every day | ORAL | Status: DC
  Administered 2022-05-11: 10 mg via ORAL
  Filled 2022-05-11: qty 1

## 2022-05-11 MED ORDER — ACETAMINOPHEN 325 MG PO TABS
650.0000 mg | ORAL_TABLET | Freq: Once | ORAL | Status: AC
  Administered 2022-05-11: 650 mg via ORAL
  Filled 2022-05-11: qty 2

## 2022-05-11 MED ORDER — SODIUM CHLORIDE 0.9 % IV SOLN
400.0000 mg | Freq: Once | INTRAVENOUS | Status: AC
  Administered 2022-05-11: 400 mg via INTRAVENOUS
  Filled 2022-05-11: qty 20

## 2022-05-11 MED ORDER — SODIUM CHLORIDE 0.9 % IV SOLN: Freq: Once | INTRAVENOUS | Status: AC

## 2022-05-11 MED ORDER — FAMOTIDINE 20 MG PO TABS
20.0000 mg | ORAL_TABLET | Freq: Once | ORAL | Status: AC
  Administered 2022-05-11: 20 mg via ORAL
  Filled 2022-05-11: qty 1

## 2022-05-11 NOTE — Progress Notes (Signed)
Patient tolerated iron infusion with no complaints voiced. Peripheral IV site clean and dry with good blood return noted before and after infusion. Band aid applied. VSS with discharge and left in satisfactory condition with no s/s of distress noted.

## 2022-05-11 NOTE — Patient Instructions (Signed)
[place]  Discharge Instructions: Thank you for choosing [place] to provide your oncology and hematology care.  If you have a lab appointment with the [facility], please come in thru the Main Entrance and check in at the main information desk.  Wear comfortable clothing and clothing appropriate for easy access to any Portacath or PICC line.   We strive to give you quality time with your provider. You may need to reschedule your appointment if you arrive late (15 or more minutes).  Arriving late affects you and other patients whose appointments are after yours.  Also, if you miss three or more appointments without notifying the office, you may be dismissed from the clinic at the provider's discretion.      For prescription refill requests, have your pharmacy contact our office and allow 72 hours for refills to be completed.    Today you received the following Venofer, return as scheduled.   To help prevent nausea and vomiting after your treatment, we encourage you to take your nausea medication as directed.  BELOW ARE SYMPTOMS THAT SHOULD BE REPORTED IMMEDIATELY: *FEVER GREATER THAN 100.4 F (38 C) OR HIGHER *CHILLS OR SWEATING *NAUSEA AND VOMITING THAT IS NOT CONTROLLED WITH YOUR NAUSEA MEDICATION *UNUSUAL SHORTNESS OF BREATH *UNUSUAL BRUISING OR BLEEDING *URINARY PROBLEMS (pain or burning when urinating, or frequent urination) *BOWEL PROBLEMS (unusual diarrhea, constipation, pain near the anus) TENDERNESS IN MOUTH AND THROAT WITH OR WITHOUT PRESENCE OF ULCERS (sore throat, sores in mouth, or a toothache) UNUSUAL RASH, SWELLING OR PAIN  UNUSUAL VAGINAL DISCHARGE OR ITCHING   Items with * indicate a potential emergency and should be followed up as soon as possible or go to the Emergency Department if any problems should occur.  Please show the CHEMOTHERAPY ALERT CARD or IMMUNOTHERAPY ALERT CARD at check-in to the Emergency Department and triage  nurse.  Should you have questions after your visit or need to cancel or reschedule your appointment, please contact [place] [phone]  and follow the prompts.  Office hours are 8:00 a.m. to 4:30 p.m. Monday - Friday. Please note that voicemails left after 4:00 p.m. may not be returned until the following business day.  We are closed weekends and major holidays. You have access to a nurse at all times for urgent questions. Please call the main number to the clinic [phone] and follow the prompts.  For any non-urgent questions, you may also contact your provider using MyChart. We now offer e-Visits for anyone 28 and older to request care online for non-urgent symptoms. For details visit [email or web address].   Also download the MyChart app! Go to the app store, search "MyChart", open the app, select [place], and log in with your MyChart username and password.  Masks are optional in the cancer centers. If you would like for your care team to wear a mask while they are taking care of you, please let them know. You may have one support person who is at least 40 years old accompany you for your appointments.

## 2022-05-18 DIAGNOSIS — E063 Autoimmune thyroiditis: Secondary | ICD-10-CM | POA: Diagnosis not present

## 2022-05-18 DIAGNOSIS — E039 Hypothyroidism, unspecified: Secondary | ICD-10-CM | POA: Diagnosis not present

## 2022-05-25 ENCOUNTER — Emergency Department (HOSPITAL_COMMUNITY)
Admission: EM | Admit: 2022-05-25 | Discharge: 2022-05-25 | Disposition: A | Discharge status: Home / Self Care | Payer: BC Managed Care – PPO | Attending: Emergency Medicine | Admitting: Emergency Medicine

## 2022-05-25 ENCOUNTER — Other Ambulatory Visit: Payer: Self-pay

## 2022-05-25 ENCOUNTER — Encounter (HOSPITAL_COMMUNITY): Payer: Self-pay | Admitting: Emergency Medicine

## 2022-05-25 ENCOUNTER — Emergency Department (HOSPITAL_COMMUNITY): Payer: BC Managed Care – PPO

## 2022-05-25 DIAGNOSIS — J45909 Unspecified asthma, uncomplicated: Secondary | ICD-10-CM | POA: Insufficient documentation

## 2022-05-25 DIAGNOSIS — Z13220 Encounter for screening for lipoid disorders: Secondary | ICD-10-CM | POA: Diagnosis not present

## 2022-05-25 DIAGNOSIS — R739 Hyperglycemia, unspecified: Secondary | ICD-10-CM | POA: Diagnosis not present

## 2022-05-25 DIAGNOSIS — R079 Chest pain, unspecified: Secondary | ICD-10-CM | POA: Diagnosis not present

## 2022-05-25 DIAGNOSIS — R002 Palpitations: Secondary | ICD-10-CM | POA: Diagnosis not present

## 2022-05-25 DIAGNOSIS — Z7951 Long term (current) use of inhaled steroids: Secondary | ICD-10-CM | POA: Insufficient documentation

## 2022-05-25 DIAGNOSIS — Z79899 Other long term (current) drug therapy: Secondary | ICD-10-CM | POA: Diagnosis not present

## 2022-05-25 DIAGNOSIS — R0602 Shortness of breath: Secondary | ICD-10-CM | POA: Diagnosis not present

## 2022-05-25 DIAGNOSIS — E039 Hypothyroidism, unspecified: Secondary | ICD-10-CM | POA: Diagnosis not present

## 2022-05-25 LAB — BASIC METABOLIC PANEL
Anion gap: 9 (ref 5–15)
BUN: 7 mg/dL (ref 6–20)
CO2: 27 mmol/L (ref 22–32)
Calcium: 8.7 mg/dL — ABNORMAL LOW (ref 8.9–10.3)
Chloride: 100 mmol/L (ref 98–111)
Creatinine, Ser: 0.52 mg/dL (ref 0.44–1.00)
GFR, Estimated: >60 mL/min (ref >60)
Glucose, Bld: 94 mg/dL (ref 70–99)
Potassium: 3.5 mmol/L (ref 3.5–5.1)
Sodium: 136 mmol/L (ref 135–145)

## 2022-05-25 LAB — CBC
HCT: 38.4 % (ref 36.0–46.0)
Hemoglobin: 12.6 g/dL (ref 12.0–15.0)
MCH: 29.6 pg (ref 26.0–34.0)
MCHC: 32.8 g/dL (ref 30.0–36.0)
MCV: 90.1 fL (ref 80.0–100.0)
Platelets: 361 10*3/uL (ref 150–400)
RBC: 4.26 MIL/uL (ref 3.87–5.11)
RDW: 14.7 % (ref 11.5–15.5)
WBC: 14.1 10*3/uL — ABNORMAL HIGH (ref 4.0–10.5)
nRBC: 0 % (ref 0.0–0.2)

## 2022-05-25 LAB — HEPATIC FUNCTION PANEL
ALT: 19 U/L (ref 0–44)
AST: 19 U/L (ref 15–41)
Albumin: 4 g/dL (ref 3.5–5.0)
Alkaline Phosphatase: 78 U/L (ref 38–126)
Bilirubin, Direct: <0.1 mg/dL (ref 0.0–0.2)
Total Bilirubin: 0.4 mg/dL (ref 0.3–1.2)
Total Protein: 7.4 g/dL (ref 6.5–8.1)

## 2022-05-25 LAB — TSH: TSH: 1.295 u[IU]/mL (ref 0.350–4.500)

## 2022-05-25 LAB — TROPONIN I (HIGH SENSITIVITY)
Troponin I (High Sensitivity): <2 ng/L (ref <18)
Troponin I (High Sensitivity): 2 ng/L (ref <18)

## 2022-05-25 NOTE — Discharge Instructions (Signed)
Please follow-up with your primary care doctor for your TSH results.  If you develop severe chest pain, shortness of breath please return to the ER.

## 2022-05-25 NOTE — ED Triage Notes (Signed)
Pt to ED c/o heart palpitations and chest pain that started Monday, pt went to PCP this morning and was told EKG was "fine" pt reports no aggravating or relieving factors.

## 2022-05-25 NOTE — ED Provider Notes (Signed)
[facility] EMERGENCY DEPARTMENT Provider Note   CSN: [id number] Arrival date & time: 05/25/22  1525     History  Chief Complaint  Patient presents with   Chest Pain    Maureen Yates is a 41 y.o. female, history of Hashimoto's, who presents to the ED secondary to chest discomfort x1 day, localized to the middle of her chest, that is persistent.  Also reports that for the last week she has had palpitations, and have not gone away, history of palpitations, but she states these have been persistent.  Also reports some shortness of breath that is all the time, but has history of asthma.  Denies any nausea, vomiting, back pain.  No radiation to the jaw or the neck.     Home Medications Prior to Admission medications   Medication Sig Start Date End Date Taking? Authorizing Provider  albuterol (VENTOLIN HFA) 108 (90 Base) MCG/ACT inhaler Inhale 2 puffs into the lungs every 6 (six) hours as needed. 03/26/21   [provider]  ALPRAZolam Duanne Moron) 0.5 MG tablet Take 0.25-0.5 mg by mouth as needed (start of tremor).    [provider]  fluconazole (DIFLUCAN) 150 MG tablet Take 150 mg by mouth every 30 (thirty) days. 08/18/18   [provider]  FLUoxetine (PROZAC) 20 MG capsule Take 20 mg by mouth daily. 01/18/21   [provider]  ibuprofen (ADVIL) 800 MG tablet Take 800 mg by mouth 3 (three) times daily. 11/20/20   [provider]  ketoconazole (NIZORAL) 2 % shampoo USE AS BODY [facility] AS DIRECTED 12/07/19   [provider]  lamoTRIgine (LAMICTAL) 100 MG tablet Take 100 mg by mouth daily.    [provider]  levothyroxine (SYNTHROID) 88 MCG tablet TAKE 1 TABLET BY MOUTH ONCE DAILY BEFORE BREAKFAST 05/23/20   Cassandria Anger, MD  mupirocin ointment (BACTROBAN) 2 % APPLY TO AFFECTED AREA TWICE A DAY 09/22/19   [provider]  SAXENDA 18 MG/3ML SOPN Inject into the skin. 09/14/21   [provider]  SUMAtriptan  (IMITREX) 50 MG tablet SMARTSIG:1 Tablet(s) By Mouth 1-2 Times Daily 03/22/21   [provider]  Vitamin D, Ergocalciferol, (DRISDOL) 1.25 MG (50000 UNIT) CAPS capsule TAKE 1 CAPSULE BY MOUTH ONE TIME PER WEEK 02/12/22   Derek Jack, MD      Allergies    Penicillins, Casein, and Augmentin [amoxicillin-pot clavulanate]    Review of Systems   Review of Systems  Constitutional:  Negative for chills and fever.  Cardiovascular:  Positive for chest pain.  Gastrointestinal:  Negative for nausea and vomiting.    Physical Exam Updated Vital Signs BP 135/81   Pulse 85   Temp 98.2 F (36.8 C)   Resp 18   Ht 5' 7"  (1.702 m)   Wt 124.7 kg   LMP 05/10/2022   SpO2 98%   BMI 43.07 kg/m  Physical Exam Vitals and nursing note reviewed.  Constitutional:      General: She is not in acute distress.    Appearance: She is well-developed. She is obese.  HENT:     Head: Normocephalic and atraumatic.  Eyes:     Conjunctiva/sclera: Conjunctivae normal.  Cardiovascular:     Rate and Rhythm: Normal rate and regular rhythm.     Heart sounds: No murmur heard. Pulmonary:     Effort: Pulmonary effort is normal. No respiratory distress.     Breath sounds: Normal breath sounds.  Abdominal:     Palpations: Abdomen is soft.  Tenderness: There is no abdominal tenderness.  Musculoskeletal:        General: No swelling.     Cervical back: Neck supple.  Skin:    General: Skin is warm and dry.     Capillary Refill: Capillary refill takes less than 2 seconds.  Neurological:     Mental Status: She is alert.  Psychiatric:        Mood and Affect: Mood normal.     ED Results / Procedures / Treatments   Labs (all labs ordered are listed, but only abnormal results are displayed) Labs Reviewed  BASIC METABOLIC PANEL - Abnormal; Notable for the following components:      Result Value   Calcium 8.7 (*)    All other components within normal limits  CBC - Abnormal; Notable for the  following components:   WBC 14.1 (*)    All other components within normal limits  HEPATIC FUNCTION PANEL  TSH  T4, FREE  TROPONIN I (HIGH SENSITIVITY)  TROPONIN I (HIGH SENSITIVITY)    EKG None  Radiology DG Chest 2 View  Result Date: 05/25/2022 CLINICAL DATA:  Chest pain. EXAM: CHEST - 2 VIEW COMPARISON:  Chest x-ray from September 07, 2016 is not retrievable at the time of dictation. FINDINGS: The heart size and mediastinal contours are within normal limits. Both lungs are clear. No visible pleural effusions or pneumothorax. No acute osseous abnormality. IMPRESSION: No active cardiopulmonary disease. Electronically Signed   By: Margaretha Sheffield M.D.   On: 05/25/2022 16:05    Procedures Procedures   Medications Ordered in ED Medications - No data to display  ED Course/ Medical Decision Making/ A&P                           Medical Decision Making Patient is a 41 year old female, here for palpitations for the last week.  Will obtain EKG, chest x-ray, TSH, and troponins for further evaluation.  Patient well-appearing, on exam.  Amount and/or Complexity of Data Reviewed Labs: ordered.    Details: Troponin within normal limits less than 2. Radiology: ordered.    Details: Chest x-ray clear. ECG/medicine tests:  Decision-making details documented in ED Course.    Details: Normal sinus rhythm. Discussion of management or test interpretation with external provider(s): Heart score of 1, low risk, troponin less than 2.  Discussed with patient, already has a referral to cardiology she will follow-up.  EKG shows normal sinus rhythm.  This may be anxiety related?  She is not hypoxic, not tachycardic and is not on birth control.  She notes that her shortness of breath is a chronic issue, and not a new issue.  She was advised to follow-up with primary care doctor, and return to the ER if she has chest pain, shortness of breath, swelling of 1 extremity.    Final Clinical Impression(s) / ED  Diagnoses Final diagnoses:  Palpitations    Rx / DC Orders ED Discharge Orders     None         Jakobee Brackins, Si Gaul, PA 05/25/22 1846    Isla Pence, MD 05/26/22 0028

## 2022-05-26 LAB — T4, FREE: Free T4: 0.89 ng/dL (ref 0.61–1.12)

## 2022-06-06 DIAGNOSIS — R002 Palpitations: Secondary | ICD-10-CM | POA: Diagnosis not present

## 2022-06-06 DIAGNOSIS — E039 Hypothyroidism, unspecified: Secondary | ICD-10-CM | POA: Diagnosis not present

## 2022-06-06 DIAGNOSIS — Z13220 Encounter for screening for lipoid disorders: Secondary | ICD-10-CM | POA: Diagnosis not present

## 2022-06-06 DIAGNOSIS — R079 Chest pain, unspecified: Secondary | ICD-10-CM | POA: Diagnosis not present

## 2022-06-13 DIAGNOSIS — I340 Nonrheumatic mitral (valve) insufficiency: Secondary | ICD-10-CM | POA: Diagnosis not present

## 2022-06-13 DIAGNOSIS — I071 Rheumatic tricuspid insufficiency: Secondary | ICD-10-CM | POA: Diagnosis not present

## 2022-06-13 DIAGNOSIS — R079 Chest pain, unspecified: Secondary | ICD-10-CM | POA: Diagnosis not present

## 2022-06-13 DIAGNOSIS — Z87898 Personal history of other specified conditions: Secondary | ICD-10-CM | POA: Diagnosis not present

## 2022-07-18 ENCOUNTER — Ambulatory Visit: Payer: BC Managed Care – PPO | Attending: Cardiology | Admitting: Cardiology

## 2022-07-18 ENCOUNTER — Encounter: Payer: Self-pay | Admitting: Cardiology

## 2022-07-18 VITALS — BP 128/84 | HR 101 | Ht 66.0 in | Wt 278.4 lb

## 2022-07-18 DIAGNOSIS — R002 Palpitations: Secondary | ICD-10-CM | POA: Diagnosis not present

## 2022-07-18 MED ORDER — METOPROLOL TARTRATE 25 MG PO TABS: 12.5000 mg | ORAL_TABLET | Freq: Two times a day (BID) | ORAL | 6 refills | Status: DC

## 2022-07-18 NOTE — Patient Instructions (Signed)
Medication Instructions:  Begin Lopressor 12.'5mg'$  twice a day   Continue all other medications.     Labwork: none  Testing/Procedures: none  Follow-Up: 4 months   Any Other Special Instructions Will Be Listed Below (If Applicable).   If you need a refill on your cardiac medications before your next appointment, please call your pharmacy.

## 2022-07-18 NOTE — Progress Notes (Signed)
Clinical Summary Ms. Haltiwanger is a 42 y.o.female seen today as a new consult, referred by Dr Jimmye Norman for the following medical problems.   1.Chest pain/palpitations - ER visit 05/2022 with chest pain and palpitations - trop neg, EKG SR nonspecific ST/T changes. CXR no acute process - 06/2022 echo [facility]: LVEF 60-65%, no significant abnormal findings    - mild episodes over the years - on Nov 13 more severe episode. - was at work doing hair, started feeling heart racing and skipping, pounding - lasted a few minutes, then resolved. Recurrent episode about 30 min later. Off and on the rest of the day - increasing over then next few days. - ER visit Nov 17 with symptoms, negative evaluation. EKG with NSR - TSH 1.295   - had some skin breakdown with pcp zio patch - monitor with just rare ectopy, isolated episode of SVT 4 beats - has weaned caffeine. Had been drinking cokes x 2 cans, no coffee, no tea, no energy drinks. No EtOH.  - still with symptoms, occurs daily. Few times a day.    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 Upset   Augmentin [Amoxicillin-Pot Clavulanate] Swelling and Rash     Current Outpatient Medications  Medication Sig Dispense Refill   albuterol (VENTOLIN HFA) 108 (90 Base) MCG/ACT inhaler Inhale 2 puffs into the lungs every 6 (six) hours as needed.     ALPRAZolam (XANAX) 0.5 MG tablet Take 0.25-0.5 mg by mouth as needed (start of tremor).     fluconazole (DIFLUCAN) 150 MG tablet Take 150 mg by mouth every 30 (thirty) days.     FLUoxetine (PROZAC) 20 MG  capsule Take 20 mg by mouth daily.     ibuprofen (ADVIL) 800 MG tablet Take 800 mg by mouth 3 (three) times daily.     ketoconazole (NIZORAL) 2 % shampoo USE AS BODY WASH AS DIRECTED     lamoTRIgine (LAMICTAL) 100 MG tablet Take 100 mg by mouth daily.     levothyroxine (SYNTHROID) 88 MCG tablet TAKE 1 TABLET BY MOUTH ONCE DAILY BEFORE BREAKFAST 90 tablet 1   mupirocin ointment (BACTROBAN) 2 % APPLY TO AFFECTED AREA TWICE A DAY     SAXENDA 18 MG/3ML SOPN Inject into the skin.     SUMAtriptan (IMITREX) 50 MG tablet SMARTSIG:1 Tablet(s) By Mouth 1-2 Times Daily     Vitamin D, Ergocalciferol, (DRISDOL) 1.25 MG (50000 UNIT) CAPS capsule TAKE 1 CAPSULE BY MOUTH ONE TIME PER WEEK 12 capsule 4   No current facility-administered medications for this visit.     Past Surgical History:  Procedure Laterality Date   CHOLECYSTECTOMY  Sept of 2012 gallstones   COLONOSCOPY  02/27/2012   Procedure: COLONOSCOPY;  Surgeon: Rogene Houston, MD;  Location: [facility];  Service: Endoscopy;  Laterality: N/A;  225   eptopic pregnancy  2015   rt ovary      removed 03/2010 for 4 pound tumor   TUBAL LIGATION Bilateral 04/2013  Allergies  Allergen Reactions   Penicillins Rash   Casein Other (See Comments)    G.I. Upset   Augmentin [Amoxicillin-Pot Clavulanate] Swelling and Rash      Family History  Problem Relation Age of Onset   Epilepsy Mother    Fibromyalgia Mother    Epilepsy Brother    Lupus Brother    Crohn's disease Father    COPD Father    [name]' disease Maternal Grandmother    Healthy Son    Allergy (severe) Son    Healthy Daughter    Parkinson's disease Maternal Great-grandfather      Social History Ms. Santiesteban reports that she has never smoked. She has never used smokeless tobacco. Ms. Runnion reports no history of alcohol use.   Review of Systems CONSTITUTIONAL: No weight loss, fever, chills, weakness or fatigue.  HEENT: Eyes: No visual loss, blurred vision,  double vision or yellow sclerae.No hearing loss, sneezing, congestion, runny nose or sore throat.  SKIN: No rash or itching.  CARDIOVASCULAR: per hpi RESPIRATORY: No shortness of breath, cough or sputum.  GASTROINTESTINAL: No anorexia, nausea, vomiting or diarrhea. No abdominal pain or blood.  GENITOURINARY: No burning on urination, no polyuria NEUROLOGICAL: No headache, dizziness, syncope, paralysis, ataxia, numbness or tingling in the extremities. No change in bowel or bladder control.  MUSCULOSKELETAL: No muscle, back pain, joint pain or stiffness.  LYMPHATICS: No enlarged nodes. No history of splenectomy.  PSYCHIATRIC: No history of depression or anxiety.  ENDOCRINOLOGIC: No reports of sweating, cold or heat intolerance. No polyuria or polydipsia.  Marland Kitchen   Physical Examination Today's Vitals   07/18/22 1500  BP: 128/84  Pulse: (!) 101  SpO2: 98%  Weight: 278 lb 6.4 oz (126.3 kg)  Height: '5\' 6"'$  (1.676 m)   Body mass index is 44.93 kg/m.  Gen: resting comfortably, no acute distress HEENT: no scleral icterus, pupils equal round and reactive, no palptable cervical adenopathy,  CV: RRR, no m/r/g, no jvd Resp: Clear to auscultation bilaterally GI: abdomen is soft, non-tender, non-distended, normal bowel sounds, no hepatosplenomegaly MSK: extremities are warm, no edema.  Skin: warm, no rash Neuro:  no focal deficits Psych: appropriate affect   Diagnostic Studies   06/2022 echo [facility] Summary  1. The left ventricle is normal in size with normal wall thickness.  2. The left ventricular systolic function is normal, LVEF is visually estimated at 60-65%.  3. The right ventricle is normal in size, with normal systolic function.   06/2022 monitor - Patient had a min HR of 60 bpm, max HR of 160 bpm, and avg HR of 88 bpm. Predominant underlying rhythm was Sinus Rhythm. 1 run of Supraventricular Tachycardia occurred lasting 4 beats with a max rate of 160 bpm (avg 157 bpm). Isolated  SVEs were rare (<1.0%), SVE Triplets were rare (<1.0%), and no SVE Couplets were present. Isolated VEs were rare (<1.0%), VE Couplets were rare (<1.0%), and no VE Triplets were present. Ventricular Bigeminy was present.  Final Interpretation  Rhythm was sinus with rare ectopic beats. Patient events correspond to sinus rhythm, mostly with isolated PVCs. No sustained arrhythmia or prolonged pauses present. Electronically signed by Dr. Jodelle Gross    Assessment and Plan   1.Palpitations - recent monitor with rare PACs, PVCs. Isolated run of SVT just 4 beats - ongoing symptoms, start lopressor 12.'5mg'$  bid, room to titrate if neccesary.  - has already weaned caffeine - echo was benign    F/u 4 months   Arnoldo Lenis, M.D.

## 2022-08-28 DIAGNOSIS — M25561 Pain in right knee: Secondary | ICD-10-CM | POA: Diagnosis not present

## 2022-08-30 DIAGNOSIS — Z9079 Acquired absence of other genital organ(s): Secondary | ICD-10-CM | POA: Diagnosis not present

## 2022-08-30 DIAGNOSIS — Z8041 Family history of malignant neoplasm of ovary: Secondary | ICD-10-CM | POA: Diagnosis not present

## 2022-08-30 DIAGNOSIS — Z01419 Encounter for gynecological examination (general) (routine) without abnormal findings: Secondary | ICD-10-CM | POA: Diagnosis not present

## 2022-10-10 DIAGNOSIS — E039 Hypothyroidism, unspecified: Secondary | ICD-10-CM | POA: Diagnosis not present

## 2022-10-10 DIAGNOSIS — E242 Drug-induced Cushing's syndrome: Secondary | ICD-10-CM | POA: Diagnosis not present

## 2022-10-10 DIAGNOSIS — Z92241 Personal history of systemic steroid therapy: Secondary | ICD-10-CM | POA: Diagnosis not present

## 2022-10-10 DIAGNOSIS — E063 Autoimmune thyroiditis: Secondary | ICD-10-CM | POA: Diagnosis not present

## 2022-10-12 ENCOUNTER — Encounter (HOSPITAL_COMMUNITY): Payer: Self-pay | Admitting: Hematology

## 2022-10-18 ENCOUNTER — Encounter (HOSPITAL_COMMUNITY): Payer: Self-pay | Admitting: Hematology

## 2022-10-18 ENCOUNTER — Inpatient Hospital Stay: Payer: BC Managed Care – PPO | Attending: Hematology

## 2022-10-18 DIAGNOSIS — D72829 Elevated white blood cell count, unspecified: Secondary | ICD-10-CM

## 2022-10-18 DIAGNOSIS — D509 Iron deficiency anemia, unspecified: Secondary | ICD-10-CM | POA: Diagnosis not present

## 2022-10-18 DIAGNOSIS — D500 Iron deficiency anemia secondary to blood loss (chronic): Secondary | ICD-10-CM

## 2022-10-18 LAB — CBC WITH DIFFERENTIAL/PLATELET
Abs Immature Granulocytes: 0.04 10*3/uL (ref 0.00–0.07)
Basophils Absolute: 0.1 10*3/uL (ref 0.0–0.1)
Basophils Relative: 1 %
Eosinophils Absolute: 0.3 10*3/uL (ref 0.0–0.5)
Eosinophils Relative: 3 %
HCT: 39.8 % (ref 36.0–46.0)
Hemoglobin: 12.8 g/dL (ref 12.0–15.0)
Immature Granulocytes: 0 %
Lymphocytes Relative: 18 %
Lymphs Abs: 1.9 10*3/uL (ref 0.7–4.0)
MCH: 29.8 pg (ref 26.0–34.0)
MCHC: 32.2 g/dL (ref 30.0–36.0)
MCV: 92.8 fL (ref 80.0–100.0)
Monocytes Absolute: 0.4 10*3/uL (ref 0.1–1.0)
Monocytes Relative: 4 %
Neutro Abs: 7.7 10*3/uL (ref 1.7–7.7)
Neutrophils Relative %: 74 %
Platelets: 368 10*3/uL (ref 150–400)
RBC: 4.29 MIL/uL (ref 3.87–5.11)
RDW: 14.4 % (ref 11.5–15.5)
WBC: 10.4 10*3/uL (ref 4.0–10.5)
nRBC: 0 % (ref 0.0–0.2)

## 2022-10-18 LAB — FERRITIN: Ferritin: 244 ng/mL (ref 11–307)

## 2022-10-18 LAB — IRON AND TIBC
Iron: 53 ug/dL (ref 28–170)
Saturation Ratios: 15 % (ref 10.4–31.8)
TIBC: 353 ug/dL (ref 250–450)
UIBC: 300 ug/dL

## 2022-10-18 LAB — C-REACTIVE PROTEIN: CRP: 1.4 mg/dL — ABNORMAL HIGH (ref <1.0)

## 2022-10-18 LAB — VITAMIN D 25 HYDROXY (VIT D DEFICIENCY, FRACTURES): Vit D, 25-Hydroxy: 56.85 ng/mL (ref 30–100)

## 2022-10-19 ENCOUNTER — Other Ambulatory Visit: Payer: BC Managed Care – PPO

## 2022-10-24 ENCOUNTER — Other Ambulatory Visit: Payer: BC Managed Care – PPO

## 2022-10-31 ENCOUNTER — Ambulatory Visit: Payer: BC Managed Care – PPO | Admitting: Hematology

## 2022-11-06 ENCOUNTER — Ambulatory Visit: Payer: BC Managed Care – PPO | Admitting: Nurse Practitioner

## 2022-11-13 ENCOUNTER — Inpatient Hospital Stay: Payer: BC Managed Care – PPO | Attending: Hematology | Admitting: Hematology

## 2022-11-13 VITALS — BP 132/82 | HR 85 | Temp 97.7°F | Resp 18 | Ht 67.0 in | Wt 278.0 lb

## 2022-11-13 DIAGNOSIS — K219 Gastro-esophageal reflux disease without esophagitis: Secondary | ICD-10-CM | POA: Insufficient documentation

## 2022-11-13 DIAGNOSIS — Z79899 Other long term (current) drug therapy: Secondary | ICD-10-CM | POA: Diagnosis not present

## 2022-11-13 DIAGNOSIS — D500 Iron deficiency anemia secondary to blood loss (chronic): Secondary | ICD-10-CM

## 2022-11-13 DIAGNOSIS — Z79890 Hormone replacement therapy (postmenopausal): Secondary | ICD-10-CM | POA: Diagnosis not present

## 2022-11-13 DIAGNOSIS — R5383 Other fatigue: Secondary | ICD-10-CM | POA: Diagnosis not present

## 2022-11-13 DIAGNOSIS — K589 Irritable bowel syndrome without diarrhea: Secondary | ICD-10-CM | POA: Diagnosis not present

## 2022-11-13 DIAGNOSIS — E559 Vitamin D deficiency, unspecified: Secondary | ICD-10-CM | POA: Diagnosis not present

## 2022-11-13 DIAGNOSIS — D509 Iron deficiency anemia, unspecified: Secondary | ICD-10-CM | POA: Diagnosis not present

## 2022-11-13 DIAGNOSIS — E063 Autoimmune thyroiditis: Secondary | ICD-10-CM | POA: Diagnosis not present

## 2022-11-13 DIAGNOSIS — K921 Melena: Secondary | ICD-10-CM | POA: Diagnosis not present

## 2022-11-13 DIAGNOSIS — D72829 Elevated white blood cell count, unspecified: Secondary | ICD-10-CM | POA: Diagnosis not present

## 2022-11-13 NOTE — Patient Instructions (Signed)
[facility] at [facility] Discharge Instructions   You were seen and examined today by Dr. Ellin Saba.  He reviewed the results of your lab work which are normal/stable.   We will see you back in 6 months. We will repeat your lab work prior to your next visit.    Thank you for choosing [facility] at [facility] to provide your oncology and hematology care.  To afford each patient quality time with our provider, please arrive at least 15 minutes before your scheduled appointment time.   If you have a lab appointment with the Cancer Center please come in thru the Main Entrance and check in at the main information desk.  You need to re-schedule your appointment should you arrive 10 or more minutes late.  We strive to give you quality time with our providers, and arriving late affects you and other patients whose appointments are after yours.  Also, if you no show three or more times for appointments you may be dismissed from the clinic at the providers discretion.     Again, thank you for choosing [facility].  Our hope is that these requests will decrease the amount of time that you wait before being seen by our physicians.       _____________________________________________________________  Should you have questions after your visit to [facility], please contact our office at [phone] and follow the prompts.  Our office hours are 8:00 a.m. and 4:30 p.m. Monday - Friday.  Please note that voicemails left after 4:00 p.m. may not be returned until the following business day.  We are closed weekends and major holidays.  You do have access to a nurse 24-7, just call the main number to the clinic [phone] and do not press any options, hold on the line and a nurse will answer the phone.    For prescription refill requests, have your pharmacy contact our office and allow 72 hours.    Due to Covid, you will need to wear a  mask upon entering the hospital. If you do not have a mask, a mask will be given to you at the Main Entrance upon arrival. For doctor visits, patients may have 1 support person age 13 or older with them. For treatment visits, patients can not have anyone with them due to social distancing guidelines and our immunocompromised population.

## 2022-11-13 NOTE — Progress Notes (Signed)
[facility] [address] [place] [address]    Clinic Day:  11/13/2022  Referring physician: Juliette Alcide, MD  Patient Care Team: Juliette Alcide, MD as PCP - General (Family Medicine) Wyline Mood Dorothe Pea, MD as PCP - Cardiology (Cardiology) Malissa Hippo, MD (Inactive) as Consulting Physician (Gastroenterology)   ASSESSMENT & PLAN:   Assessment: 1.  Iron deficiency anemia: - Patient reports she does have occasional bleeding per rectum. 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 -Last Feraheme on 03/13/2019 on 03/23/2019.   2.  Severe fatigue: -She was evaluated by rheumatology and was diagnosed with polymyalgia rheumatica and Sjogren's syndrome.    Plan: 1.  Iron deficiency anemia: - She has menses every other month, with very light bleeding. - Venofer 400 mg x 2 on 04/30/2022 and 05/11/2022.  She has tried taking oral iron in the past without improvement in iron levels. - Labs from 10/18/2022: Ferritin 244 and percent saturation 15.  Hemoglobin 12.8. - No indication for parenteral iron therapy. - RTC 6 months with repeat labs including ferritin and iron panel.   2.  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 - CRP is chronically elevated at 1.4 today. - WBC has normalized today at 10.4 with normal differential.   3.  Vitamin D deficiency: - Continue weekly vitamin D.  Vitamin D level is 56.    Orders Placed This Encounter  Procedures   CBC with Differential    Standing Status:   Future    Standing Expiration Date:   11/13/2023   VITAMIN D 25 Hydroxy (Vit-D Deficiency, Fractures)    Standing Status:   Future    Standing Expiration Date:   11/13/2023   Iron and TIBC ([facility] ONLY)    Standing  Status:   Future    Standing Expiration Date:   11/13/2023   Ferritin    Standing Status:   Future    Standing Expiration Date:   11/13/2023      I,Katie Daubenspeck,acting as a scribe for Doreatha Massed, MD.,have documented all relevant documentation on the behalf of Doreatha Massed, MD,as directed by  Doreatha Massed, MD while in the presence of Doreatha Massed, MD.   I, Doreatha Massed MD, have reviewed the above documentation for accuracy and completeness, and I agree with the above.   Doreatha Massed, MD   5/7/202412:29 PM  CHIEF COMPLAINT:   Diagnosis: IDA    Cancer Staging  No matching staging information was found for the patient.   Prior Therapy: none  Current Therapy:  Intermittent Venofer, last on 05/11/22    HISTORY OF PRESENT ILLNESS:   Oncology History   No history exists.     INTERVAL HISTORY:   Maureen Yates is a 42 y.o. female presenting to clinic today for follow up of IDA. She was last seen by me on 04/24/22.  Today, she states that she is doing well overall. Her appetite level is at 75%. Her energy level is at 75%.  PAST MEDICAL HISTORY:   Past Medical History: Past Medical History:  Diagnosis Date   Anxiety    Environmental allergies    GERD (gastroesophageal reflux disease)    Hashimoto's thyroiditis    IBS (irritable bowel syndrome)    Myofascial pain  Tremor    right arm   Tubal ectopic pregnancy     Surgical History: Past Surgical History:  Procedure Laterality Date   CHOLECYSTECTOMY  Sept of 2012 gallstones   COLONOSCOPY  02/27/2012   Procedure: COLONOSCOPY;  Surgeon: Malissa Hippo, MD;  [facility];  Service: Endoscopy;  Laterality: N/A;  225   eptopic pregnancy  2015   rt ovary      removed 03/2010 for 4 pound tumor   TUBAL LIGATION Bilateral 04/2013    Social History: Social History   Socioeconomic History   Marital status: Married    Spouse name: Barbara Cower   Number of children: 2   Years  of education: Not on file   Highest education level: Not on file  Occupational History   Not on file  Tobacco Use   Smoking status: Never   Smokeless tobacco: Never  Vaping Use   Vaping Use: Never used  Substance and Sexual Activity   Alcohol use: No   Drug use: No   Sexual activity: Yes  Other Topics Concern   Not on file  Social History Narrative   Lives at home with husband and children   Right handed   Caffeine: avg of 16 oz soda daily   Social Determinants of Health   Financial Resource Strain: Not on file  Food Insecurity: Not on file  Transportation Needs: Not on file  Physical Activity: Not on file  Stress: Not on file  Social Connections: Not on file  Intimate Partner Violence: Not on file    Family History: Family History  Problem Relation Age of Onset   Epilepsy Mother    Fibromyalgia Mother    Epilepsy Brother    Lupus Brother    Crohn's disease Father    COPD Father    Graves' disease Maternal Grandmother    Healthy Son    Allergy (severe) Son    Healthy Daughter    Parkinson's disease Maternal Great-grandfather     Current Medications:  Current Outpatient Medications:    albuterol (VENTOLIN HFA) 108 (90 Base) MCG/ACT inhaler, Inhale 2 puffs into the lungs every 6 (six) hours as needed., Disp: , Rfl:    ALPRAZolam (XANAX) 0.5 MG tablet, Take 0.25-0.5 mg by mouth as needed (start of tremor)., Disp: , Rfl:    fluconazole (DIFLUCAN) 150 MG tablet, Take 150 mg by mouth every 30 (thirty) days., Disp: , Rfl:    FLUoxetine (PROZAC) 10 MG capsule, Take 10 mg by mouth daily., Disp: , Rfl:    FLUoxetine (PROZAC) 20 MG capsule, Take 20 mg by mouth daily., Disp: , Rfl:    ibuprofen (ADVIL) 800 MG tablet, Take 800 mg by mouth 3 (three) times daily., Disp: , Rfl:    ketoconazole (NIZORAL) 2 % shampoo, USE AS BODY WASH AS DIRECTED, Disp: , Rfl:    lamoTRIgine (LAMICTAL) 100 MG tablet, Take 100 mg by mouth daily., Disp: , Rfl:    levothyroxine (SYNTHROID) 75 MCG  tablet, Take 75 mcg by mouth every morning., Disp: , Rfl:    metoprolol tartrate (LOPRESSOR) 25 MG tablet, Take 0.5 tablets (12.5 mg total) by mouth 2 (two) times daily., Disp: 30 tablet, Rfl: 6   mupirocin ointment (BACTROBAN) 2 %, APPLY TO AFFECTED AREA TWICE A DAY, Disp: , Rfl:    SAXENDA 18 MG/3ML SOPN, Inject into the skin., Disp: , Rfl:    SUMAtriptan (IMITREX) 50 MG tablet, SMARTSIG:1 Tablet(s) By Mouth 1-2 Times Daily, Disp: , Rfl:  Vitamin D, Ergocalciferol, (DRISDOL) 1.25 MG (50000 UNIT) CAPS capsule, TAKE 1 CAPSULE BY MOUTH ONE TIME PER WEEK, Disp: 12 capsule, Rfl: 4   WEGOVY 0.25 MG/0.5ML SOAJ, SMARTSIG:0.25 Milligram(s) SUB-Q Once a Week, Disp: , Rfl:    Allergies: Allergies  Allergen Reactions   Penicillins Rash   Casein Other (See Comments)    G.I. Upset   Augmentin [Amoxicillin-Pot Clavulanate] Swelling and Rash    REVIEW OF SYSTEMS:   Review of Systems  Constitutional:  Negative for chills, fatigue and fever.  HENT:   Negative for lump/mass, mouth sores, nosebleeds, sore throat and trouble swallowing.   Eyes:  Negative for eye problems.  Respiratory:  Negative for cough and shortness of breath.   Cardiovascular:  Positive for palpitations. Negative for chest pain and leg swelling.  Gastrointestinal:  Negative for abdominal pain, constipation, diarrhea, nausea and vomiting.  Genitourinary:  Negative for bladder incontinence, difficulty urinating, dysuria, frequency, hematuria and nocturia.   Musculoskeletal:  Negative for arthralgias, back pain, flank pain, myalgias and neck pain.  Skin:  Negative for itching and rash.  Neurological:  Negative for dizziness, headaches and numbness.  Hematological:  Does not bruise/bleed easily.  Psychiatric/Behavioral:  Negative for depression, sleep disturbance and suicidal ideas. The patient is not nervous/anxious.   All other systems reviewed and are negative.    VITALS:   Blood pressure 132/82, pulse 85, temperature 97.7 F  (36.5 C), temperature source Tympanic, resp. rate 18, height 5\' 7"  (1.702 m), weight 278 lb (126.1 kg), SpO2 98 %.  Wt Readings from Last 3 Encounters:  11/13/22 278 lb (126.1 kg)  07/18/22 278 lb 6.4 oz (126.3 kg)  05/25/22 275 lb (124.7 kg)    Body mass index is 43.54 kg/m.  Performance status (ECOG): 1 - Symptomatic but completely ambulatory  PHYSICAL EXAM:   Physical Exam Vitals and nursing note reviewed. Exam conducted with a chaperone present.  Constitutional:      Appearance: Normal appearance.  Cardiovascular:     Rate and Rhythm: Normal rate and regular rhythm.     Pulses: Normal pulses.     Heart sounds: Normal heart sounds.  Pulmonary:     Effort: Pulmonary effort is normal.     Breath sounds: Normal breath sounds.  Abdominal:     Palpations: Abdomen is soft. There is no hepatomegaly, splenomegaly or mass.     Tenderness: There is no abdominal tenderness.  Musculoskeletal:     Right lower leg: No edema.     Left lower leg: No edema.  Lymphadenopathy:     Cervical: No cervical adenopathy.     Right cervical: No superficial, deep or posterior cervical adenopathy.    Left cervical: No superficial, deep or posterior cervical adenopathy.     Upper Body:     Right upper body: No supraclavicular or axillary adenopathy.     Left upper body: No supraclavicular or axillary adenopathy.  Neurological:     General: No focal deficit present.     Mental Status: She is alert and oriented to person, place, and time.  Psychiatric:        Mood and Affect: Mood normal.        Behavior: Behavior normal.     LABS:      Latest Ref Rng & Units 10/18/2022   11:01 AM 05/25/2022    4:09 PM 04/17/2022   10:37 AM  CBC  WBC 4.0 - 10.5 K/uL 10.4  14.1  11.4   Hemoglobin 12.0 - 15.0  g/dL 16.1  09.6  04.5   Hematocrit 36.0 - 46.0 % 39.8  38.4  39.0   Platelets 150 - 400 K/uL 368  361  364       Latest Ref Rng & Units 05/25/2022    4:38 PM 05/25/2022    4:09 PM 10/02/2021     1:40 PM  CMP  Glucose 70 - 99 mg/dL  94  80   BUN 6 - 20 mg/dL  7  10   Creatinine 4.09 - 1.00 mg/dL  8.11  9.14   Sodium 782 - 145 mmol/L  136  139   Potassium 3.5 - 5.1 mmol/L  3.5  3.3   Chloride 98 - 111 mmol/L  100  104   CO2 22 - 32 mmol/L  27  25   Calcium 8.9 - 10.3 mg/dL  8.7  9.1   Total Protein 6.5 - 8.1 g/dL 7.4   7.6   Total Bilirubin 0.3 - 1.2 mg/dL 0.4   0.4   Alkaline Phos 38 - 126 U/L 78   68   AST 15 - 41 U/L 19   24   ALT 0 - 44 U/L 19   27      No results found for: "CEA1", "CEA" / No results found for: "CEA1", "CEA" No results found for: "PSA1" No results found for: "NFA213" No results found for: "CAN125"  No results found for: "TOTALPROTELP", "ALBUMINELP", "A1GS", "A2GS", "BETS", "BETA2SER", "GAMS", "MSPIKE", "SPEI" Lab Results  Component Value Date   TIBC 353 10/18/2022   TIBC 347 04/17/2022   TIBC 361 10/02/2021   FERRITIN 244 10/18/2022   FERRITIN 154 04/17/2022   FERRITIN 232 10/02/2021   IRONPCTSAT 15 10/18/2022   IRONPCTSAT 14 04/17/2022   IRONPCTSAT 14 10/02/2021   Lab Results  Component Value Date   LDH 169 09/22/2020   LDH 207 (H) 04/13/2020   LDH 185 12/08/2019     STUDIES:   No results found.

## 2022-12-04 ENCOUNTER — Encounter: Payer: Self-pay | Admitting: Nurse Practitioner

## 2022-12-04 ENCOUNTER — Ambulatory Visit: Payer: BC Managed Care – PPO | Attending: Nurse Practitioner | Admitting: Nurse Practitioner

## 2022-12-04 VITALS — BP 118/78 | HR 87 | Ht 67.0 in | Wt 279.6 lb

## 2022-12-04 DIAGNOSIS — Z87898 Personal history of other specified conditions: Secondary | ICD-10-CM | POA: Diagnosis not present

## 2022-12-04 DIAGNOSIS — D509 Iron deficiency anemia, unspecified: Secondary | ICD-10-CM | POA: Diagnosis not present

## 2022-12-04 NOTE — Patient Instructions (Addendum)

## 2022-12-04 NOTE — Progress Notes (Signed)
Office Visit    Patient Name: Maureen Yates Date of Encounter: 12/04/2022  PCP:  Juliette Alcide, MD   [facility]  Cardiologist:  Dina Rich, MD  Advanced Practice Provider:  No care team member to display Electrophysiologist:  None   Chief Complaint    Maureen Yates is a 42 y.o. female with a hx of palpitations/chest pain, IDA, Hashimoto's thyroiditis, early onset polymyalgia rheumatica, IBS, GERD, and morbid obesity, who presents today for 39-month follow-up.   Past Medical History    Past Medical History:  Diagnosis Date   Anxiety    Environmental allergies    GERD (gastroesophageal reflux disease)    Hashimoto's thyroiditis    IBS (irritable bowel syndrome)    Myofascial pain    Tremor    right arm   Tubal ectopic pregnancy    Past Surgical History:  Procedure Laterality Date   CHOLECYSTECTOMY  Sept of 2012 gallstones   COLONOSCOPY  02/27/2012   Procedure: COLONOSCOPY;  Surgeon: Malissa Hippo, MD;  [facility];  Service: Endoscopy;  Laterality: N/A;  225   eptopic pregnancy  2015   rt ovary      removed 03/2010 for 4 pound tumor   TUBAL LIGATION Bilateral 04/2013    Allergies  Allergies  Allergen Reactions   Penicillins Rash   Casein Other (See Comments)    G.I. Upset   Augmentin [Amoxicillin-Pot Clavulanate] Swelling and Rash    History of Present Illness    Maureen Yates is a 42 y.o. female with a PMH as mentioned above.  Visited [facility] ED in November 2023 with chest pain and palpitations.  Echocardiogram revealed EF normal, no significant abnormalities.  Troponins were negative.  EKG revealed sinus rhythm with nonspecific ST/T wave changes.  Chest x-ray negative.  Past monitor 06/2022 revealed rare ectopy with isolated episode of SVT only lasting 4 beats.  Last seen by Dr. Dina Rich on July 18, 2022.  She noted ongoing symptoms of palpitations.  Started on Lopressor 12.5 mg  twice daily.  Was encouraged to continue to wean caffeine consumption.  Today she presents for 42-month follow-up.  She states she is doing well.  Does have rare episodes of palpitations, brief in duration.  Much improved from last office visit per her report.  Denies any acute concerns or complaints for today's visit. Denies any chest pain, shortness of breath, syncope, presyncope, dizziness, orthopnea, PND, swelling or significant weight changes, acute bleeding, or claudication.  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    2D echocardiogram 06/2022 [facility]): 1.  LVEF 60 to 65% 2.  Normal right ventricular size and normal systolic function.  06/2022 monitor: Sinus rhythm with rare ectopic beats. Patient events corresponded to sinus rhythm, mostly with isolated PVCs. No sustained arrhythmia or prolonged pauses noted.   Recent Labs: 05/25/2022: ALT 19; BUN 7; Creatinine, Ser 0.52; Potassium 3.5; Sodium 136; TSH 1.295 10/18/2022: Hemoglobin 12.8; Platelets 368  Recent Lipid Panel No results found for: "CHOL", "TRIG", "HDL", "CHOLHDL", "VLDL", "LDLCALC", "LDLDIRECT"   Home Medications   Current Meds  Medication Sig   albuterol (VENTOLIN HFA) 108 (90 Base) MCG/ACT inhaler Inhale 2 puffs into the lungs every 6 (six) hours as needed.   ALPRAZolam (XANAX) 0.5 MG tablet Take 0.25-0.5 mg by mouth as needed (start of tremor).  fluconazole (DIFLUCAN) 150 MG tablet Take 150 mg by mouth every 30 (thirty) days.   FLUoxetine (PROZAC) 10 MG capsule Take 10 mg by mouth daily.   FLUoxetine (PROZAC) 20 MG capsule Take 20 mg by mouth daily.   ibuprofen (ADVIL) 800 MG tablet Take 800 mg by mouth 3 (three) times daily.   ketoconazole (NIZORAL) 2 % shampoo USE AS BODY WASH AS DIRECTED   lamoTRIgine (LAMICTAL) 100 MG tablet Take 100 mg by mouth daily.    levothyroxine (SYNTHROID) 75 MCG tablet Take 75 mcg by mouth every morning.   metoprolol tartrate (LOPRESSOR) 25 MG tablet Take 0.5 tablets (12.5 mg total) by mouth 2 (two) times daily.   mupirocin ointment (BACTROBAN) 2 % APPLY TO AFFECTED AREA TWICE A DAY   SAXENDA 18 MG/3ML SOPN Inject into the skin.   SUMAtriptan (IMITREX) 50 MG tablet SMARTSIG:1 Tablet(s) By Mouth 1-2 Times Daily   Vitamin D, Ergocalciferol, (DRISDOL) 1.25 MG (50000 UNIT) CAPS capsule TAKE 1 CAPSULE BY MOUTH ONE TIME PER WEEK   WEGOVY 0.25 MG/0.5ML SOAJ SMARTSIG:0.25 Milligram(s) SUB-Q Once a Week     Review of Systems    All other systems reviewed and are otherwise negative except as noted above.  Physical Exam    VS:  BP 118/78   Pulse 87   Ht 5\' 7"  (1.702 m)   Wt 279 lb 9.6 oz (126.8 kg)   SpO2 94%   BMI 43.79 kg/m  , BMI Body mass index is 43.79 kg/m.  Wt Readings from Last 3 Encounters:  12/04/22 279 lb 9.6 oz (126.8 kg)  11/13/22 278 lb (126.1 kg)  07/18/22 278 lb 6.4 oz (126.3 kg)     GEN: Morbidly obese, 42 year old female in no acute distress. HEENT: normal. Neck: Supple, no JVD, carotid bruits, or masses. Cardiac: S1/S2, RRR, no murmurs, rubs, or gallops. No clubbing, cyanosis, edema.  Radials/PT 2+ and equal bilaterally.  Respiratory:  Respirations regular and unlabored, clear to auscultation bilaterally. GI: Soft, nontender, nondistended. MS: No deformity or atrophy. Skin: Warm and dry, no rash. Neuro:  Strength and sensation are intact. Psych: Normal affect.  Assessment & Plan    History of palpitations Does note rare episodes of palpitations, brief in duration.  Continue Lopressor 12.5 mg twice daily.  Tolerating well. Heart healthy diet and regular cardiovascular exercise encouraged.    2. IDA Denies any issues.  This is being managed by Dr. Ellin Saba.  Continue follow-up with Hem/Onc.  Morbid obesity BMI 43.79. Weight loss via diet and exercise encouraged. Discussed the impact  being overweight would have on cardiovascular risk.  Continue to follow-up with PCP.  Continue [id number].   Disposition: Follow up in 1 year(s) with Dina Rich, MD or [facility].  Signed, Sharlene Dory, NP 12/04/2022, 12:51 PM [facility]

## 2022-12-28 ENCOUNTER — Encounter (HOSPITAL_COMMUNITY): Payer: Self-pay | Admitting: Hematology

## 2023-01-03 DIAGNOSIS — M3505 Sjogren syndrome with inflammatory arthritis: Secondary | ICD-10-CM | POA: Diagnosis not present

## 2023-01-03 DIAGNOSIS — Z6841 Body Mass Index (BMI) 40.0 and over, adult: Secondary | ICD-10-CM | POA: Diagnosis not present

## 2023-01-03 DIAGNOSIS — M353 Polymyalgia rheumatica: Secondary | ICD-10-CM | POA: Diagnosis not present

## 2023-01-03 DIAGNOSIS — G43909 Migraine, unspecified, not intractable, without status migrainosus: Secondary | ICD-10-CM | POA: Diagnosis not present

## 2023-03-22 ENCOUNTER — Other Ambulatory Visit: Payer: Self-pay | Admitting: Cardiology

## 2023-04-18 ENCOUNTER — Other Ambulatory Visit: Payer: Self-pay | Admitting: Hematology

## 2023-04-18 DIAGNOSIS — E559 Vitamin D deficiency, unspecified: Secondary | ICD-10-CM

## 2023-05-16 ENCOUNTER — Inpatient Hospital Stay: Payer: BC Managed Care – PPO | Attending: Hematology

## 2023-05-16 DIAGNOSIS — D500 Iron deficiency anemia secondary to blood loss (chronic): Secondary | ICD-10-CM

## 2023-05-16 DIAGNOSIS — Z79899 Other long term (current) drug therapy: Secondary | ICD-10-CM | POA: Insufficient documentation

## 2023-05-16 DIAGNOSIS — D509 Iron deficiency anemia, unspecified: Secondary | ICD-10-CM | POA: Diagnosis not present

## 2023-05-16 DIAGNOSIS — D72829 Elevated white blood cell count, unspecified: Secondary | ICD-10-CM | POA: Diagnosis not present

## 2023-05-16 DIAGNOSIS — E559 Vitamin D deficiency, unspecified: Secondary | ICD-10-CM | POA: Diagnosis not present

## 2023-05-16 LAB — CBC WITH DIFFERENTIAL/PLATELET
Abs Immature Granulocytes: 0.06 10*3/uL (ref 0.00–0.07)
Basophils Absolute: 0.1 10*3/uL (ref 0.0–0.1)
Basophils Relative: 1 %
Eosinophils Absolute: 0.4 10*3/uL (ref 0.0–0.5)
Eosinophils Relative: 3 %
HCT: 38.8 % (ref 36.0–46.0)
Hemoglobin: 12.2 g/dL (ref 12.0–15.0)
Immature Granulocytes: 1 %
Lymphocytes Relative: 15 %
Lymphs Abs: 2 10*3/uL (ref 0.7–4.0)
MCH: 28.5 pg (ref 26.0–34.0)
MCHC: 31.4 g/dL (ref 30.0–36.0)
MCV: 90.7 fL (ref 80.0–100.0)
Monocytes Absolute: 0.5 10*3/uL (ref 0.1–1.0)
Monocytes Relative: 4 %
Neutro Abs: 10.2 10*3/uL — ABNORMAL HIGH (ref 1.7–7.7)
Neutrophils Relative %: 76 %
Platelets: 373 10*3/uL (ref 150–400)
RBC: 4.28 MIL/uL (ref 3.87–5.11)
RDW: 14.6 % (ref 11.5–15.5)
WBC: 13.2 10*3/uL — ABNORMAL HIGH (ref 4.0–10.5)
nRBC: 0 % (ref 0.0–0.2)

## 2023-05-16 LAB — FERRITIN: Ferritin: 174 ng/mL (ref 11–307)

## 2023-05-16 LAB — IRON AND TIBC
Iron: 52 ug/dL (ref 28–170)
Saturation Ratios: 15 % (ref 10.4–31.8)
TIBC: 346 ug/dL (ref 250–450)
UIBC: 294 ug/dL

## 2023-05-16 LAB — VITAMIN D 25 HYDROXY (VIT D DEFICIENCY, FRACTURES): Vit D, 25-Hydroxy: 39.41 ng/mL (ref 30–100)

## 2023-05-23 ENCOUNTER — Inpatient Hospital Stay: Payer: BC Managed Care – PPO | Admitting: Hematology

## 2023-05-23 VITALS — BP 135/89 | HR 92 | Temp 98.2°F | Resp 16 | Wt 287.4 lb

## 2023-05-23 DIAGNOSIS — D500 Iron deficiency anemia secondary to blood loss (chronic): Secondary | ICD-10-CM | POA: Diagnosis not present

## 2023-05-23 DIAGNOSIS — D72829 Elevated white blood cell count, unspecified: Secondary | ICD-10-CM

## 2023-05-23 DIAGNOSIS — Z79899 Other long term (current) drug therapy: Secondary | ICD-10-CM | POA: Diagnosis not present

## 2023-05-23 DIAGNOSIS — E559 Vitamin D deficiency, unspecified: Secondary | ICD-10-CM | POA: Diagnosis not present

## 2023-05-23 DIAGNOSIS — D509 Iron deficiency anemia, unspecified: Secondary | ICD-10-CM | POA: Diagnosis not present

## 2023-05-23 NOTE — Progress Notes (Signed)
[facility] [address], [place] [address]    Clinic Day:  05/23/2023  Referring physician: Juliette Alcide, MD  Patient Care Team: Juliette Alcide, MD as PCP - General (Family Medicine) Wyline Mood Dorothe Pea, MD as PCP - Cardiology (Cardiology) Malissa Hippo, MD (Inactive) as Consulting Physician (Gastroenterology)   ASSESSMENT & PLAN:   Assessment: 1.  Iron deficiency anemia: - Patient reports she does have occasional bleeding per rectum. 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 -Last Feraheme on 03/13/2019 on 03/23/2019.   2.  Severe fatigue: -She was evaluated by rheumatology and was diagnosed with polymyalgia rheumatica and Sjogren's syndrome.    Plan: 1.  Iron deficiency anemia: - She cannot tolerate oral iron therapy. - Ferritin is 174, down from 244.  Hemoglobin is stable at 12.2.  She reports decreased energy levels. - She also reports itching generalized for the last 2 months with no new rash.  She has eczematous rash mostly on the trunk for many years.  She will contact eczema specialist at a tertiary clinic. - I have recommended Feraheme 510 mg x 1. - RTC 6 months for follow-up with repeat labs.   2.  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  CRP is chronically elevated. - White count today is 13.2, with ANC of 10.2.   3.  Vitamin D deficiency: - Continue weekly vitamin D.  Level is 39.    Orders Placed This Encounter  Procedures   CBC with Differential    Standing Status:   Future    Standing Expiration Date:   05/22/2024   Iron and TIBC ([facility] ONLY)    Standing Status:   Future    Standing Expiration Date:   05/22/2024   Ferritin    Standing Status:   Future     Standing Expiration Date:   05/22/2024      Mikeal Hawthorne R Teague,acting as a scribe for Doreatha Massed, MD.,have documented all relevant documentation on the behalf of Doreatha Massed, MD,as directed by  Doreatha Massed, MD while in the presence of Doreatha Massed, MD.  I, Doreatha Massed MD, have reviewed the above documentation for accuracy and completeness, and I agree with the above.    Doreatha Massed, MD   11/14/20243:48 PM  CHIEF COMPLAINT:   Diagnosis: IDA    Cancer Staging  No matching staging information was found for the patient.    Prior Therapy: none  Current Therapy:  Intermittent Venofer, last on 05/11/22    HISTORY OF PRESENT ILLNESS:   Oncology History   No history exists.     INTERVAL HISTORY:   Maureen Yates is a 42 y.o. female presenting to clinic today for follow up of IDA. She was last seen by me on 11/13/22.  Today, she states that she is doing well overall. Her appetite level is at 80%. Her energy level is at 50%.  Her energy levels have slightly worsened since her last visit. She is taking Vitamin D as prescribed. Her menstrual cycle occurs every other month and is light. She denies any ice pica or other cravings. She states she felt better after receiving Feraheme, including having more energy, than when she received Venofer.   She notes she has had intermittent extreme itching of the  skin over the last 2 months. She notes accompanied erythema of the skin and a flushing sensation. She denies any changes in body care products. She has erythematous scabbing lesions the last 3 years. She has seen 3 dermatologist and tried multiple medications and ointments without resolve.  PAST MEDICAL HISTORY:   Past Medical History: Past Medical History:  Diagnosis Date   Anxiety    Environmental allergies    GERD (gastroesophageal reflux disease)    Hashimoto's thyroiditis    IBS (irritable bowel syndrome)    Myofascial pain    Tremor     right arm   Tubal ectopic pregnancy     Surgical History: Past Surgical History:  Procedure Laterality Date   CHOLECYSTECTOMY  Sept of 2012 gallstones   COLONOSCOPY  02/27/2012   Procedure: COLONOSCOPY;  Surgeon: Malissa Hippo, MD;  [facility];  Service: Endoscopy;  Laterality: N/A;  225   eptopic pregnancy  2015   rt ovary      removed 03/2010 for 4 pound tumor   TUBAL LIGATION Bilateral 04/2013    Social History: Social History   Socioeconomic History   Marital status: Married    Spouse name: Barbara Cower   Number of children: 2   Years of education: Not on file   Highest education level: Not on file  Occupational History   Not on file  Tobacco Use   Smoking status: Never   Smokeless tobacco: Never  Vaping Use   Vaping status: Never Used  Substance and Sexual Activity   Alcohol use: No   Drug use: No   Sexual activity: Yes  Other Topics Concern   Not on file  Social History Narrative   Lives at home with husband and children   Right handed   Caffeine: avg of 16 oz soda daily   Social Determinants of Health   Financial Resource Strain: Not on file  Food Insecurity: Not on file  Transportation Needs: Not on file  Physical Activity: Not on file  Stress: Not on file  Social Connections: Not on file  Intimate Partner Violence: Not At Risk (08/30/2022)   Received from [facility], [facility]   Humiliation, Afraid, Rape, and Kick questionnaire    Fear of Current or Ex-Partner: No    Emotionally Abused: No    Physically Abused: No    Sexually Abused: No    Family History: Family History  Problem Relation Age of Onset   Epilepsy Mother    Fibromyalgia Mother    Epilepsy Brother    Lupus Brother    Crohn's disease Father    COPD Father    Graves' disease Maternal Grandmother    Healthy Son    Allergy (severe) Son    Healthy Daughter    Parkinson's disease Maternal Great-grandfather     Current Medications:  Current Outpatient  Medications:    albuterol (VENTOLIN HFA) 108 (90 Base) MCG/ACT inhaler, Inhale 2 puffs into the lungs every 6 (six) hours as needed., Disp: , Rfl:    ALPRAZolam (XANAX) 0.5 MG tablet, Take 0.25-0.5 mg by mouth as needed (start of tremor)., Disp: , Rfl:    fluconazole (DIFLUCAN) 150 MG tablet, Take 150 mg by mouth every 30 (thirty) days., Disp: , Rfl:    FLUoxetine (PROZAC) 10 MG capsule, Take 10 mg by mouth daily., Disp: , Rfl:    FLUoxetine (PROZAC) 20 MG capsule, Take 20 mg by mouth daily., Disp: , Rfl:    ibuprofen (ADVIL)  800 MG tablet, Take 800 mg by mouth 3 (three) times daily., Disp: , Rfl:    ketoconazole (NIZORAL) 2 % shampoo, USE AS BODY WASH AS DIRECTED, Disp: , Rfl:    lamoTRIgine (LAMICTAL) 100 MG tablet, Take 100 mg by mouth daily., Disp: , Rfl:    levothyroxine (SYNTHROID) 75 MCG tablet, Take 75 mcg by mouth every morning., Disp: , Rfl:    metoprolol tartrate (LOPRESSOR) 25 MG tablet, TAKE 0.5 TABLETS BY MOUTH 2 TIMES DAILY., Disp: 90 tablet, Rfl: 2   mupirocin ointment (BACTROBAN) 2 %, APPLY TO AFFECTED AREA TWICE A DAY, Disp: , Rfl:    SUMAtriptan (IMITREX) 50 MG tablet, SMARTSIG:1 Tablet(s) By Mouth 1-2 Times Daily, Disp: , Rfl:    Vitamin D, Ergocalciferol, (DRISDOL) 1.25 MG (50000 UNIT) CAPS capsule, TAKE 1 CAPSULE BY MOUTH ONE TIME PER WEEK, Disp: 12 capsule, Rfl: 4   Allergies: Allergies  Allergen Reactions   Penicillins Rash   Casein Other (See Comments)    G.I. Upset   Augmentin [Amoxicillin-Pot Clavulanate] Swelling and Rash    REVIEW OF SYSTEMS:   Review of Systems  Constitutional:  Negative for chills, fatigue and fever.  HENT:   Negative for lump/mass, mouth sores, nosebleeds, sore throat and trouble swallowing.   Eyes:  Negative for eye problems.  Respiratory:  Negative for cough and shortness of breath.   Cardiovascular:  Negative for chest pain, leg swelling and palpitations.  Gastrointestinal:  Negative for abdominal pain, constipation, diarrhea, nausea  and vomiting.  Genitourinary:  Negative for bladder incontinence, difficulty urinating, dysuria, frequency, hematuria and nocturia.   Musculoskeletal:  Negative for arthralgias, back pain, flank pain, myalgias and neck pain.  Skin:  Negative for itching and rash.  Neurological:  Positive for headaches and numbness (in arms and elbows). Negative for dizziness.  Hematological:  Does not bruise/bleed easily.  Psychiatric/Behavioral:  Positive for sleep disturbance. Negative for depression and suicidal ideas. The patient is not nervous/anxious.   All other systems reviewed and are negative.    VITALS:   Blood pressure 135/89, pulse 92, temperature 98.2 F (36.8 C), temperature source Oral, resp. rate 16, weight 287 lb 6.4 oz (130.4 kg), SpO2 98%.  Wt Readings from Last 3 Encounters:  05/23/23 287 lb 6.4 oz (130.4 kg)  12/04/22 279 lb 9.6 oz (126.8 kg)  11/13/22 278 lb (126.1 kg)    Body mass index is 45.01 kg/m.  Performance status (ECOG): 1 - Symptomatic but completely ambulatory  PHYSICAL EXAM:   Physical Exam Vitals and nursing note reviewed. Exam conducted with a chaperone present.  Constitutional:      Appearance: Normal appearance.  Cardiovascular:     Rate and Rhythm: Normal rate and regular rhythm.     Pulses: Normal pulses.     Heart sounds: Normal heart sounds.  Pulmonary:     Effort: Pulmonary effort is normal.     Breath sounds: Normal breath sounds.  Abdominal:     Palpations: Abdomen is soft. There is no hepatomegaly, splenomegaly or mass.     Tenderness: There is no abdominal tenderness.  Musculoskeletal:     Right lower leg: No edema.     Left lower leg: No edema.  Lymphadenopathy:     Cervical: No cervical adenopathy.     Right cervical: No superficial, deep or posterior cervical adenopathy.    Left cervical: No superficial, deep or posterior cervical adenopathy.     Upper Body:     Right upper body: No supraclavicular or  axillary adenopathy.     Left  upper body: No supraclavicular or axillary adenopathy.  Skin:         Comments: +Erythematous scabbing lesions on bilateral arms, chest, abdomen, and pelvis (eczema)  Neurological:     General: No focal deficit present.     Mental Status: She is alert and oriented to person, place, and time.  Psychiatric:        Mood and Affect: Mood normal.        Behavior: Behavior normal.     LABS:      Latest Ref Rng & Units 05/16/2023    9:58 AM 10/18/2022   11:01 AM 05/25/2022    4:09 PM  CBC  WBC 4.0 - 10.5 K/uL 13.2  10.4  14.1   Hemoglobin 12.0 - 15.0 g/dL 84.6  96.2  95.2   Hematocrit 36.0 - 46.0 % 38.8  39.8  38.4   Platelets 150 - 400 K/uL 373  368  361       Latest Ref Rng & Units 05/25/2022    4:38 PM 05/25/2022    4:09 PM 10/02/2021    1:40 PM  CMP  Glucose 70 - 99 mg/dL  94  80   BUN 6 - 20 mg/dL  7  10   Creatinine 8.41 - 1.00 mg/dL  3.24  4.01   Sodium 027 - 145 mmol/L  136  139   Potassium 3.5 - 5.1 mmol/L  3.5  3.3   Chloride 98 - 111 mmol/L  100  104   CO2 22 - 32 mmol/L  27  25   Calcium 8.9 - 10.3 mg/dL  8.7  9.1   Total Protein 6.5 - 8.1 g/dL 7.4   7.6   Total Bilirubin 0.3 - 1.2 mg/dL 0.4   0.4   Alkaline Phos 38 - 126 U/L 78   68   AST 15 - 41 U/L 19   24   ALT 0 - 44 U/L 19   27      No results found for: "CEA1", "CEA" / No results found for: "CEA1", "CEA" No results found for: "PSA1" No results found for: "OZD664" No results found for: "CAN125"  No results found for: "TOTALPROTELP", "ALBUMINELP", "A1GS", "A2GS", "BETS", "BETA2SER", "GAMS", "MSPIKE", "SPEI" Lab Results  Component Value Date   TIBC 346 05/16/2023   TIBC 353 10/18/2022   TIBC 347 04/17/2022   FERRITIN 174 05/16/2023   FERRITIN 244 10/18/2022   FERRITIN 154 04/17/2022   IRONPCTSAT 15 05/16/2023   IRONPCTSAT 15 10/18/2022   IRONPCTSAT 14 04/17/2022   Lab Results  Component Value Date   LDH 169 09/22/2020   LDH 207 (H) 04/13/2020   LDH 185 12/08/2019     STUDIES:   No  results found.

## 2023-05-23 NOTE — Patient Instructions (Addendum)
[facility] at [facility] Discharge Instructions   You were seen and examined today by Dr. Ellin Saba.  He reviewed the results of your lab work which was normal/stable. Your iron is normal but has dropped from the last time it was checked.   We will arrange for you to have one IV iron infusion, Feraheme.   We will see you back in 6 months. We will repeat lab work prior to your next visit.   Return as scheduled.    Thank you for choosing [facility] at [facility] to provide your oncology and hematology care.  To afford each patient quality time with our provider, please arrive at least 15 minutes before your scheduled appointment time.   If you have a lab appointment with the Cancer Center please come in thru the Main Entrance and check in at the main information desk.  You need to re-schedule your appointment should you arrive 10 or more minutes late.  We strive to give you quality time with our providers, and arriving late affects you and other patients whose appointments are after yours.  Also, if you no show three or more times for appointments you may be dismissed from the clinic at the providers discretion.     Again, thank you for choosing [facility].  Our hope is that these requests will decrease the amount of time that you wait before being seen by our physicians.       _____________________________________________________________  Should you have questions after your visit to [facility], please contact our office at [phone] and follow the prompts.  Our office hours are 8:00 a.m. and 4:30 p.m. Monday - Friday.  Please note that voicemails left after 4:00 p.m. may not be returned until the following business day.  We are closed weekends and major holidays.  You do have access to a nurse 24-7, just call the main number to the clinic [phone] and do not press any options, hold on the line and a nurse  will answer the phone.    For prescription refill requests, have your pharmacy contact our office and allow 72 hours.    Due to Covid, you will need to wear a mask upon entering the hospital. If you do not have a mask, a mask will be given to you at the Main Entrance upon arrival. For doctor visits, patients may have 1 support person age 41 or older with them. For treatment visits, patients can not have anyone with them due to social distancing guidelines and our immunocompromised population.

## 2023-05-29 ENCOUNTER — Inpatient Hospital Stay: Payer: BC Managed Care – PPO

## 2023-05-29 VITALS — BP 115/77 | HR 70 | Temp 98.5°F | Resp 18

## 2023-05-29 DIAGNOSIS — E559 Vitamin D deficiency, unspecified: Secondary | ICD-10-CM | POA: Diagnosis not present

## 2023-05-29 DIAGNOSIS — Z79899 Other long term (current) drug therapy: Secondary | ICD-10-CM | POA: Diagnosis not present

## 2023-05-29 DIAGNOSIS — D72829 Elevated white blood cell count, unspecified: Secondary | ICD-10-CM | POA: Diagnosis not present

## 2023-05-29 DIAGNOSIS — D509 Iron deficiency anemia, unspecified: Secondary | ICD-10-CM | POA: Diagnosis not present

## 2023-05-29 DIAGNOSIS — D500 Iron deficiency anemia secondary to blood loss (chronic): Secondary | ICD-10-CM

## 2023-05-29 MED ORDER — FAMOTIDINE 20 MG PO TABS
20.0000 mg | ORAL_TABLET | Freq: Once | ORAL | Status: AC
  Administered 2023-05-29: 20 mg via ORAL
  Filled 2023-05-29: qty 1

## 2023-05-29 MED ORDER — LORATADINE 10 MG PO TABS: 10.0000 mg | ORAL_TABLET | Freq: Every day | ORAL | Status: DC

## 2023-05-29 MED ORDER — SODIUM CHLORIDE 0.9 % IV SOLN: Freq: Once | INTRAVENOUS | Status: AC

## 2023-05-29 MED ORDER — SODIUM CHLORIDE 0.9 % IV SOLN
510.0000 mg | Freq: Once | INTRAVENOUS | Status: AC
  Administered 2023-05-29: 510 mg via INTRAVENOUS
  Filled 2023-05-29: qty 17

## 2023-05-29 MED ORDER — ACETAMINOPHEN 325 MG PO TABS
650.0000 mg | ORAL_TABLET | Freq: Once | ORAL | Status: AC
  Administered 2023-05-29: 650 mg via ORAL
  Filled 2023-05-29: qty 2

## 2023-05-29 MED ORDER — CETIRIZINE HCL 10 MG PO TABS
10.0000 mg | ORAL_TABLET | Freq: Once | ORAL | Status: AC
  Administered 2023-05-29: 10 mg via ORAL
  Filled 2023-05-29: qty 1

## 2023-05-29 NOTE — Patient Instructions (Signed)
[facility] - A DEPT OF [facility]  Discharge Instructions: Thank you for choosing [facility] to provide your oncology and hematology care.  If you have a lab appointment with the Cancer Center - please note that after April 8th, 2024, all labs will be drawn in the cancer center.  You do not have to check in or register with the main entrance as you have in the past but will complete your check-in in the cancer center.  Wear comfortable clothing and clothing appropriate for easy access to any Portacath or PICC line.   We strive to give you quality time with your provider. You may need to reschedule your appointment if you arrive late (15 or more minutes).  Arriving late affects you and other patients whose appointments are after yours.  Also, if you miss three or more appointments without notifying the office, you may be dismissed from the clinic at the provider's discretion.      For prescription refill requests, have your pharmacy contact our office and allow 72 hours for refills to be completed.    Today you received the following Feraheme, return as scheduled.   To help prevent nausea and vomiting after your treatment, we encourage you to take your nausea medication as directed.  BELOW ARE SYMPTOMS THAT SHOULD BE REPORTED IMMEDIATELY: *FEVER GREATER THAN 100.4 F (38 C) OR HIGHER *CHILLS OR SWEATING *NAUSEA AND VOMITING THAT IS NOT CONTROLLED WITH YOUR NAUSEA MEDICATION *UNUSUAL SHORTNESS OF BREATH *UNUSUAL BRUISING OR BLEEDING *URINARY PROBLEMS (pain or burning when urinating, or frequent urination) *BOWEL PROBLEMS (unusual diarrhea, constipation, pain near the anus) TENDERNESS IN MOUTH AND THROAT WITH OR WITHOUT PRESENCE OF ULCERS (sore throat, sores in mouth, or a toothache) UNUSUAL RASH, SWELLING OR PAIN  UNUSUAL VAGINAL DISCHARGE OR ITCHING   Items with * indicate a potential emergency and should be followed up as soon as possible  or go to the Emergency Department if any problems should occur.  Please show the CHEMOTHERAPY ALERT CARD or IMMUNOTHERAPY ALERT CARD at check-in to the Emergency Department and triage nurse.  Should you have questions after your visit or need to cancel or reschedule your appointment, please contact [facility] - A DEPT OF [facility] [phone]  and follow the prompts.  Office hours are 8:00 a.m. to 4:30 p.m. Monday - Friday. Please note that voicemails left after 4:00 p.m. may not be returned until the following business day.  We are closed weekends and major holidays. You have access to a nurse at all times for urgent questions. Please call the main number to the clinic [phone] and follow the prompts.  For any non-urgent questions, you may also contact your provider using MyChart. We now offer e-Visits for anyone 60 and older to request care online for non-urgent symptoms. For details visit [email or web address].   Also download the MyChart app! Go to the app store, search "MyChart", open the app, select [place], and log in with your MyChart username and password.

## 2023-05-29 NOTE — Progress Notes (Signed)
Patient tolerated iron infusion with no complaints voiced.  Peripheral IV site clean and dry with good blood return noted before and after infusion.  Band aid applied.  VSS with discharge and left in satisfactory condition with no s/s of distress noted.   

## 2023-07-30 ENCOUNTER — Encounter: Payer: Self-pay | Admitting: Nurse Practitioner

## 2023-07-30 ENCOUNTER — Other Ambulatory Visit: Payer: Self-pay | Admitting: Nurse Practitioner

## 2023-07-30 ENCOUNTER — Ambulatory Visit: Payer: BC Managed Care – PPO | Attending: Nurse Practitioner | Admitting: Nurse Practitioner

## 2023-07-30 ENCOUNTER — Ambulatory Visit: Payer: BC Managed Care – PPO | Attending: Nurse Practitioner

## 2023-07-30 ENCOUNTER — Telehealth: Payer: Self-pay | Admitting: Cardiology

## 2023-07-30 VITALS — BP 130/90 | HR 98 | Ht 67.0 in | Wt 283.0 lb

## 2023-07-30 DIAGNOSIS — R232 Flushing: Secondary | ICD-10-CM | POA: Diagnosis not present

## 2023-07-30 DIAGNOSIS — R251 Tremor, unspecified: Secondary | ICD-10-CM | POA: Diagnosis not present

## 2023-07-30 DIAGNOSIS — D509 Iron deficiency anemia, unspecified: Secondary | ICD-10-CM

## 2023-07-30 DIAGNOSIS — R002 Palpitations: Secondary | ICD-10-CM

## 2023-07-30 MED ORDER — METOPROLOL TARTRATE 25 MG PO TABS: 25.0000 mg | ORAL_TABLET | Freq: Two times a day (BID) | ORAL | 1 refills | Status: DC

## 2023-07-30 NOTE — Telephone Encounter (Signed)
Checking percert on the following   LONG TERM MONITOR (3-14 DAYS

## 2023-07-30 NOTE — Progress Notes (Unsigned)
Office Visit    Patient Name: Maureen Yates Date of Encounter: 07/30/2023 PCP:  Maureen Alcide, MD [facility]  Cardiologist:  Maureen Rich, MD  Advanced Practice Provider:  No care team member to display Electrophysiologist:  None   Chief Complaint and HPI    Maureen Yates is a 43 y.o. female with a hx of palpitations/chest pain, IDA, Hashimoto's thyroiditis, early onset polymyalgia rheumatica, IBS, GERD, and morbid obesity, who presents today for scheduled follow-up.   Visited [facility] ED in November 2023 with chest pain and palpitations.  Echocardiogram revealed EF normal, no significant abnormalities.  Troponins were negative.  EKG revealed sinus rhythm with nonspecific ST/T wave changes.  Chest x-ray negative.  Past monitor 06/2022 revealed rare ectopy with isolated episode of SVT only lasting 4 beats.  Last seen by Dr. Dina Yates on July 18, 2022.  She noted ongoing symptoms of palpitations.  Started on Lopressor 12.5 mg twice daily.  Was encouraged to continue to wean caffeine consumption.  12/04/2022 - Today she presents for 73-month follow-up.  She states she is doing well.  Does have rare episodes of palpitations, brief in duration.  Much improved from last office visit per her report.  Denies any acute concerns or complaints for today's visit. Denies any chest pain, shortness of breath, syncope, presyncope, dizziness, orthopnea, PND, swelling or significant weight changes, acute bleeding, or claudication.  07/30/2023 - Presents today for follow-up. Says she has been having ongoing episodes of palpitations since December 1st, 2024. Says when she has these episodes, she also experiences tremors and flushing of face. Shows me a picture and video on her phone to verify this. Says she has had tremors in the past and had a brain MRI performed that did not reveal anything significant to explain her tremors. Says her heart rate does not  necessarily feel fast with her palpitations but she can sense when it occurs. 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 Says her VS have been stable and her blood sugars are well controlled. Denies any chest pain, shortness of breath, syncope, presyncope, dizziness, orthopnea, PND, swelling or significant weight changes, acute bleeding, or claudication.  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  LVEF 60 to 65% 2.  Normal right ventricular size and normal systolic function.  06/2022 monitor: Sinus rhythm with rare ectopic beats. Patient events corresponded to sinus rhythm, mostly with isolated PVCs. No sustained arrhythmia or prolonged pauses noted.   Review of Systems    All other systems reviewed and are otherwise negative except as noted above.  Physical Exam    VS:  BP (!) 130/90   Pulse 98   Ht 5\' 7"  (1.702 m)   Wt 283 lb (128.4 kg)   SpO2 99%   BMI 44.32 kg/m  ,  BMI Body mass index is 44.32 kg/m.  Wt Readings from Last 3 Encounters:  07/30/23 283 lb (128.4 kg)  05/23/23 287 lb 6.4 oz (130.4 kg)  12/04/22 279 lb 9.6 oz (126.8 kg)     GEN: Morbidly obese, 43 year old female in no acute distress. HEENT: normal. Neck: Supple, no JVD, carotid bruits, or masses. Cardiac: S1/S2, RRR, no murmurs, rubs, or  gallops. No clubbing, cyanosis, edema.  Radials/PT 2+ and equal bilaterally.  Respiratory:  Respirations regular and unlabored, clear to auscultation bilaterally. GI: Soft, nontender, nondistended. MS: No deformity or atrophy. Skin: Warm and dry, no rash. Neuro:  Strength and sensation are intact. Psych: Normal affect.  Assessment & Plan    Palpitations, tremors, flushing of face Does note episodes of palpitations, associated with tremors and flushing of face, unclear etiology. Will arrange 3 day Zio XT monitor as she describes this as a daily occurrence, says she cannot tolerate wearing monitor for over 1 week in the past, says she is okay to wear this for 3 days. Will increase metoprolol tartrate to 25 mg BID. Will also obtain thyroid panel, CMET, Mag, and CBC for further evaluation.  Heart healthy diet and regular cardiovascular exercise encouraged. Care and ED precautions discussed. Continue to follow with PCP.  2. IDA Denies any issues.  Recent labwork in November 2024 was WNL. This is being managed by Dr. Ellin Saba.  Continue follow-up with Hem/Onc.  3. Morbid obesity Weight loss via diet and exercise encouraged. Discussed the impact being overweight would have on cardiovascular risk.  Continue to follow-up with PCP.     Disposition: Follow up in 6-8 weeks with Maureen Rich, MD or [facility].  Signed, Maureen Dory, NP

## 2023-07-30 NOTE — Patient Instructions (Addendum)
Medication Instructions:  Your physician has recommended you make the following change in your medication:  Please Increase Metoprolol Tartrate to 25 mg Twice daily   Labwork: Today   Testing/Procedures: Your physician has recommended that you wear a Zio monitor.   This monitor is a medical device that records the heart's electrical activity. Doctors most often use these monitors to diagnose arrhythmias. Arrhythmias are problems with the speed or rhythm of the heartbeat. The monitor is a small device applied to your chest. You can wear one while you do your normal daily activities. While wearing this monitor if you have any symptoms to push the button and record what you felt. Once you have worn this monitor for the period of time provider prescribed (for 3 days), you will return the monitor device in the postage paid box. Once it is returned they will download the data collected and provide [place] with a report which the provider will then review and we will call you with those results. Important tips:  Avoid showering during the first 24 hours of wearing the monitor. Avoid excessive sweating to help maximize wear time. Do not submerge the device, no hot tubs, and no swimming pools. Keep any lotions or oils away from the patch. After 24 hours you may shower with the patch on. Take brief showers with your back facing the shower head.  Do not remove patch once it has been placed because that will interrupt data and decrease adhesive wear time. Push the button when you have any symptoms and write down what you were feeling. Once you have completed wearing your monitor, remove and place into box which has postage paid and place in your outgoing mailbox.  If for some reason you have misplaced your box then call our office and we can provide another box and/or mail it off for you.  Follow-Up: Your physician recommends that you schedule a follow-up appointment in: 6-8 weeks   Any Other Special  Instructions Will Be Listed Below (If Applicable).  If you need a refill on your cardiac medications before your next appointment, please call your pharmacy.

## 2023-07-31 LAB — COMPREHENSIVE METABOLIC PANEL
ALT: 25 [IU]/L (ref 0–32)
AST: 25 [IU]/L (ref 0–40)
Albumin: 4.6 g/dL (ref 3.9–4.9)
Alkaline Phosphatase: 97 [IU]/L (ref 44–121)
BUN/Creatinine Ratio: 11 (ref 9–23)
BUN: 8 mg/dL (ref 6–24)
Bilirubin Total: 0.4 mg/dL (ref 0.0–1.2)
CO2: 22 mmol/L (ref 20–29)
Calcium: 9.6 mg/dL (ref 8.7–10.2)
Chloride: 102 mmol/L (ref 96–106)
Creatinine, Ser: 0.72 mg/dL (ref 0.57–1.00)
Globulin, Total: 2.7 g/dL (ref 1.5–4.5)
Glucose: 91 mg/dL (ref 70–99)
Potassium: 4.8 mmol/L (ref 3.5–5.2)
Sodium: 139 mmol/L (ref 134–144)
Total Protein: 7.3 g/dL (ref 6.0–8.5)
eGFR: 108 mL/min/{1.73_m2} (ref >59)

## 2023-07-31 LAB — CBC
Hematocrit: 41.2 % (ref 34.0–46.6)
Hemoglobin: 13.2 g/dL (ref 11.1–15.9)
MCH: 29.2 pg (ref 26.6–33.0)
MCHC: 32 g/dL (ref 31.5–35.7)
MCV: 91 fL (ref 79–97)
Platelets: 379 10*3/uL (ref 150–450)
RBC: 4.52 x10E6/uL (ref 3.77–5.28)
RDW: 14.2 % (ref 11.7–15.4)
WBC: 11.5 10*3/uL — ABNORMAL HIGH (ref 3.4–10.8)

## 2023-07-31 LAB — THYROID PANEL WITH TSH
Free Thyroxine Index: 2.7 (ref 1.2–4.9)
T3 Uptake Ratio: 18 % — ABNORMAL LOW (ref 24–39)
T4, Total: 14.9 ug/dL — ABNORMAL HIGH (ref 4.5–12.0)
TSH: 1.11 u[IU]/mL (ref 0.450–4.500)

## 2023-07-31 LAB — MAGNESIUM: Magnesium: 2.3 mg/dL (ref 1.6–2.3)

## 2023-08-08 DIAGNOSIS — R002 Palpitations: Secondary | ICD-10-CM | POA: Diagnosis not present

## 2023-09-10 ENCOUNTER — Ambulatory Visit: Payer: BC Managed Care – PPO | Attending: Nurse Practitioner | Admitting: Nurse Practitioner

## 2023-09-10 ENCOUNTER — Encounter: Payer: Self-pay | Admitting: Nurse Practitioner

## 2023-09-10 VITALS — BP 128/78 | HR 83 | Ht 67.0 in | Wt 286.0 lb

## 2023-09-10 DIAGNOSIS — R002 Palpitations: Secondary | ICD-10-CM | POA: Diagnosis not present

## 2023-09-10 DIAGNOSIS — D509 Iron deficiency anemia, unspecified: Secondary | ICD-10-CM | POA: Diagnosis not present

## 2023-09-10 DIAGNOSIS — R251 Tremor, unspecified: Secondary | ICD-10-CM | POA: Diagnosis not present

## 2023-09-10 DIAGNOSIS — R232 Flushing: Secondary | ICD-10-CM

## 2023-09-10 MED ORDER — PROPRANOLOL HCL 20 MG PO TABS: 20.0000 mg | ORAL_TABLET | Freq: Two times a day (BID) | ORAL | 1 refills | Status: DC

## 2023-09-10 NOTE — Progress Notes (Signed)
 Office Visit    Patient Name: Maureen Yates Date of Encounter: 09/10/2023 PCP:  Juliette Alcide, MD [facility]  Cardiologist:  Dina Rich, MD  Advanced Practice Provider:  No care team member to display Electrophysiologist:  None   Chief Complaint and HPI    Maureen Yates is a 43 y.o. female with a hx of palpitations/chest pain, IDA, Hashimoto's thyroiditis, early onset polymyalgia rheumatica, IBS, GERD, and morbid obesity, who presents today for scheduled follow-up.   Visited [facility] ED in November 2023 with chest pain and palpitations.  Echocardiogram revealed EF normal, no significant abnormalities.  Troponins were negative.  EKG revealed sinus rhythm with nonspecific ST/T wave changes.  Chest x-ray negative.  Past monitor 06/2022 revealed rare ectopy with isolated episode of SVT only lasting 4 beats.  Last seen by Dr. Dina Rich on July 18, 2022.  She noted ongoing symptoms of palpitations.  Started on Lopressor 12.5 mg twice daily.  Was encouraged to continue to wean caffeine consumption.  12/04/2022 - Today she presents for 66-month follow-up.  She states she is doing well.  Does have rare episodes of palpitations, brief in duration.  Much improved from last office visit per her report.  Denies any acute concerns or complaints for today's visit. Denies any chest pain, shortness of breath, syncope, presyncope, dizziness, orthopnea, PND, swelling or significant weight changes, acute bleeding, or claudication.  07/30/2023 - Presents today for follow-up. Says she has been having ongoing episodes of palpitations since December 1st, 2024. Says when she has these episodes, she also experiences tremors and flushing of face. Shows me a picture and video on her phone to verify this. Says she has had tremors in the past and had a brain MRI performed that did not reveal anything significant to explain her tremors. Says her heart rate does not  necessarily feel fast with her palpitations but she can sense when it occurs. 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 Says her VS have been stable and her blood sugars are well controlled. Denies any chest pain, shortness of breath, syncope, presyncope, dizziness, orthopnea, PND, swelling or significant weight changes, acute bleeding, or claudication.  09/10/2023 -she presents today for follow-up.  She says she is having similar symptoms compared to last office visit, says really overall there has been no change.  She says new dose of metoprolol did help with her anxiety some.  She continues to note tremors and believes her symptoms are partially due to internal anxiety, previously evaluated by another provider who mentioned this to her. Denies any chest pain, shortness of breath, syncope, presyncope, dizziness, orthopnea, PND, swelling or significant weight changes, acute bleeding, or claudication.  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   EKGs/Labs/Other Studies Reviewed:   The following studies were reviewed today:   EKG: EKG is not ordered today.  Preliminary monitor report 08/08/2023: Patch Wear Time:  4 days and 8 hours (2025-01-21T14:37:30-0500 to 2025-01-25T22:49:39-0500)   Patient had a min HR of 57 bpm, max HR of 152 bpm, and avg HR of 89 bpm. Predominant underlying rhythm was Sinus Rhythm. Isolated SVEs were rare (<1.0%), and no SVE Couplets or SVE Triplets were present. Isolated VEs were occasional (1.2%, 4949), VE  Couplets were rare (<1.0%, 10), and  no VE Triplets were present. Ventricular Bigeminy and Trigeminy were present.   2D echocardiogram 06/2022 [facility]): 1.  LVEF 60 to 65% 2.  Normal right ventricular size and normal systolic function.  06/2022 monitor: Sinus rhythm with rare  ectopic beats. Patient events corresponded to sinus rhythm, mostly with isolated PVCs. No sustained arrhythmia or prolonged pauses noted.   Review of Systems    All other systems reviewed and are otherwise negative except as noted above.  Physical Exam    VS:  BP 128/78   Pulse 83   Ht 5\' 7"  (1.702 m)   Wt 286 lb (129.7 kg)   SpO2 98%   BMI 44.79 kg/m  , BMI Body mass index is 44.79 kg/m.  Wt Readings from Last 3 Encounters:  09/10/23 286 lb (129.7 kg)  07/30/23 283 lb (128.4 kg)  05/23/23 287 lb 6.4 oz (130.4 kg)     GEN: Morbidly obese, 43 year old female in no acute distress. HEENT: normal. Neck: Supple, no JVD, carotid bruits, or masses. Cardiac: S1/S2, RRR, no murmurs, rubs, or gallops. No clubbing, cyanosis, edema.  Radials/PT 2+ and equal bilaterally.  Respiratory:  Respirations regular and unlabored, clear to auscultation bilaterally. GI: Soft, nontender, nondistended. MS: No deformity or atrophy. Skin: Warm and dry, no rash. Neuro:  Strength and sensation are intact. Psych: Normal affect.  Assessment & Plan    Palpitations, tremors, flushing of face Continues to note similar symptoms from last office visit.  Some mild improvement, but hard for her to tell.  See previous monitor report noted above. Will stop metoprolol and start propranolol 20 mg twice daily to help with her tremors and anxiety.  She will update [place] in 1 to 2 weeks how she is doing on this new medication.  No other medication changes at this time. Heart healthy diet and regular cardiovascular exercise encouraged. Care and ED precautions discussed. Continue to follow with PCP.  2. IDA Denies any issues.  Recent labwork in November 2024 was WNL. This is being managed by Dr. Ellin Saba.  Continue follow-up with Hem/Onc.  3. Morbid obesity Weight loss via diet and exercise encouraged. Discussed the impact being overweight would have on cardiovascular risk.  Continue to follow-up with PCP.     Disposition: Follow up in 3 months with Dina Rich, MD or APP or sooner if anything changes.  Signed, Sharlene Dory, NP

## 2023-09-10 NOTE — Patient Instructions (Addendum)
 Medication Instructions:  Your physician has recommended you make the following change in your medication:  Please stop metoprolol  Please start Propranolol 20 Mg Twice daily   Labwork: None   Testing/Procedures: None   Follow-Up: Your physician recommends that you schedule a follow-up appointment in: 3 months   Any Other Special Instructions Will Be Listed Below (If Applicable).  If you need a refill on your cardiac medications before your next appointment, please call your pharmacy.

## 2023-10-07 DIAGNOSIS — E063 Autoimmune thyroiditis: Secondary | ICD-10-CM | POA: Diagnosis not present

## 2023-10-11 ENCOUNTER — Other Ambulatory Visit (HOSPITAL_COMMUNITY): Payer: Self-pay | Admitting: Internal Medicine

## 2023-10-11 DIAGNOSIS — E042 Nontoxic multinodular goiter: Secondary | ICD-10-CM | POA: Diagnosis not present

## 2023-10-11 DIAGNOSIS — E039 Hypothyroidism, unspecified: Secondary | ICD-10-CM | POA: Diagnosis not present

## 2023-10-11 DIAGNOSIS — E669 Obesity, unspecified: Secondary | ICD-10-CM | POA: Diagnosis not present

## 2023-10-11 DIAGNOSIS — E063 Autoimmune thyroiditis: Secondary | ICD-10-CM | POA: Diagnosis not present

## 2023-10-18 ENCOUNTER — Ambulatory Visit (HOSPITAL_COMMUNITY)
Admission: RE | Admit: 2023-10-18 | Discharge: 2023-10-18 | Disposition: A | Discharge status: Home / Self Care | Source: Ambulatory Visit | Attending: Internal Medicine | Admitting: Internal Medicine

## 2023-10-18 DIAGNOSIS — E042 Nontoxic multinodular goiter: Secondary | ICD-10-CM | POA: Insufficient documentation

## 2023-10-22 ENCOUNTER — Ambulatory Visit (HOSPITAL_COMMUNITY)

## 2023-11-18 ENCOUNTER — Inpatient Hospital Stay: Payer: BC Managed Care – PPO | Attending: Hematology

## 2023-11-18 DIAGNOSIS — D509 Iron deficiency anemia, unspecified: Secondary | ICD-10-CM | POA: Insufficient documentation

## 2023-11-18 DIAGNOSIS — D72829 Elevated white blood cell count, unspecified: Secondary | ICD-10-CM

## 2023-11-18 DIAGNOSIS — D500 Iron deficiency anemia secondary to blood loss (chronic): Secondary | ICD-10-CM

## 2023-11-18 LAB — CBC WITH DIFFERENTIAL/PLATELET
Abs Immature Granulocytes: 0.03 10*3/uL (ref 0.00–0.07)
Basophils Absolute: 0.1 10*3/uL (ref 0.0–0.1)
Basophils Relative: 1 %
Eosinophils Absolute: 0.3 10*3/uL (ref 0.0–0.5)
Eosinophils Relative: 3 %
HCT: 39 % (ref 36.0–46.0)
Hemoglobin: 12.4 g/dL (ref 12.0–15.0)
Immature Granulocytes: 0 %
Lymphocytes Relative: 21 %
Lymphs Abs: 1.9 10*3/uL (ref 0.7–4.0)
MCH: 29.5 pg (ref 26.0–34.0)
MCHC: 31.8 g/dL (ref 30.0–36.0)
MCV: 92.6 fL (ref 80.0–100.0)
Monocytes Absolute: 0.4 10*3/uL (ref 0.1–1.0)
Monocytes Relative: 5 %
Neutro Abs: 6.6 10*3/uL (ref 1.7–7.7)
Neutrophils Relative %: 70 %
Platelets: 357 10*3/uL (ref 150–400)
RBC: 4.21 MIL/uL (ref 3.87–5.11)
RDW: 13.7 % (ref 11.5–15.5)
WBC: 9.2 10*3/uL (ref 4.0–10.5)
nRBC: 0 % (ref 0.0–0.2)

## 2023-11-18 LAB — IRON AND TIBC
Iron: 83 ug/dL (ref 28–170)
Saturation Ratios: 25 % (ref 10.4–31.8)
TIBC: 333 ug/dL (ref 250–450)
UIBC: 250 ug/dL

## 2023-11-18 LAB — FERRITIN: Ferritin: 321 ng/mL — ABNORMAL HIGH (ref 11–307)

## 2023-11-18 NOTE — Progress Notes (Incomplete)
 [facility] [address] [place] [address]    Clinic Day:  11/18/2023  Referring physician: Alston Jerry, MD  Patient Care Team: Alston Jerry, MD as PCP - General (Family Medicine) Amanda Jungling Joyceann No, MD as PCP - Cardiology (Cardiology) Rehman, Mathews Solomons, MD (Inactive) as Consulting Physician (Gastroenterology)   ASSESSMENT & PLAN:   Assessment: 1.  Iron  deficiency anemia: - Patient reports she does have occasional bleeding per rectum. 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 -Last Feraheme  on 03/13/2019 on 03/23/2019.   2.  Severe fatigue: -She was evaluated by rheumatology and was diagnosed with polymyalgia rheumatica and Sjogren's syndrome.    Plan: 1.  Iron  deficiency anemia: - She cannot tolerate oral iron  therapy. - Ferritin is 174, down from 244.  Hemoglobin is stable at 12.2.  She reports decreased energy levels. - She also reports itching generalized for the last 2 months with no new rash.  She has eczematous rash mostly on the trunk for many years.  She will contact eczema specialist at a tertiary clinic. - I have recommended Feraheme  510 mg x 1. - RTC 6 months for follow-up with repeat labs.   2.  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  CRP is chronically elevated. - White count today is 13.2, with ANC of 10.2.   3.  Vitamin D  deficiency: - Continue weekly vitamin D .  Level is 39.    No orders of the defined types were placed in this encounter.     Maureen Yates,acting as a Neurosurgeon for Paulett Boros, MD.,have documented all relevant documentation on the behalf of Paulett Boros, MD,as directed by  Paulett Boros, MD while in the presence of Paulett Boros,  MD.  ***    Maureen Yates   5/12/20254:29 PM  CHIEF COMPLAINT:   Diagnosis: IDA    Cancer Staging  No matching staging information was found for the patient.    Prior Therapy: none  Current Therapy:  Intermittent Venofer , last on 05/11/22    HISTORY OF PRESENT ILLNESS:   Oncology History   No history exists.     INTERVAL HISTORY:   Maureen Yates is a 43 y.o. female presenting to clinic today for follow up of IDA. She was last seen by me on 05/23/23.  Today, she states that she is doing well overall. Her appetite level is at ***%. Her energy level is at ***%.  PAST MEDICAL HISTORY:   Past Medical History: Past Medical History:  Diagnosis Date   Anxiety    Environmental allergies    GERD (gastroesophageal reflux disease)    Hashimoto's thyroiditis    IBS (irritable bowel syndrome)    Myofascial pain    Tremor    right arm   Tubal ectopic pregnancy     Surgical History: Past Surgical History:  Procedure Laterality Date   CHOLECYSTECTOMY  Sept of 2012 gallstones   COLONOSCOPY  02/27/2012   Procedure: COLONOSCOPY;  Surgeon: Ruby Corporal, MD;  Location: [facility];  Service: Endoscopy;  Laterality: N/A;  225   eptopic pregnancy  2015   rt ovary      removed 03/2010 for 4 pound tumor   TUBAL LIGATION Bilateral 04/2013    Social History: Social History  Socioeconomic History   Marital status: Married    Spouse name: Maureen Yates   Number of children: 2   Years of education: Not on file   Highest education level: Not on file  Occupational History   Not on file  Tobacco Use   Smoking status: Never   Smokeless tobacco: Never  Vaping Use   Vaping status: Never Used  Substance and Sexual Activity   Alcohol use: No   Drug use: No   Sexual activity: Yes  Other Topics Concern   Not on file  Social History Narrative   Lives at home with husband and children   Right handed   Caffeine: avg of 16 oz soda daily   Social Drivers of Research scientist (physical sciences) Strain: Not on file  Food Insecurity: Not on file  Transportation Needs: Not on file  Physical Activity: Not on file  Stress: Not on file  Social Connections: Not on file  Intimate Partner Violence: Not At Risk (08/30/2022)   Received from [facility], [facility]   Humiliation, Afraid, Rape, and Kick questionnaire    Fear of Current or Ex-Partner: No    Emotionally Abused: No    Physically Abused: No    Sexually Abused: No    Family History: Family History  Problem Relation Age of Onset   Epilepsy Mother    Fibromyalgia Mother    Epilepsy Brother    Lupus Brother    Crohn's disease Father    COPD Father    Graves' disease Maternal Grandmother    Healthy Son    Allergy (severe) Son    Healthy Daughter    Parkinson's disease Maternal Great-grandfather     Current Medications:  Current Outpatient Medications:    albuterol (VENTOLIN HFA) 108 (90 Base) MCG/ACT inhaler, Inhale 2 puffs into the lungs every 6 (six) hours as needed., Disp: , Rfl:    ALPRAZolam (XANAX) 0.5 MG tablet, Take 0.25-0.5 mg by mouth as needed (start of tremor)., Disp: , Rfl:    fluconazole (DIFLUCAN) 150 MG tablet, Take 150 mg by mouth every 30 (thirty) days., Disp: , Rfl:    FLUoxetine (PROZAC) 10 MG capsule, Take 10 mg by mouth daily., Disp: , Rfl:    FLUoxetine (PROZAC) 20 MG capsule, Take 20 mg by mouth daily., Disp: , Rfl:    ibuprofen (ADVIL) 800 MG tablet, Take 800 mg by mouth 3 (three) times daily., Disp: , Rfl:    ketoconazole (NIZORAL) 2 % shampoo, USE AS BODY WASH AS DIRECTED, Disp: , Rfl:    lamoTRIgine  (LAMICTAL ) 100 MG tablet, Take 100 mg by mouth daily., Disp: , Rfl:    levothyroxine  (SYNTHROID ) 75 MCG tablet, Take 75 mcg by mouth every morning., Disp: , Rfl:    mupirocin ointment (BACTROBAN) 2 %, APPLY TO AFFECTED AREA TWICE A DAY, Disp: , Rfl:    propranolol  (INDERAL ) 20 MG tablet, Take 1 tablet (20 mg total) by mouth 2 (two) times daily., Disp: 180 tablet, Rfl: 1    SUMAtriptan (IMITREX) 50 MG tablet, SMARTSIG:1 Tablet(s) By Mouth 1-2 Times Daily, Disp: , Rfl:    Vitamin D , Ergocalciferol , (DRISDOL ) 1.25 MG (50000 UNIT) CAPS capsule, TAKE 1 CAPSULE BY MOUTH ONE TIME PER WEEK, Disp: 12 capsule, Rfl: 4   Allergies: Allergies  Allergen Reactions   Penicillins Rash   Casein Other (See Comments)    G.I. Upset   Augmentin [Amoxicillin-Pot Clavulanate] Swelling and Rash    REVIEW OF SYSTEMS:   Review  of Systems  Constitutional:  Negative for chills, fatigue and fever.  HENT:   Negative for lump/mass, mouth sores, nosebleeds, sore throat and trouble swallowing.   Eyes:  Negative for eye problems.  Respiratory:  Negative for cough and shortness of breath.   Cardiovascular:  Negative for chest pain, leg swelling and palpitations.  Gastrointestinal:  Negative for abdominal pain, constipation, diarrhea, nausea and vomiting.  Genitourinary:  Negative for bladder incontinence, difficulty urinating, dysuria, frequency, hematuria and nocturia.   Musculoskeletal:  Negative for arthralgias, back pain, flank pain, myalgias and neck pain.  Skin:  Negative for itching and rash.  Neurological:  Negative for dizziness, headaches and numbness.  Hematological:  Does not bruise/bleed easily.  Psychiatric/Behavioral:  Negative for depression, sleep disturbance and suicidal ideas. The patient is not nervous/anxious.   All other systems reviewed and are negative.    VITALS:   There were no vitals taken for this visit.  Wt Readings from Last 3 Encounters:  09/10/23 286 lb (129.7 kg)  07/30/23 283 lb (128.4 kg)  05/23/23 287 lb 6.4 oz (130.4 kg)    There is no height or weight on file to calculate BMI.  Performance status (ECOG): 1 - Symptomatic but completely ambulatory  PHYSICAL EXAM:   Physical Exam Vitals and nursing note reviewed. Exam conducted with a chaperone present.  Constitutional:      Appearance: Normal appearance.  Cardiovascular:     Rate and  Rhythm: Normal rate and regular rhythm.     Pulses: Normal pulses.     Heart sounds: Normal heart sounds.  Pulmonary:     Effort: Pulmonary effort is normal.     Breath sounds: Normal breath sounds.  Abdominal:     Palpations: Abdomen is soft. There is no hepatomegaly, splenomegaly or mass.     Tenderness: There is no abdominal tenderness.  Musculoskeletal:     Right lower leg: No edema.     Left lower leg: No edema.  Lymphadenopathy:     Cervical: No cervical adenopathy.     Right cervical: No superficial, deep or posterior cervical adenopathy.    Left cervical: No superficial, deep or posterior cervical adenopathy.     Upper Body:     Right upper body: No supraclavicular or axillary adenopathy.     Left upper body: No supraclavicular or axillary adenopathy.  Neurological:     General: No focal deficit present.     Mental Status: She is alert and oriented to person, place, and time.  Psychiatric:        Mood and Affect: Mood normal.        Behavior: Behavior normal.     LABS:      Latest Ref Rng & Units 11/18/2023   10:20 AM 07/30/2023    3:35 PM 05/16/2023    9:58 AM  CBC  WBC 4.0 - 10.5 K/uL 9.2  11.5  13.2   Hemoglobin 12.0 - 15.0 g/dL 16.1  09.6  04.5   Hematocrit 36.0 - 46.0 % 39.0  41.2  38.8   Platelets 150 - 400 K/uL 357  379  373       Latest Ref Rng & Units 07/30/2023    3:36 PM 05/25/2022    4:38 PM 05/25/2022    4:09 PM  CMP  Glucose 70 - 99 mg/dL 91   94   BUN 6 - 24 mg/dL 8   7   Creatinine 4.09 - 1.00 mg/dL 8.11   9.14   Sodium  134 - 144 mmol/L 139   136   Potassium 3.5 - 5.2 mmol/L 4.8   3.5   Chloride 96 - 106 mmol/L 102   100   CO2 20 - 29 mmol/L 22   27   Calcium 8.7 - 10.2 mg/dL 9.6   8.7   Total Protein 6.0 - 8.5 g/dL 7.3  7.4    Total Bilirubin 0.0 - 1.2 mg/dL 0.4  0.4    Alkaline Phos 44 - 121 IU/L 97  78    AST 0 - 40 IU/L 25  19    ALT 0 - 32 IU/L 25  19       No results found for: "CEA1", "CEA" / No results found for: "CEA1",  "CEA" No results found for: "PSA1" No results found for: "ZOX096" No results found for: "CAN125"  No results found for: "TOTALPROTELP", "ALBUMINELP", "A1GS", "A2GS", "BETS", "BETA2SER", "GAMS", "MSPIKE", "SPEI" Lab Results  Component Value Date   TIBC 333 11/18/2023   TIBC 346 05/16/2023   TIBC 353 10/18/2022   FERRITIN 321 (H) 11/18/2023   FERRITIN 174 05/16/2023   FERRITIN 244 10/18/2022   IRONPCTSAT 25 11/18/2023   IRONPCTSAT 15 05/16/2023   IRONPCTSAT 15 10/18/2022   Lab Results  Component Value Date   LDH 169 09/22/2020   LDH 207 (H) 04/13/2020   LDH 185 12/08/2019     STUDIES:   No results found.

## 2023-11-19 ENCOUNTER — Inpatient Hospital Stay (HOSPITAL_BASED_OUTPATIENT_CLINIC_OR_DEPARTMENT_OTHER): Admitting: Hematology

## 2023-11-19 ENCOUNTER — Inpatient Hospital Stay: Payer: BC Managed Care – PPO | Admitting: Hematology

## 2023-11-19 DIAGNOSIS — D509 Iron deficiency anemia, unspecified: Secondary | ICD-10-CM | POA: Diagnosis not present

## 2023-11-19 DIAGNOSIS — D72829 Elevated white blood cell count, unspecified: Secondary | ICD-10-CM

## 2023-11-19 NOTE — Progress Notes (Signed)
 Virtual Visit via Telephone Note  I connected with Kaitelyn Shorts on 11/19/23 at 11:30 AM EDT by telephone and verified that I am speaking with the correct person using two identifiers.  Location: Patient: At home Provider: At home   I discussed the limitations, risks, security and privacy concerns of performing an evaluation and management service by telephone and the availability of in person appointments. I also discussed with the patient that there may be a patient responsible charge related to this service. The patient expressed understanding and agreed to proceed.   History of Present Illness: She is seeing in our office for iron  deficiency anemia from blood loss.  Her last Feraheme  infusion was on 05/29/2023.  She cannot tolerate oral iron  therapy.   Observations/Objective: She reports energy levels and appetite levels at 50%.  She reports some trouble sleeping due to night sweats.  She reports aches and pains from polymyalgia rheumatica.  She has been off of steroids.  Denies any BRBPR/melena.  Assessment and Plan:  1.  Iron  deficiency anemia: - We reviewed her labs from 11/10/2023.  Ferritin is 321 and saturation is 25.  Hemoglobin is normal at 12.4. - No indication for parenteral iron  therapy at this time.  RTC 6 months with repeat ferritin, iron  panel and CBC.  2.  JAK2 V6 44F and BCR/ABL negative reactive leukocytosis: - Latest CBC shows normal white count with normal differential.  This is likely reactive from connective tissue disorder.  No further workup needed.   Follow Up Instructions:  RTC 6 months with CBC, ferritin and iron  panel  I discussed the assessment and treatment plan with the patient. The patient was provided an opportunity to ask questions and all were answered. The patient agreed with the plan and demonstrated an understanding of the instructions.   The patient was advised to call back or seek an in-person evaluation if the symptoms worsen or if the  condition fails to improve as anticipated.  I provided 20 minutes of non-face-to-face time during this encounter.   Paulett Boros, MD

## 2023-11-26 ENCOUNTER — Ambulatory Visit: Admitting: Hematology

## 2023-12-11 ENCOUNTER — Ambulatory Visit: Admitting: Nurse Practitioner

## 2024-01-09 DIAGNOSIS — Z9079 Acquired absence of other genital organ(s): Secondary | ICD-10-CM | POA: Diagnosis not present

## 2024-01-09 DIAGNOSIS — Z8041 Family history of malignant neoplasm of ovary: Secondary | ICD-10-CM | POA: Diagnosis not present

## 2024-01-09 DIAGNOSIS — N921 Excessive and frequent menstruation with irregular cycle: Secondary | ICD-10-CM | POA: Diagnosis not present

## 2024-01-09 DIAGNOSIS — Z01419 Encounter for gynecological examination (general) (routine) without abnormal findings: Secondary | ICD-10-CM | POA: Diagnosis not present

## 2024-01-16 DIAGNOSIS — Z1231 Encounter for screening mammogram for malignant neoplasm of breast: Secondary | ICD-10-CM | POA: Diagnosis not present

## 2024-01-17 ENCOUNTER — Ambulatory Visit: Attending: Cardiology | Admitting: Cardiology

## 2024-01-17 ENCOUNTER — Encounter: Payer: Self-pay | Admitting: Cardiology

## 2024-01-17 VITALS — BP 130/86 | HR 83 | Ht 67.0 in | Wt 285.4 lb

## 2024-01-17 DIAGNOSIS — R002 Palpitations: Secondary | ICD-10-CM

## 2024-01-17 NOTE — Patient Instructions (Addendum)
Medication Instructions:  Continue all other medications.     Labwork: none  Testing/Procedures: none  Follow-Up: 6 months   Any Other Special Instructions Will Be Listed Below (If Applicable).   If you need a refill on your cardiac medications before your next appointment, please call your pharmacy.  

## 2024-01-17 NOTE — Progress Notes (Signed)
 Clinical Summary Maureen Yates is a 43 y.o.female seen today for follow up of the following medical problems.   1.Chest pain/palpitations - ER visit 05/2022 with chest pain and palpitations - trop neg, EKG SR nonspecific ST/T changes. CXR no acute process - 06/2022 echo [facility]: LVEF 60-65%, no significant abnormal findings    - had some skin breakdown with pcp zio patch - monitor with just rare ectopy, isolated episode of SVT 4 beats - has weaned caffeine. Had been drinking cokes x 2 cans, no coffee, no tea, no energy drinks. No EtOH.   -initially did well with lopressor  but progressing recurrent symptmos at f/u - 09/2023 NP Maureen Yates had changed to propranolol  20mg  bid give tremors, anxiety and palpitatons - symptoms much improved with medication change - she reports ongoing symptoms with anxiety.   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 Upset   Augmentin [Amoxicillin-Pot Clavulanate] Swelling and Rash     Current Outpatient Medications  Medication Sig Dispense Refill   albuterol (VENTOLIN HFA) 108 (90 Base) MCG/ACT inhaler Inhale 2 puffs into the lungs every 6 (six) hours as needed.     ALPRAZolam (XANAX) 0.5 MG tablet Take 0.25-0.5 mg by mouth as needed (start of tremor).     fluconazole (DIFLUCAN) 150 MG tablet Take 150 mg by mouth every 30 (thirty) days.     FLUoxetine (PROZAC) 10 MG capsule Take 10 mg by mouth daily.     FLUoxetine (PROZAC) 20 MG capsule Take 20 mg by mouth daily.     ibuprofen (ADVIL) 800 MG tablet Take 800 mg by mouth 3 (three)  times daily.     ketoconazole (NIZORAL) 2 % shampoo USE AS BODY WASH AS DIRECTED     lamoTRIgine  (LAMICTAL ) 100 MG tablet Take 100 mg by mouth daily.     levothyroxine  (SYNTHROID ) 75 MCG tablet Take 75 mcg by mouth every morning.     mupirocin ointment (BACTROBAN) 2 % APPLY TO AFFECTED AREA TWICE A DAY     propranolol  (INDERAL ) 20 MG tablet Take 1 tablet (20 mg total) by mouth 2 (two) times daily. 180 tablet 1   SUMAtriptan (IMITREX) 100 MG tablet 1 tablet at least 2 hours between doses as needed Orally Twice a day     Vitamin D , Ergocalciferol , (DRISDOL ) 1.25 MG (50000 UNIT) CAPS capsule TAKE 1 CAPSULE BY MOUTH ONE TIME PER WEEK 12 capsule 4   No current facility-administered medications for this visit.     Past Surgical History:  Procedure Laterality Date   CHOLECYSTECTOMY  Sept of 2012 gallstones   COLONOSCOPY  02/27/2012   Procedure: COLONOSCOPY;  Surgeon: Maureen RAYMOND Rivet, MD;  [facility];  Service: Endoscopy;  Laterality: N/A;  225   eptopic pregnancy  2015   rt ovary      removed 03/2010 for 4 pound tumor   TUBAL LIGATION Bilateral 04/2013     Allergies  Allergen Reactions   Penicillins Rash  Casein Other (See Comments)    G.I. Upset   Augmentin [Amoxicillin-Pot Clavulanate] Swelling and Rash      Family History  Problem Relation Age of Onset   Epilepsy Mother    Fibromyalgia Mother    Epilepsy Brother    Lupus Brother    Crohn's disease Father    COPD Father    Yvone' disease Maternal Grandmother    Healthy Son    Allergy (severe) Son    Healthy Daughter    Parkinson's disease Maternal Great-grandfather      Social History Maureen Yates reports that she has never smoked. She has never used smokeless tobacco. Maureen Yates reports no history of alcohol use.    Physical Examination Today's Vitals   01/17/24 1134  BP: 130/86  Pulse: 83  SpO2: 98%  Weight: 285 lb 6.4 oz (129.5 kg)  Height: 5' 7 (1.702 m)   Body mass index is 44.7  kg/m.  Gen: resting comfortably, no acute distress HEENT: no scleral icterus, pupils equal round and reactive, no palptable cervical adenopathy,  CV: RRR, no mrg, no jvd Resp: Clear to auscultation bilaterally GI: abdomen is soft, non-tender, non-distended, normal bowel sounds, no hepatosplenomegaly MSK: extremities are warm, no edema.  Skin: warm, no rash Neuro:  no focal deficits Psych: appropriate affect   Diagnostic Studies 06/2022 echo [facility] Summary  1. The left ventricle is normal in size with normal wall thickness.  2. The left ventricular systolic function is normal, LVEF is visually estimated at 60-65%.  3. The right ventricle is normal in size, with normal systolic function.    06/2022 monitor - Patient had a min HR of 60 bpm, max HR of 160 bpm, and avg HR of 88 bpm. Predominant underlying rhythm was Sinus Rhythm. 1 run of Supraventricular Tachycardia occurred lasting 4 beats with a max rate of 160 bpm (avg 157 bpm). Isolated SVEs were rare (<1.0%), SVE Triplets were rare (<1.0%), and no SVE Couplets were present. Isolated VEs were rare (<1.0%), VE Couplets were rare (<1.0%), and no VE Triplets were present. Ventricular Bigeminy was present.  Final Interpretation  Rhythm was sinus with rare ectopic beats. Patient events correspond to sinus rhythm, mostly with isolated PVCs. No sustained arrhythmia or prolonged pauses present. Electronically signed by Dr. Epimenio     Assessment and Plan  1.Palpitations - recent monitor with rare PACs, PVCs. Isolated run of SVT just 4 beats - symptoms improved on propranolol , looks to be significant anxiety component related to symtoms - continue propranolol   F/u 6 months      Dorn PHEBE Ross, M.D.

## 2024-03-07 ENCOUNTER — Other Ambulatory Visit: Payer: Self-pay | Admitting: Nurse Practitioner

## 2024-03-18 MED ORDER — PROPRANOLOL HCL 20 MG PO TABS: 20.0000 mg | ORAL_TABLET | Freq: Two times a day (BID) | ORAL | 1 refills | Status: AC

## 2024-04-10 ENCOUNTER — Other Ambulatory Visit: Payer: Self-pay

## 2024-04-10 DIAGNOSIS — D509 Iron deficiency anemia, unspecified: Secondary | ICD-10-CM

## 2024-04-10 DIAGNOSIS — D72829 Elevated white blood cell count, unspecified: Secondary | ICD-10-CM

## 2024-04-13 ENCOUNTER — Inpatient Hospital Stay: Attending: Oncology

## 2024-04-13 DIAGNOSIS — D509 Iron deficiency anemia, unspecified: Secondary | ICD-10-CM | POA: Diagnosis not present

## 2024-04-13 DIAGNOSIS — D72829 Elevated white blood cell count, unspecified: Secondary | ICD-10-CM | POA: Insufficient documentation

## 2024-04-13 LAB — CBC WITH DIFFERENTIAL/PLATELET
Abs Immature Granulocytes: 0.04 K/uL (ref 0.00–0.07)
Basophils Absolute: 0.1 K/uL (ref 0.0–0.1)
Basophils Relative: 1 %
Eosinophils Absolute: 0.3 K/uL (ref 0.0–0.5)
Eosinophils Relative: 3 %
HCT: 39.4 % (ref 36.0–46.0)
Hemoglobin: 12.5 g/dL (ref 12.0–15.0)
Immature Granulocytes: 0 %
Lymphocytes Relative: 20 %
Lymphs Abs: 2.4 K/uL (ref 0.7–4.0)
MCH: 29.3 pg (ref 26.0–34.0)
MCHC: 31.7 g/dL (ref 30.0–36.0)
MCV: 92.5 fL (ref 80.0–100.0)
Monocytes Absolute: 0.5 K/uL (ref 0.1–1.0)
Monocytes Relative: 4 %
Neutro Abs: 8.4 K/uL — ABNORMAL HIGH (ref 1.7–7.7)
Neutrophils Relative %: 72 %
Platelets: 388 K/uL (ref 150–400)
RBC: 4.26 MIL/uL (ref 3.87–5.11)
RDW: 14 % (ref 11.5–15.5)
WBC: 11.7 K/uL — ABNORMAL HIGH (ref 4.0–10.5)
nRBC: 0 % (ref 0.0–0.2)

## 2024-04-13 LAB — IRON AND TIBC
Iron: 55 ug/dL (ref 28–170)
Saturation Ratios: 16 % (ref 10.4–31.8)
TIBC: 346 ug/dL (ref 250–450)
UIBC: 291 ug/dL

## 2024-04-13 LAB — FERRITIN: Ferritin: 378 ng/mL — ABNORMAL HIGH (ref 11–307)

## 2024-04-15 DIAGNOSIS — D509 Iron deficiency anemia, unspecified: Secondary | ICD-10-CM | POA: Diagnosis not present

## 2024-04-15 DIAGNOSIS — Z1329 Encounter for screening for other suspected endocrine disorder: Secondary | ICD-10-CM | POA: Diagnosis not present

## 2024-04-15 DIAGNOSIS — Z6841 Body Mass Index (BMI) 40.0 and over, adult: Secondary | ICD-10-CM | POA: Diagnosis not present

## 2024-04-15 DIAGNOSIS — M3505 Sjogren syndrome with inflammatory arthritis: Secondary | ICD-10-CM | POA: Diagnosis not present

## 2024-04-15 DIAGNOSIS — M353 Polymyalgia rheumatica: Secondary | ICD-10-CM | POA: Diagnosis not present

## 2024-04-15 DIAGNOSIS — M7918 Myalgia, other site: Secondary | ICD-10-CM | POA: Diagnosis not present

## 2024-04-21 DIAGNOSIS — M353 Polymyalgia rheumatica: Secondary | ICD-10-CM | POA: Diagnosis not present

## 2024-04-21 DIAGNOSIS — K900 Celiac disease: Secondary | ICD-10-CM | POA: Diagnosis not present

## 2024-04-21 DIAGNOSIS — M7918 Myalgia, other site: Secondary | ICD-10-CM | POA: Diagnosis not present

## 2024-04-21 DIAGNOSIS — M1990 Unspecified osteoarthritis, unspecified site: Secondary | ICD-10-CM | POA: Diagnosis not present

## 2024-04-21 DIAGNOSIS — Z1329 Encounter for screening for other suspected endocrine disorder: Secondary | ICD-10-CM | POA: Diagnosis not present

## 2024-05-13 ENCOUNTER — Inpatient Hospital Stay

## 2024-05-19 NOTE — Progress Notes (Unsigned)
 [facility] [address], [place] [address]   CLINIC:  Medical Oncology/Hematology  PCP:  Lari Elspeth BRAVO, MD [address] [place] [address] [phone]   REASON FOR VISIT:  Follow-up for iron  deficiency anemia and reactive leukocytosis  CURRENT THERAPY: Intermittent IV iron   INTERVAL HISTORY:   Maureen Yates 43 y.o. female returns for routine follow-up of iron  deficiency anemia and reactive leukocytosis She was last seen by Dr. Rogers on 11/19/2023 via telemedicine visit.  She  denies any recent surgeries, hospitalizations, or changes in baseline health status.  *** At today's visit, she reports feeling ***.  She  reports ***% energy and ***% appetite.   ***She  is maintaining stable weight at this time.  ***Patient reports she does have occasional bleeding per rectum.  *** Menstrual cycles *** She continues to have severe fatigue related to her connective tissue disorder (polymyalgia rheumatica and Sjogren syndrome), continuing to follow with rheumatology.  *** ***Did IV iron  ever really help with fatigue in the past??  ***  ASSESSMENT & PLAN:  1.  Functional iron  deficiency, without anemia - Review of past labs shows normal ferritin >100.  She has had occasionally low iron  saturation, and has been given IV iron  to see if it will help with her chronic fatigue (primarily attributed to connective tissue disorder) - Unable to tolerate oral iron . - She has required intermittent IV iron .  Most recent IV iron  with Feraheme  x 1 on 05/29/2023, when labs at the time showed ferritin 174/iron  saturation 15%.  (Prior to that, she had not required IV iron  since September 2020.) - Patient reports she does have occasional bleeding per rectum.  *** Menstrual cycles *** - She has severe fatigue related to connective tissue disorder *** - Most recent labs (04/13/2024): Hgb 12.5/MCV 92.5.  Ferritin 378, iron  saturation 16%. - PLAN: No indication for IV iron  at this  time.  *** Recommend discharge to PCP for ongoing monitoring of CBC and iron  panel.  She can be referred back to us  on an as-needed basis in the future if she has ferritin <100 and would potentially benefit from IV iron  infusion.  ***  2.  Leukocytosis, reactive - Chronically elevated WBC since at least 2014.  CRP also chronically elevated. - Testing for JAK2 V617F with reflex to CALR, MPL, E12-15 was negative.  BCR/ABL negative. - Suspect reactive leukocytosis from connective tissue disorder (evaluated by rheumatology and diagnosed with polymyalgia rheumatica and Sjogren syndrome).   - PLAN: No further workup of reactive leukocytosis needed.  Can be followed by PCP.  ***  3.  Severe fatigue: - She was evaluated by rheumatology and was diagnosed with polymyalgia rheumatica and Sjogren's syndrome.  PLAN SUMMARY: >> *** >> *** >> ***    REVIEW OF SYSTEMS: ***  Review of Systems - Oncology   PHYSICAL EXAM:  ECOG PERFORMANCE STATUS: {CHL ONC ECOG ED:[id number]} *** There were no vitals filed for this visit. There were no vitals filed for this visit. Physical Exam  PAST MEDICAL/SURGICAL HISTORY:  Past Medical History:  Diagnosis Date   Anxiety    Environmental allergies    GERD (gastroesophageal reflux disease)    Hashimoto's thyroiditis    IBS (irritable bowel syndrome)    Myofascial pain    Tremor    right arm   Tubal ectopic pregnancy    Past Surgical History:  Procedure Laterality Date   CHOLECYSTECTOMY  Sept of 2012 gallstones   COLONOSCOPY  02/27/2012  Procedure: COLONOSCOPY;  Surgeon: Claudis RAYMOND Rivet, MD;  [facility];  Service: Endoscopy;  Laterality: N/A;  225   eptopic pregnancy  2015   rt ovary      removed 03/2010 for 4 pound tumor   TUBAL LIGATION Bilateral 04/2013    SOCIAL HISTORY:  Social History   Socioeconomic History   Marital status: Married    Spouse name: Selinda   Number of children: 2   Years of education: Not on file   Highest  education level: Not on file  Occupational History   Not on file  Tobacco Use   Smoking status: Never   Smokeless tobacco: Never  Vaping Use   Vaping status: Never Used  Substance and Sexual Activity   Alcohol use: No   Drug use: No   Sexual activity: Yes  Other Topics Concern   Not on file  Social History Narrative   Lives at home with husband and children   Right handed   Caffeine: avg of 16 oz soda daily   Social Drivers of Corporate Investment Banker Strain: Not on file  Food Insecurity: No Food Insecurity (01/09/2024)   Received from [facility]   Hunger Vital Sign    Within the past 12 months, you worried that your food would run out before you got the money to buy more.: Never true    Within the past 12 months, the food you bought just didn't last and you didn't have money to get more.: Never true  Transportation Needs: No Transportation Needs (01/09/2024)   Received from [facility] - [facility]    Lack of Transportation (Medical): No    Lack of Transportation (Non-Medical): No  Physical Activity: Not on file  Stress: Not on file  Social Connections: Not on file  Intimate Partner Violence: Not At Risk (01/09/2024)   Received from [facility]   Humiliation, Afraid, Rape, and Kick questionnaire    Within the last year, have you been afraid of your partner or ex-partner?: No    Within the last year, have you been humiliated or emotionally abused in other ways by your partner or ex-partner?: No    Within the last year, have you been kicked, hit, slapped, or otherwise physically hurt by your partner or ex-partner?: No    Within the last year, have you been raped or forced to have any kind of sexual activity by your partner or ex-partner?: No    FAMILY HISTORY:  Family History  Problem Relation Age of Onset   Epilepsy Mother    Fibromyalgia Mother    Epilepsy Brother    Lupus Brother    Crohn's disease Father    COPD Father    Yvone' disease  Maternal Grandmother    Healthy Son    Allergy (severe) Son    Healthy Daughter    Parkinson's disease Maternal Great-grandfather     CURRENT MEDICATIONS:  Outpatient Encounter Medications as of 05/20/2024  Medication Sig Note   albuterol (VENTOLIN HFA) 108 (90 Base) MCG/ACT inhaler Inhale 2 puffs into the lungs every 6 (six) hours as needed.    ALPRAZolam (XANAX) 0.5 MG tablet Take 0.25-0.5 mg by mouth as needed (start of tremor).    fluconazole (DIFLUCAN) 150 MG tablet Take 150 mg by mouth every 30 (thirty) days. 10/09/2019: As needed   FLUoxetine (PROZAC) 10 MG capsule Take 10 mg by mouth daily.    FLUoxetine (PROZAC) 20 MG capsule  Take 20 mg by mouth daily.    ibuprofen (ADVIL) 800 MG tablet Take 800 mg by mouth 3 (three) times daily.    ketoconazole (NIZORAL) 2 % shampoo USE AS BODY WASH AS DIRECTED    lamoTRIgine  (LAMICTAL ) 100 MG tablet Take 100 mg by mouth daily.    levothyroxine  (SYNTHROID ) 75 MCG tablet Take 75 mcg by mouth every morning.    mupirocin ointment (BACTROBAN) 2 % APPLY TO AFFECTED AREA TWICE A DAY    propranolol  (INDERAL ) 20 MG tablet Take 1 tablet (20 mg total) by mouth 2 (two) times daily.    SUMAtriptan (IMITREX) 100 MG tablet 1 tablet at least 2 hours between doses as needed Orally Twice a day    Vitamin D , Ergocalciferol , (DRISDOL ) 1.25 MG (50000 UNIT) CAPS capsule TAKE 1 CAPSULE BY MOUTH ONE TIME PER WEEK    No facility-administered encounter medications on file as of 05/20/2024.    ALLERGIES:  Allergies  Allergen Reactions   Penicillins Rash   Casein Other (See Comments)    G.I. Upset   Augmentin [Amoxicillin-Pot Clavulanate] Swelling and Rash    LABORATORY DATA:  I have reviewed the labs as listed.  CBC    Component Value Date/Time   WBC 11.7 (H) 04/13/2024 1254   RBC 4.26 04/13/2024 1254   HGB 12.5 04/13/2024 1254   HGB 13.2 07/30/2023 1535   HCT 39.4 04/13/2024 1254   HCT 41.2 07/30/2023 1535   PLT 388 04/13/2024 1254   PLT 379 07/30/2023  1535   MCV 92.5 04/13/2024 1254   MCV 91 07/30/2023 1535   MCH 29.3 04/13/2024 1254   MCHC 31.7 04/13/2024 1254   RDW 14.0 04/13/2024 1254   RDW 14.2 07/30/2023 1535   LYMPHSABS 2.4 04/13/2024 1254   MONOABS 0.5 04/13/2024 1254   EOSABS 0.3 04/13/2024 1254   BASOSABS 0.1 04/13/2024 1254      Latest Ref Rng & Units 07/30/2023    3:36 PM 05/25/2022    4:38 PM 05/25/2022    4:09 PM  CMP  Glucose 70 - 99 mg/dL 91   94   BUN 6 - 24 mg/dL 8   7   Creatinine 9.42 - 1.00 mg/dL 9.27   9.47   Sodium 865 - 144 mmol/L 139   136   Potassium 3.5 - 5.2 mmol/L 4.8   3.5   Chloride 96 - 106 mmol/L 102   100   CO2 20 - 29 mmol/L 22   27   Calcium 8.7 - 10.2 mg/dL 9.6   8.7   Total Protein 6.0 - 8.5 g/dL 7.3  7.4    Total Bilirubin 0.0 - 1.2 mg/dL 0.4  0.4    Alkaline Phos 44 - 121 IU/L 97  78    AST 0 - 40 IU/L 25  19    ALT 0 - 32 IU/L 25  19      DIAGNOSTIC IMAGING:  I have independently reviewed the relevant imaging and discussed with the patient.   WRAP UP:  All questions were answered. The patient knows to call the clinic with any problems, questions or concerns.  Medical decision making: ***  Time spent on visit: I spent *** minutes counseling the patient face to face. The total time spent in the appointment was *** minutes and more than 50% was on counseling.  Pleasant CHRISTELLA Barefoot, PA-C  ***

## 2024-05-20 ENCOUNTER — Inpatient Hospital Stay: Attending: Hematology | Admitting: Physician Assistant

## 2024-05-20 ENCOUNTER — Inpatient Hospital Stay

## 2024-05-20 VITALS — BP 121/78 | HR 83 | Temp 98.9°F | Resp 18 | Ht 67.0 in | Wt 276.0 lb

## 2024-05-20 DIAGNOSIS — D72829 Elevated white blood cell count, unspecified: Secondary | ICD-10-CM | POA: Diagnosis not present

## 2024-05-20 DIAGNOSIS — E611 Iron deficiency: Secondary | ICD-10-CM

## 2024-05-20 DIAGNOSIS — Z79899 Other long term (current) drug therapy: Secondary | ICD-10-CM | POA: Diagnosis not present

## 2024-05-20 DIAGNOSIS — M353 Polymyalgia rheumatica: Secondary | ICD-10-CM | POA: Diagnosis not present

## 2024-05-20 DIAGNOSIS — D509 Iron deficiency anemia, unspecified: Secondary | ICD-10-CM | POA: Diagnosis not present

## 2024-05-20 DIAGNOSIS — R232 Flushing: Secondary | ICD-10-CM | POA: Diagnosis not present

## 2024-05-20 DIAGNOSIS — M3500 Sicca syndrome, unspecified: Secondary | ICD-10-CM | POA: Diagnosis not present

## 2024-05-20 DIAGNOSIS — D72828 Other elevated white blood cell count: Secondary | ICD-10-CM | POA: Diagnosis not present

## 2024-05-20 NOTE — Addendum Note (Signed)
 Addended by: LAMON HERTER on: 05/20/2024 02:23 PM   Modules accepted: Orders

## 2024-05-20 NOTE — Patient Instructions (Signed)
 [facility] at [facility] **VISIT SUMMARY & IMPORTANT INSTRUCTIONS **   You were seen today by Pleasant Barefoot PA-C for your follow-up visit.    IRON  DEFICIENCY Your functional iron  deficiency state is likely related to your underlying autoimmune/inflammatory state. At this time, your iron  levels are higher than normal, so we will not give any IV iron . Will recheck labs in 6 months.  I will also include a lab called soluble transferrin receptor with your next labs, as this can be a more accurate way to check iron  and inflammatory states, along with your usual ferritin levels.  FACIAL FLUSHING We will check labs to look for any sort of neuroendocrine tumor that could be causing facial flushing. Blood draw labs today will be for chromogranin A. We will also check 24-hour urine study of 5 HIAA. 24-hour urine kit has been provided for you. FIRST MORNING: Discard first urine of the morning into the toilet. Collect the rest of your urine in the orange jug for the next 24 hours. SECOND MORNING: Collect your first urine of the morning in the jug.  This ends your 24-hour urine collection. **Store the urine jug in the refrigerator but is not being used. Return urine jug to fourth floor front desk as soon as it is completed.   FOLLOW-UP APPOINTMENT: 6 months (phone visit)  ** Thank you for trusting me with your healthcare!  I strive to provide all of my patients with quality care at each visit.  If you receive a survey for this visit, I would be so grateful to you for taking the time to provide feedback.  Thank you in advance!  ~ Maksim Peregoy                                        Dr. Mickiel Davonna Pleasant Barefoot, PA-C          Delon Hope, NP   - - - - - - - - - - - - - - - - - -    Thank you for choosing [facility] at [facility] to provide your oncology and hematology care.  To afford each patient quality time with our provider,  please arrive at least 15 minutes before your scheduled appointment time.   If you have a lab appointment with the Cancer Center please come in thru the Main Entrance and check in at the main information desk.  You need to re-schedule your appointment should you arrive 10 or more minutes late.  We strive to give you quality time with our providers, and arriving late affects you and other patients whose appointments are after yours.  Also, if you no show three or more times for appointments you may be dismissed from the clinic at the providers discretion.     Again, thank you for choosing [facility].  Our hope is that these requests will decrease the amount of time that you wait before being seen by our physicians.       _____________________________________________________________  Should you have questions after your visit to [facility], please contact our office at [phone] and follow the prompts.  Our office hours are 8:00 a.m. and 4:30 p.m. Monday - Friday.  Please note that voicemails left after 4:00 p.m. may not be returned until the following  business day.  We are closed weekends and major holidays.  You do have access to a nurse 24-7, just call the main number to the clinic [phone] and do not press any options, hold on the line and a nurse will answer the phone.    For prescription refill requests, have your pharmacy contact our office and allow 72 hours.

## 2024-05-21 LAB — CHROMOGRANIN A: Chromogranin A (ng/mL): 31.3 ng/mL (ref 0.0–101.8)

## 2024-05-22 ENCOUNTER — Other Ambulatory Visit (HOSPITAL_COMMUNITY): Payer: Self-pay

## 2024-05-22 DIAGNOSIS — M3500 Sicca syndrome, unspecified: Secondary | ICD-10-CM | POA: Diagnosis not present

## 2024-05-22 DIAGNOSIS — R232 Flushing: Secondary | ICD-10-CM

## 2024-05-22 DIAGNOSIS — Z79899 Other long term (current) drug therapy: Secondary | ICD-10-CM | POA: Diagnosis not present

## 2024-05-22 DIAGNOSIS — D509 Iron deficiency anemia, unspecified: Secondary | ICD-10-CM | POA: Diagnosis not present

## 2024-05-22 DIAGNOSIS — D72828 Other elevated white blood cell count: Secondary | ICD-10-CM | POA: Diagnosis not present

## 2024-05-22 DIAGNOSIS — M353 Polymyalgia rheumatica: Secondary | ICD-10-CM | POA: Diagnosis not present

## 2024-05-25 ENCOUNTER — Ambulatory Visit: Payer: Self-pay | Admitting: Physician Assistant

## 2024-05-27 LAB — 5 HIAA, QUANTITATIVE, URINE, 24 HOUR
5-HIAA, Ur: 2.6 mg/L
5-HIAA,Quant.,24 Hr Urine: 2.1 mg/(24.h) (ref 0.0–14.9)
Total Volume: 800

## 2024-05-29 ENCOUNTER — Ambulatory Visit: Payer: Self-pay | Admitting: Allergy & Immunology

## 2024-06-12 ENCOUNTER — Ambulatory Visit: Payer: Self-pay | Admitting: Allergy & Immunology

## 2024-06-17 ENCOUNTER — Ambulatory Visit: Payer: Self-pay | Admitting: Allergy & Immunology

## 2024-06-30 ENCOUNTER — Ambulatory Visit

## 2024-06-30 VITALS — BP 127/82 | HR 87 | Temp 98.0°F | Resp 16 | Ht 66.25 in | Wt 289.8 lb

## 2024-06-30 DIAGNOSIS — L509 Urticaria, unspecified: Secondary | ICD-10-CM | POA: Diagnosis not present

## 2024-06-30 DIAGNOSIS — M3500 Sicca syndrome, unspecified: Secondary | ICD-10-CM | POA: Diagnosis not present

## 2024-06-30 DIAGNOSIS — M797 Fibromyalgia: Secondary | ICD-10-CM

## 2024-06-30 MED ORDER — HYDROXYCHLOROQUINE SULFATE 200 MG PO TABS: 200.0000 mg | ORAL_TABLET | Freq: Two times a day (BID) | ORAL | 0 refills | Status: AC

## 2024-06-30 NOTE — Progress Notes (Signed)
 "  Office Visit Note  Patient: Maureen Yates             Date of Birth: [birth year]           MRN: [id number]             PCP: Lari Elspeth BRAVO, MD Referring: Lari Elspeth BRAVO, MD Visit Date: 06/30/2024 Occupation: Data Unavailable  Subjective:  New Patient (Initial Visit) (Abnormal labs shoulder, hip, and knee pain.)   History of Present Illness: Maureen Yates is a 43 y.o. female    Discussed the use of AI scribe software for clinical note transcription with the patient, who gave verbal consent to proceed.  History of Present Illness Maureen Yates is a 43 year old female with prior hx of polymyalgia rheumatica and Sjogren's syndrome who presents with joint pain and autoimmune symptoms. She was referred by her hematologist for evaluation of potential autoimmune flare due to high ferritin levels and persistent symptoms.  The patient reports that she was diagnosed with polymyalgia rheumatica and seronegative Sjogren's syndrome in 2021 by a previous rheumatologist. She was started on Rayos prednisone, which she reports initially improved her morning stiffness but caused significant side effects, including weight gain and a 'hump' on her neck. Hydroxychloroquine  was also used, which helped her symptoms, but she eventually discontinued both medications due issues with her prior rheumatologist and desire to stop prednisone.  She experiences persistent joint pain, particularly in her hips, shoulders, and elbows, with morning stiffness that improves slightly throughout the day but worsens with activity. Muscle pain is significant, describing that 'hugs hurt', and she has been managing her pain without medication due to previous negative experiences.  She has a history of iron  deficiency requiring infusions but currently presents with high ferritin levels and symptoms of low iron , such as mouth sores and fatigue.  She experiences a facial rash that seems to trigger increased pain in her  joints. She states that her facial rash does have some association with certain foods. Additionally, she has a persistent skin rash since 2019, described as spots that scar and do not heal well, diagnosed as an overreaction to skin yeast. Various treatments have been tried without success.  She has a history of night sweats and occasional mouth dryness but does not require medication for saliva production. She has been on Cymbalta and gabapentin in the past, with Cymbalta causing severe constipation and gabapentin providing some relief for pain.  Family hx includes brother w/ lupus, father w/ crohn's disease, and maternal grandmother with Grave's disease. Patient also has hx of hashimoto's thyroiditis.    Activities of Daily Living:  Patient reports morning stiffness for 1 hour.   Patient Reports nocturnal pain.  Difficulty dressing/grooming: Denies Difficulty climbing stairs: Denies Difficulty getting out of chair: Denies Difficulty using hands for taps, buttons, cutlery, and/or writing: Denies  Review of Systems  Constitutional:  Positive for fatigue.  HENT:  Positive for mouth sores and mouth dryness.   Eyes:  Positive for dryness.  Respiratory:  Positive for shortness of breath.   Cardiovascular:  Negative for chest pain and palpitations.  Gastrointestinal:  Positive for constipation. Negative for blood in stool and diarrhea.  Endocrine: Negative for increased urination.  Genitourinary:  Negative for involuntary urination.  Musculoskeletal:  Positive for joint pain, joint pain, joint swelling, myalgias, muscle weakness, morning stiffness, muscle tenderness and myalgias. Negative for gait problem.  Skin:  Positive for color change and rash. Negative for hair loss and sensitivity to sunlight.  Allergic/Immunologic: Negative for susceptible to infections.  Neurological:  Positive for headaches. Negative for dizziness.  Hematological:  Negative for swollen glands.   Psychiatric/Behavioral:  Positive for sleep disturbance. Negative for depressed mood. The patient is nervous/anxious.     PMFS History:  Patient Active Problem List   Diagnosis Date Noted   Tremor 10/09/2019   Iron  deficiency anemia due to chronic blood loss 03/10/2019   Abdominal pain, epigastric 03/19/2018   Diarrhea 03/19/2018   Hypothyroidism due to Hashimoto's thyroiditis 03/04/2018   Hashimoto's thyroiditis 09/02/2017   Multinodular goiter 11/02/2016   Morbid obesity (HCC) 11/02/2016   Leukocytosis 07/13/2016   Esophageal reflux 02/23/2016   Encounter for other specified aftercare 12/21/2015   Migraine with aura 12/21/2015   Vitamin D  deficiency 05/13/2015   Thoracic or lumbosacral neuritis or radiculitis 11/22/2014   Iron  deficiency anemia 08/20/2014   Anxiety state 08/20/2014   Ectopic pregnancy, 03/26/2014 05/13/2014   Celiac disease 07/10/2013   Cystadenoma of right ovary 07/10/2013   Hair loss 06/30/2013   IBS (irritable bowel syndrome) 04/03/2012   Seasonal allergies 01/31/2012   Anemia 01/31/2012    Past Medical History:  Diagnosis Date   Anxiety    Environmental allergies    GERD (gastroesophageal reflux disease)    Hashimoto's thyroiditis    IBS (irritable bowel syndrome)    Myofascial pain    PMR (polymyalgia rheumatica)    Sjogren syndrome    Tremor    right arm   Tubal ectopic pregnancy     Family History  Problem Relation Age of Onset   Epilepsy Mother    Fibromyalgia Mother    Polymyalgia rheumatica Mother    Crohn's disease Father    COPD Father    Epilepsy Brother    Lupus Brother    Graves' disease Maternal Grandmother    Kidney disease Maternal Grandmother    Skin cancer Maternal Grandfather    Healthy Daughter    Allergy (severe) Son    Parkinson's disease Maternal Great-grandfather    Past Surgical History:  Procedure Laterality Date   CHOLECYSTECTOMY  Sept of 2012 gallstones   COLONOSCOPY  02/27/2012   Procedure: COLONOSCOPY;   Surgeon: Claudis RAYMOND Rivet, MD;  [facility]: [facility];  Service: Endoscopy;  Laterality: N/A;  225   eptopic pregnancy  2015   rt ovary      removed 03/2010 for 4 pound tumor   TUBAL LIGATION Bilateral 04/2013   Social History[1] Social History   Social History Narrative   Lives at home with husband and children   Right handed   Caffeine: avg of 16 oz soda daily      There is no immunization history on file for this patient.   Objective: Vital Signs: BP 127/82 (BP Location: Right Arm, Patient Position: Sitting, Cuff Size: Large)   Pulse 87   Temp 98 F (36.7 C)   Resp 16   Ht 5' 6.25 (1.683 m)   Wt 289 lb 12.8 oz (131.5 kg)   LMP 06/02/2024   BMI 46.42 kg/m    Physical Exam Vitals and nursing note reviewed.  HENT:     Head: Normocephalic and atraumatic.     Nose: Nose normal.  Eyes:     Conjunctiva/sclera: Conjunctivae normal.     Pupils: Pupils are equal, round, and reactive to light.  Pulmonary:     Effort: Pulmonary effort is normal. No respiratory distress.  Musculoskeletal:     Comments: Tenderness to palpation of multiple joints of  b/l hands, knees, ankles, and feet; no synovitis. Tenderness to palpation of proximal large muscle groups b/l UE and LE  Skin:    General: Skin is warm and dry.  Neurological:     Mental Status: She is alert. Mental status is at baseline.  Psychiatric:        Mood and Affect: Mood normal.        Behavior: Behavior normal.      Musculoskeletal Exam:    Investigation: No additional findings.  Imaging: No results found.  Recent Labs: Lab Results  Component Value Date   WBC 11.7 (H) 04/13/2024   HGB 12.5 04/13/2024   PLT 388 04/13/2024   NA 139 07/30/2023   K 4.8 07/30/2023   CL 102 07/30/2023   CO2 22 07/30/2023   GLUCOSE 91 07/30/2023   BUN 8 07/30/2023   CREATININE 0.72 07/30/2023   BILITOT 0.4 07/30/2023   ALKPHOS 97 07/30/2023   AST 25 07/30/2023   ALT 25 07/30/2023   PROT 7.3 07/30/2023   ALBUMIN 4.6  07/30/2023   CALCIUM 9.6 07/30/2023   GFRAA >60 12/08/2019    Speciality Comments: No specialty comments available.  Procedures:  No procedures performed Allergies: Penicillins, Casein, and Augmentin [amoxicillin-pot clavulanate]   Assessment / Plan:     Visit Diagnoses:  Sjogren's syndrome, with unspecified organ involvement? Patient with prior dx of seronegative Sjogren's syndrome w/ sicca symptoms (confirmed by ophthalmology and dentist) w/ significant improve on prednisone and HCQ. Although it is highly unusual to have a seronegative presentation, her improvement on HCQ do suggest an underlying inflammatory process for some of her symptoms. Some of her symptoms would suggest more of a UCTD (oral ulcers, skin manifestations).   Will restart HCQ at weight based dosing today of 400mg  daily. Hydroxychloroquine  typically is very well tolerated. The most common side effects are nausea and diarrhea, which often improve with time. Less common side effects include rash, hair changes, and muscle weakness. Rarely, hydroxychloroquine  can lead to a pigmented retinopathy, cardiomyopathy, myopathy, arrhythmias due to prolonged QTc.  Discussed with patient the need for annual eye exams. Discussed recommendation is for a baseline eye exam within the first year of use, then annually after 5 years of starting HCQ.   Patient was given handout on Plaquenil  and encouraged to ask questions.   Will obtain ANA, Sjogren's syndrome antibods(ssa + ssb), C3 and C4, Protein / creatinine ratio, urine, RNP Antibody, Anti-DNA antibody, double-stranded, Anti-Smith antibody, IgG, IgA, IgM, Kappa/lambda light chains, IFE Interpretation, Protein Electrophoresis, (serum), Sedimentation rate, C-reactive protein  Fibromyalgia The patient notes constant fatigue and widespread pai w/ allodynia. This is consistent with a diagnosis of fibromyalgia. Fibromyalgia is a chronic pain disorder, thought to be a disorder of pain  regulation, and often classified as a form of central sensitization. Fibromyalgia is characterized by widespread musculoskeletal pain, accompanied by fatigue, cognitive disturbances, psychiatric symptoms, headaches, paresthesia's, and multiple somatic symptoms. Fibromyalgia is not an inflammatory or autoimmune condition, and immunosuppressive therapy does not treat fibromyalgia. Treatment is both nonpharmacological and pharmacological in nature, and should focus on sleep hygiene, graded exercise programs, pharmacologic therapy (options including TCA (amitriptyline), SNRIs (duloxetine, milnacipran), flexeril, gabapentin/lyrica, naltrexone, TENS), pyschosocial therapy i.e. CBT. The patient will likely benefit from multidisciplinary care by PCP, PT, integrative medicine, psychiatry, and pain management. These recommendations will be forwarded to patient's PCP.  Urticaria Facial flushing  Plan: Tryptase  High risk medication use Patient starting HCQ for Sjogren's syndrome/UCTD. Will obtain toxicity labs (CBC, AST, ALT,  Cr) q6 months, next due at visit 09/2024.  Orders: Orders Placed This Encounter  Procedures   Tryptase   ANA   Sjogren's syndrome antibods(ssa + ssb)   C3 and C4   Protein / creatinine ratio, urine   RNP Antibody   Anti-DNA antibody, double-stranded   Anti-Smith antibody   IgG, IgA, IgM   Kappa/lambda light chains   IFE Interpretation   Protein Electrophoresis, (serum)   Sedimentation rate   C-reactive protein   Meds ordered this encounter  Medications   hydroxychloroquine  (PLAQUENIL ) 200 MG tablet    Sig: Take 1 tablet (200 mg total) by mouth 2 (two) times daily.    Dispense:  180 tablet    Refill:  0    I personally spent a total of 60 minutes in the care of the patient today including preparing to see the patient, getting/reviewing separately obtained history, performing a medically appropriate exam/evaluation, counseling and educating, placing orders, documenting  clinical information in the EHR, independently interpreting results, and communicating results.   Follow-Up Instructions: No follow-ups on file.   Asberry Claw, DO  Note - This record has been created using Animal nutritionist.  Chart creation errors have been sought, but may not always  have been located. Such creation errors do not reflect on  the standard of medical care.      [1]  Social History Tobacco Use   Smoking status: Never    Passive exposure: Past   Smokeless tobacco: Never  Vaping Use   Vaping status: Never Used  Substance Use Topics   Alcohol use: No   Drug use: No   "

## 2024-06-30 NOTE — Patient Instructions (Signed)
 [name]

## 2024-07-03 ENCOUNTER — Ambulatory Visit

## 2024-07-04 LAB — IGG, IGA, IGM
IgG (Immunoglobin G), Serum: 984 mg/dL (ref 600–1640)
IgM, Serum: 73 mg/dL (ref 50–300)
Immunoglobulin A: 153 mg/dL (ref 47–310)

## 2024-07-04 LAB — ANTI-SMITH ANTIBODY: ENA SM Ab Ser-aCnc: <1 AI

## 2024-07-04 LAB — PROTEIN / CREATININE RATIO, URINE
Creatinine, Urine: 137 mg/dL (ref 20–275)
Protein/Creat Ratio: 95 mg/g{creat} (ref 24–184)
Protein/Creatinine Ratio: 0.095 mg/mg{creat} (ref 0.024–0.184)
Total Protein, Urine: 13 mg/dL (ref 5–24)

## 2024-07-04 LAB — PROTEIN ELECTROPHORESIS, SERUM
Albumin ELP: 4.4 g/dL (ref 3.8–4.8)
Alpha 1: 0.5 g/dL — ABNORMAL HIGH (ref 0.2–0.3)
Alpha 2: 1 g/dL — ABNORMAL HIGH (ref 0.5–0.9)
Beta 2: 0.5 g/dL (ref 0.2–0.5)
Beta Globulin: 0.5 g/dL (ref 0.4–0.6)
Gamma Globulin: 1 g/dL (ref 0.8–1.7)
Total Protein: 7.9 g/dL (ref 6.1–8.1)

## 2024-07-04 LAB — KAPPA/LAMBDA LIGHT CHAINS
Kappa free light chain: 17.3 mg/L (ref 3.3–19.4)
Kappa:Lambda Ratio: 1.52 (ref 0.26–1.65)
Lambda Free Lght Chn: 11.4 mg/L (ref 5.7–26.3)

## 2024-07-04 LAB — C-REACTIVE PROTEIN: CRP: 15.5 mg/L — ABNORMAL HIGH

## 2024-07-04 LAB — SEDIMENTATION RATE: Sed Rate: 39 mm/h — ABNORMAL HIGH (ref 0–20)

## 2024-07-04 LAB — C3 AND C4
C3 Complement: 217 mg/dL — ABNORMAL HIGH (ref 83–193)
C4 Complement: 43 mg/dL (ref 15–57)

## 2024-07-04 LAB — RNP ANTIBODY: Ribonucleic Protein(ENA) Antibody, IgG: <1 AI

## 2024-07-04 LAB — SJOGREN'S SYNDROME ANTIBODS(SSA + SSB)
SSA (Ro) (ENA) Antibody, IgG: <1 AI
SSB (La) (ENA) Antibody, IgG: <1 AI

## 2024-07-04 LAB — TRYPTASE: Tryptase: 16 ug/L — ABNORMAL HIGH

## 2024-07-04 LAB — ANTI-DNA ANTIBODY, DOUBLE-STRANDED: ds DNA Ab: <1 [IU]/mL

## 2024-07-04 LAB — ANA: Anti Nuclear Antibody (ANA): NEGATIVE

## 2024-07-04 LAB — IFE INTERPRETATION

## 2024-07-06 ENCOUNTER — Encounter: Payer: Self-pay | Admitting: *Deleted

## 2024-07-07 ENCOUNTER — Ambulatory Visit: Payer: Self-pay

## 2024-07-07 DIAGNOSIS — R21 Rash and other nonspecific skin eruption: Secondary | ICD-10-CM

## 2024-07-07 DIAGNOSIS — R748 Abnormal levels of other serum enzymes: Secondary | ICD-10-CM

## 2024-07-07 DIAGNOSIS — R232 Flushing: Secondary | ICD-10-CM

## 2024-07-13 ENCOUNTER — Other Ambulatory Visit: Payer: Self-pay | Admitting: *Deleted

## 2024-07-13 ENCOUNTER — Ambulatory Visit: Payer: Self-pay | Admitting: Internal Medicine

## 2024-07-13 ENCOUNTER — Encounter (HOSPITAL_COMMUNITY): Payer: Self-pay | Admitting: Oncology

## 2024-07-13 ENCOUNTER — Encounter: Payer: Self-pay | Admitting: Internal Medicine

## 2024-07-13 VITALS — BP 118/78 | HR 79 | Temp 98.1°F | Ht 66.0 in | Wt 292.5 lb

## 2024-07-13 DIAGNOSIS — J3089 Other allergic rhinitis: Secondary | ICD-10-CM

## 2024-07-13 DIAGNOSIS — D8940 Mast cell activation, unspecified: Secondary | ICD-10-CM

## 2024-07-13 DIAGNOSIS — J4530 Mild persistent asthma, uncomplicated: Secondary | ICD-10-CM | POA: Diagnosis not present

## 2024-07-13 DIAGNOSIS — R21 Rash and other nonspecific skin eruption: Secondary | ICD-10-CM

## 2024-07-13 DIAGNOSIS — E559 Vitamin D deficiency, unspecified: Secondary | ICD-10-CM

## 2024-07-13 MED ORDER — VITAMIN D (ERGOCALCIFEROL) 1.25 MG (50000 UNIT) PO CAPS: ORAL_CAPSULE | ORAL | 4 refills | Status: AC

## 2024-07-13 MED ORDER — QVAR REDIHALER 40 MCG/ACT IN AERB: 2.0000 | INHALATION_SPRAY | Freq: Two times a day (BID) | RESPIRATORY_TRACT | 5 refills | Status: AC

## 2024-07-13 NOTE — Patient Instructions (Addendum)
 Mild Persistent Asthma: - Maintenance inhaler: start Qvar  40mcg 2 puffs twice daily.  Brush and gargle after use.  - Rescue inhaler: Albuterol 2 puffs via spacer or 1 vial via nebulizer every 4-6 hours as needed for respiratory symptoms of cough, shortness of breath, or wheezing Asthma control goals:  Full participation in all desired activities (may need albuterol before activity) Albuterol use two times or less a week on average (not counting use with activity) Cough interfering with sleep two times or less a month Oral steroids no more than once a year No hospitalizations  Other Allergic Rhinitis: - Use nasal saline rinses before nose sprays such as with Neilmed Sinus Rinse.  Use distilled water .   - Use Flonase 2 sprays each nostril daily. Aim upward and outward. - Use Zyrtec  10 mg twice daily.   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   - If still having issues after 1-2 weeks, add Pepcid  20mg  twice daily and continue Zyrtec  10mg  twice daily.  - Will recheck tryptase and obtain KIT mutation     Rashes - Does not look like cutaneous manifestations of mast cell disease.  Will send to [facility] for second opinion.

## 2024-07-13 NOTE — Progress Notes (Signed)
 "  NEW PATIENT  Date of Service/Encounter:  07/13/2024  Consult requested by: Lari Elspeth BRAVO, MD   Subjective:   Maureen Yates (DOB: [birth year]) is a 44 y.o. female who presents to the clinic on 07/13/2024 with a chief complaint of Mast Cell  and Asthma (During the fall/winter) .    History obtained from: chart review and patient.   Mast Cell Concern: Notes episodes of facial flushing, joint pain and itching.  Notes it all over her body and moves around.   Also seen by Cardiology for palpitations with rare PVC/PACs, on propranolol  and has helped.  Has history of asthma, seen below.   In 2014-2015, notes having trouble with stool urgency after eating and loose stools; improved with gluten limitation.   Also notes a rash that she calls sores all over her body, not itchy but burning.  She denies scratching them.  They over time become smaller and drier.  No family hx of mast cell disease.   No hives/swelling. 5HIAA Urine was normal, elevated ESR/CRP, elevated tryptase Followed by Rheumatology for Sjogren's and is Plaquenil .  Takes Zyrtec  daily since September and then in Summer does it PRN.  This has not helped much with flushing/joint pain/itching.   No history of anaphylaxis.   Asthma:  Diagnosed around age 73.  Had bronchitis/illness at the time and since then in Fall/Winter has trouble with coughing and chest tightness requiring Albuterol use almost daily.  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 Never been on a controller inhaler.  Using rescue inhaler: almost daily  Limitations to daily activity: mild 0 ED visits/UC visits and 0 oral steroids in the past year 0 number of lifetime hospitalizations, 0 number of lifetime intubations.  Identified Triggers: respiratory illness and cold air Prior PFTs or spirometry: none Previously used therapies: none  Current regimen:  Maintenance: none Rescue: Albuterol 2 puffs q4-6 hrs  PRN  Rhinitis:  Started since childhood.  Symptoms include: nasal congestion, rhinorrhea, post nasal drainage, and sneezing globus sensation, ear fullness, Occurs seasonally-Fall/Spring  Potential triggers: not sure  Treatments tried:  Zyrtec    Previous allergy testing: yes 2008,  History of sinus surgery: no Nonallergic triggers: wood stoves   Reviewed:  06/30/2024: seen by Dr Luba Rheum for Sjogrens, seronegative.  Restarted on HCQ.  For flushing, obtain Tryptase.  06/2024: tryptase 16  05/20/2024: seen by [facility] for flushing, iron  deficiency, checking chromogranin A+ 24 hr urine 5HIAA, normal. Otes hx of leukocytosis, reactive likely from CTD.  On IV iron .   Past Medical History: Past Medical History:  Diagnosis Date   Anxiety    Asthma    Environmental allergies    GERD (gastroesophageal reflux disease)    Hashimoto's thyroiditis    IBS (irritable bowel syndrome)    Myofascial pain    PMR (polymyalgia rheumatica)    Sjogren syndrome    Tremor    right arm   Tubal ectopic pregnancy    Past Surgical History: Past Surgical History:  Procedure Laterality Date   CHOLECYSTECTOMY  Sept of 2012 gallstones   COLONOSCOPY  02/27/2012   Procedure: COLONOSCOPY;  Surgeon: Claudis RAYMOND Rivet, MD;  Location: [facility];  Service: Endoscopy;  Laterality: N/A;  225   eptopic pregnancy  2015   rt ovary      removed 03/2010 for 4 pound tumor   TUBAL LIGATION Bilateral 04/2013    Family History: Family History  Problem Relation  Age of Onset   Asthma Mother    Epilepsy Mother    Fibromyalgia Mother    Polymyalgia rheumatica Mother    Crohn's disease Father    COPD Father    Epilepsy Brother    Lupus Brother    Graves' disease Maternal Grandmother    Kidney disease Maternal Grandmother    Skin cancer Maternal Grandfather    Healthy Daughter    Allergy (severe) Son    Food Allergy Son    Parkinson's disease Maternal Great-grandfather     Social History:   Flooring in bedroom: wood Pets: cat Tobacco use/exposure: none Job: hair stylist   Medication List:  Allergies as of 07/13/2024       Reactions   Penicillins Rash   Casein Other (See Comments)   G.I. Upset   Augmentin [amoxicillin-pot Clavulanate] Swelling, Rash        Medication List        Accurate as of July 13, 2024 10:17 AM. If you have any questions, ask your nurse or doctor.          albuterol 108 (90 Base) MCG/ACT inhaler Commonly known as: VENTOLIN HFA Inhale 2 puffs into the lungs every 6 (six) hours as needed.   ALPRAZolam 0.5 MG tablet Commonly known as: XANAX Take 0.25-0.5 mg by mouth as needed (start of tremor).   diclofenac 75 MG EC tablet Commonly known as: VOLTAREN Take 75 mg by mouth 2 (two) times daily.   fluconazole 150 MG tablet Commonly known as: DIFLUCAN Take 150 mg by mouth every 30 (thirty) days.   FLUoxetine 20 MG capsule Commonly known as: PROZAC Take 20 mg by mouth daily.   FLUoxetine 10 MG capsule Commonly known as: PROZAC Take 10 mg by mouth daily.   hydroxychloroquine  200 MG tablet Commonly known as: Plaquenil  Take 1 tablet (200 mg total) by mouth 2 (two) times daily.   ibuprofen 800 MG tablet Commonly known as: ADVIL Take 800 mg by mouth 3 (three) times daily.   Imitrex 100 MG tablet Generic drug: SUMAtriptan 1 tablet at least 2 hours between doses as needed Orally Twice a day   ketoconazole 2 % shampoo Commonly known as: NIZORAL USE AS BODY WASH AS DIRECTED   lamoTRIgine  100 MG tablet Commonly known as: LAMICTAL  Take 100 mg by mouth daily.   levothyroxine  75 MCG tablet Commonly known as: SYNTHROID  Take 75 mcg by mouth every morning.   mupirocin ointment 2 % Commonly known as: BACTROBAN APPLY TO AFFECTED AREA TWICE A DAY   propranolol  20 MG tablet Commonly known as: INDERAL  Take 1 tablet (20 mg total) by mouth 2 (two) times daily.   Qvar  RediHaler 40 MCG/ACT inhaler Generic drug:  beclomethasone Inhale 2 puffs into the lungs 2 (two) times daily. Started by: Arleta Blanch, MD   Tylenol  325 MG tablet Generic drug: acetaminophen  Tylenol ( 325MG  Oral  PRN ) Active -Hx Entry Oral PRN; Duration: 0   Vitamin D  (Ergocalciferol ) 1.25 MG (50000 UNIT) Caps capsule Commonly known as: DRISDOL  TAKE 1 CAPSULE BY MOUTH ONE TIME PER WEEK         REVIEW OF SYSTEMS: Pertinent positives and negatives discussed in HPI.   Objective:   Physical Exam: BP 118/78   Pulse 79   Temp 98.1 F (36.7 C)   Ht 5' 6 (1.676 m)   Wt 292 lb 8 oz (132.7 kg)   LMP 06/02/2024   SpO2 97%   BMI 47.21 kg/m  Body mass index is 47.21  kg/m. GEN: alert, well developed HEENT: clear conjunctiva, nose with + mild inferior turbinate hypertrophy, pink nasal mucosa, slight clear rhinorrhea, + cobblestoning HEART: regular rate and rhythm, no murmur LUNGS: clear to auscultation bilaterally, no coughing, unlabored respiration ABDOMEN: soft, non distended  SKIN: areas with red papules, some patches with crusting/drying  Spirometry:  Tracings reviewed. Her effort: Good reproducible efforts. FVC: 2.68L, 68% predicted FEV1: 2.32L, 73% predicted FEV1/FVC ratio: 87% Interpretation: Spirometry consistent with possible restrictive disease.  Please see scanned spirometry results for details.  Assessment:   1. Other allergic rhinitis   2. Mast cell activation syndrome   3. Mild persistent asthma without complication     Plan/Recommendations:  Mild Persistent Asthma: - Spirometry today without obstruction, possibly restriction likely due to obesity/effort. Uncontrolled with frequent Albuterol use, will start ICS.  - Maintenance inhaler: start Qvar  40mcg 2 puffs twice daily.  Brush and gargle after use.  - Rescue inhaler: Albuterol 2 puffs via spacer or 1 vial via nebulizer every 4-6 hours as needed for respiratory symptoms of cough, shortness of breath, or wheezing Asthma control goals:  Full  participation in all desired activities (may need albuterol before activity) Albuterol use two times or less a week on average (not counting use with activity) Cough interfering with sleep two times or less a month Oral steroids no more than once a year No hospitalizations  Other Allergic Rhinitis: - Due to turbinate hypertrophy, seasonal symptoms, asthma and unresponsive to over the counter meds, will perform testing to identify aeroallergen triggers.   - Use nasal saline rinses before nose sprays such as with Neilmed Sinus Rinse.  Use distilled water .   - Use Flonase 2 sprays each nostril daily. Aim upward and outward. - Use Zyrtec  10 mg twice daily.   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  Followed by Rheum for Sjogrens on Plaquenil .   - Increase to Zyrtec  10mg  twice daily.   - If still having issues after 1-2 weeks, add Pepcid  20mg  twice daily and continue Zyrtec  10mg  twice daily.  - Will recheck tryptase and obtain KIT mutation   Rashes - Does not look like cutaneous manifestations of mast cell disease.  Will send to [facility] for second opinion. - Possibly prurigo nodularis? But it is odd that its not itching, although she does note some burning.   Return in about 6 weeks (around 08/24/2024).  Arleta Blanch, MD [facility]        "

## 2024-07-20 ENCOUNTER — Ambulatory Visit: Payer: Self-pay | Admitting: Internal Medicine

## 2024-07-20 ENCOUNTER — Encounter

## 2024-07-20 LAB — KIT (D816V) DIGITAL PCR

## 2024-07-21 LAB — ALLERGEN PROFILE WITH TOTAL IGE, RESPIRATORY-AREA 2
Alternaria Alternata IgE: <0.1 kU/L
Aspergillus Fumigatus IgE: <0.1 kU/L
Bermuda Grass IgE: <0.1 kU/L
Cat Dander IgE: 0.13 kU/L — AB
Cedar, Mountain IgE: <0.1 kU/L
Cladosporium Herbarum IgE: <0.1 kU/L
Cockroach, German IgE: <0.1 kU/L
Common Silver Birch IgE: <0.1 kU/L
Cottonwood IgE: <0.1 kU/L
D Farinae IgE: 0.57 kU/L — AB
D Pteronyssinus IgE: 0.41 kU/L — AB
Dog Dander IgE: <0.1 kU/L
Elm, American IgE: <0.1 kU/L
IgE (Immunoglobulin E), Serum: 72 [IU]/mL (ref 6–495)
Johnson Grass IgE: <0.1 kU/L
Maple/Box Elder IgE: <0.1 kU/L
Mouse Urine IgE: <0.1 kU/L
Oak, White IgE: <0.1 kU/L
Pecan, Hickory IgE: <0.1 kU/L
Penicillium Chrysogen IgE: <0.1 kU/L
Pigweed, Rough IgE: <0.1 kU/L
Ragweed, Short IgE: <0.1 kU/L
Sheep Sorrel IgE Qn: <0.1 kU/L
Timothy Grass IgE: <0.1 kU/L
White Mulberry IgE: <0.1 kU/L

## 2024-07-21 LAB — KIT (D816V) DIGITAL PCR: CKIT Result: NEGATIVE

## 2024-07-21 LAB — TRYPTASE: Tryptase: 15.3 ug/L — ABNORMAL HIGH (ref 2.2–13.2)

## 2024-07-23 ENCOUNTER — Encounter

## 2024-07-24 ENCOUNTER — Ambulatory Visit: Admitting: Nurse Practitioner

## 2024-08-06 NOTE — Telephone Encounter (Signed)
 I think we need to call for more test kits

## 2024-08-24 ENCOUNTER — Ambulatory Visit: Admitting: Allergy & Immunology

## 2024-08-31 ENCOUNTER — Ambulatory Visit: Admitting: Nurse Practitioner

## 2024-09-02 ENCOUNTER — Encounter: Admitting: Rheumatology

## 2024-09-28 ENCOUNTER — Ambulatory Visit

## 2024-09-30 ENCOUNTER — Ambulatory Visit: Admitting: Rheumatology

## 2024-11-11 ENCOUNTER — Inpatient Hospital Stay

## 2024-11-18 ENCOUNTER — Inpatient Hospital Stay: Admitting: Physician Assistant
